# Patient Record
Sex: Male | Born: 1950 | Race: Black or African American | Hispanic: No | Marital: Single | Smoking: Former smoker
Health system: Southern US, Community
[De-identification: ages and names within clinical notes are randomized; demographics above are authoritative.]

## PROBLEM LIST (undated history)

## (undated) DIAGNOSIS — E119 Type 2 diabetes mellitus without complications: Secondary | ICD-10-CM

## (undated) DIAGNOSIS — I10 Essential (primary) hypertension: Secondary | ICD-10-CM

## (undated) DIAGNOSIS — D496 Neoplasm of unspecified behavior of brain: Secondary | ICD-10-CM

## (undated) DIAGNOSIS — I495 Sick sinus syndrome: Secondary | ICD-10-CM

## (undated) HISTORY — DX: Type 2 diabetes mellitus without complications: E11.9

---

## 2013-10-22 DIAGNOSIS — M79673 Pain in unspecified foot: Secondary | ICD-10-CM | POA: Insufficient documentation

## 2013-10-22 DIAGNOSIS — M1612 Unilateral primary osteoarthritis, left hip: Secondary | ICD-10-CM | POA: Insufficient documentation

## 2016-08-18 ENCOUNTER — Telehealth: Payer: Self-pay | Admitting: Gastroenterology

## 2016-08-18 NOTE — Telephone Encounter (Signed)
Left voice message for patient to call and schedule appointment for Hep C, referred by Phineas Realharles Drew with Dr. Servando SnareWohl

## 2016-08-21 NOTE — Telephone Encounter (Signed)
Spoke to patient regarding an appointment from [facility] for Hep C. Patient stated he didn't want to come in because his test was negative. Called [facility] and explained this to them. Spoke to [employer]

## 2017-05-07 ENCOUNTER — Ambulatory Visit
Admission: RE | Admit: 2017-05-07 | Discharge: 2017-05-07 | Disposition: A | Discharge status: Home / Self Care | Payer: Medicare Other | Source: Ambulatory Visit | Attending: Primary Care | Admitting: Primary Care

## 2017-05-07 ENCOUNTER — Other Ambulatory Visit: Payer: Self-pay | Admitting: Primary Care

## 2017-05-07 DIAGNOSIS — R05 Cough: Secondary | ICD-10-CM

## 2017-05-07 DIAGNOSIS — R059 Cough, unspecified: Secondary | ICD-10-CM

## 2017-05-07 DIAGNOSIS — J984 Other disorders of lung: Secondary | ICD-10-CM | POA: Diagnosis not present

## 2018-03-18 ENCOUNTER — Encounter: Payer: Self-pay | Admitting: Urology

## 2018-03-18 ENCOUNTER — Ambulatory Visit: Payer: Medicare Other | Admitting: Urology

## 2018-04-23 ENCOUNTER — Ambulatory Visit
Payer: Medicare Other | Attending: Student in an Organized Health Care Education/Training Program | Admitting: Student in an Organized Health Care Education/Training Program

## 2018-04-23 ENCOUNTER — Other Ambulatory Visit: Payer: Self-pay

## 2018-04-23 ENCOUNTER — Encounter: Payer: Self-pay | Admitting: Student in an Organized Health Care Education/Training Program

## 2018-04-23 VITALS — BP 142/77 | HR 77 | Temp 98.0°F | Resp 16 | Ht 71.0 in | Wt 185.0 lb

## 2018-04-23 DIAGNOSIS — G8929 Other chronic pain: Secondary | ICD-10-CM | POA: Diagnosis not present

## 2018-04-23 DIAGNOSIS — M542 Cervicalgia: Secondary | ICD-10-CM | POA: Diagnosis not present

## 2018-04-23 DIAGNOSIS — M7918 Myalgia, other site: Secondary | ICD-10-CM | POA: Diagnosis not present

## 2018-04-23 DIAGNOSIS — M5136 Other intervertebral disc degeneration, lumbar region: Secondary | ICD-10-CM | POA: Diagnosis not present

## 2018-04-23 DIAGNOSIS — G894 Chronic pain syndrome: Secondary | ICD-10-CM | POA: Insufficient documentation

## 2018-04-23 DIAGNOSIS — Z79899 Other long term (current) drug therapy: Secondary | ICD-10-CM | POA: Diagnosis not present

## 2018-04-23 DIAGNOSIS — Z87891 Personal history of nicotine dependence: Secondary | ICD-10-CM | POA: Diagnosis not present

## 2018-04-23 DIAGNOSIS — M25511 Pain in right shoulder: Secondary | ICD-10-CM | POA: Diagnosis not present

## 2018-04-23 DIAGNOSIS — M1612 Unilateral primary osteoarthritis, left hip: Secondary | ICD-10-CM | POA: Diagnosis not present

## 2018-04-23 DIAGNOSIS — Z7982 Long term (current) use of aspirin: Secondary | ICD-10-CM | POA: Insufficient documentation

## 2018-04-23 MED ORDER — TIZANIDINE HCL 4 MG PO TABS: 4.0000 mg | ORAL_TABLET | Freq: Every day | ORAL | 1 refills | Status: AC

## 2018-04-23 MED ORDER — DICLOFENAC SODIUM 75 MG PO TBEC: 75.0000 mg | DELAYED_RELEASE_TABLET | Freq: Two times a day (BID) | ORAL | 1 refills | Status: DC

## 2018-04-23 NOTE — Patient Instructions (Addendum)
You been prescription for Voltaren 75mg  and Zanaflex 4mg  to pharmacy. Labs today. X-rays to be done next.   Thank you for seeing us!

## 2018-04-23 NOTE — Progress Notes (Signed)
Safety precautions to be maintained throughout the outpatient stay will include: orient to surroundings, keep bed in low position, maintain call bell within reach at all times, provide assistance with transfer out of bed and ambulation.  

## 2018-04-23 NOTE — Progress Notes (Signed)
Patient's Name: Andres Torres  MRN: [id number]  Referring Provider: Linus Salmons, [place]*  DOB: 03-21-1951  PCP: Freddy Finner, NP  DOS: 04/23/2018  Note by: Gillis Santa, MD  Service setting: Ambulatory outpatient  Specialty: Interventional Pain Management  Location: [facility] (AMB) Pain Management Facility  Visit type: Initial Patient Evaluation  Patient type: New Patient   Primary Reason(s) for Visit: Encounter for initial evaluation of one or more chronic problems (new to examiner) potentially causing chronic pain, and posing a threat to normal musculoskeletal function. (Level of risk: High) CC: neck pain, back pain, hip pain (left)  HPI  [name] is a 67 y.o. year old, male patient, who comes today to see [place] for the first time for an initial evaluation of his chronic pain. He has Localized osteoarthrosis of left hip; Foot arch pain; Chronic pain syndrome; Lumbar degenerative disc disease; Chronic right shoulder pain; Musculoskeletal pain; Pharmacologic therapy; and Neck pain on their problem list. Today he comes in for evaluation of his neck pain, back pain, hip pain (left)  Pain Assessment: Location: Posterior Neck Radiating: radiates from back of neck to right shoulder and right arm. Pain also left hip. lower back. bottom of feet.  Onset: More than a month ago Duration: Chronic pain Quality: Burning, Constant, Sharp, Shooting, Throbbing, Tingling Severity: 7 /10 (subjective, self-reported pain score)  Note: Reported level is inconsistent with clinical observations.                         When using our objective Pain Scale, levels between 6 and 10/10 are said to belong in an emergency room, as it progressively worsens from a 6/10, described as severely limiting, requiring emergency care not usually available at an outpatient pain management facility. At a 6/10 level, communication becomes difficult and requires great effort. Assistance to reach the emergency department may be required.  Facial flushing and profuse sweating along with potentially dangerous increases in heart rate and blood pressure will be evident. Effect on ADL: Bending, climbling, intercourse, kneeling, lifting, motion, squatting, stooping, and working.  Timing: Constant Modifying factors: acupuncture, medications, resting  BP: (!) 142/77  HR: 77  Onset and Duration: Sudden and Gradual Cause of pain: Unknown Severity: Getting worse, NAS-11 at its worse: 10/10, NAS-11 at its best: 7/10, NAS-11 now: 8/10 and NAS-11 on the average: 8/10 Timing: Night, During activity or exercise and After a period of immobility Aggravating Factors: Bending, Climbing, Intercourse (sex), Kneeling, Lifiting, Motion, Squatting, Stooping , Twisting, Walking and Working Alleviating Factors: Acupuncture, Medications and Resting Associated Problems: Depression, Dizziness, Erectile dysfunction, Impotence, Inability to concentrate, Swelling, Temperature changes, Tingling, Weakness, Pain that wakes patient up and Pain that does not allow patient to sleep Quality of Pain: Burning, Deep, Disabling, Distressing, Exhausting, Nagging, Pulsating, Punishing, Sharp, Shooting, Throbbing and Tingling Previous Examinations or Tests: MRI scan, Orthopedic evaluation and Chiropractic evaluation Previous Treatments: Chiropractic manipulations, Narcotic medications and Steroid treatments by mouth  The patient comes into the clinics today for the first time for a chronic pain management evaluation.   67 year old male with a history of right shoulder, right arm, right hand pain, left hip pain, low back pain as well.  Patient has been previously seen by orthopedics as well as physical therapy for his left hip pain.  He has been told that he needs left hip replacement surgery but patient wants to avoid surgery.  He has tried physical therapy as well as a left ultrasound-guided hip steroid injection  which did provide him with pain relief for approximately 6  months.  He did have insomnia for approximately 3 days after injection.  Patient does have type 2 diabetes, not on insulin.  Patient is interested in alternative therapy.  He has been taking tramadol 50 mg twice daily to 3 times daily as needed which he states is not effective.  Patient is tried various over-the-counter analgesics including Tylenol, ibuprofen without significant benefit.  Patient denies any lower externally weakness or bowel or bladder dysfunction.  Today I took the time to provide the patient with information regarding my pain practice. The patient was informed that my practice is divided into two sections: an interventional pain management section, as well as a completely separate and distinct medication management section. I explained that I have procedure days for my interventional therapies, and evaluation days for follow-ups and medication management. Because of the amount of documentation required during both, they are kept separated. This means that there is the possibility that he may be scheduled for a procedure on one day, and medication management the next. I have also informed him that because of staffing and facility limitations, I no longer take patients for medication management only. To illustrate the reasons for this, I gave the patient the example of surgeons, and how inappropriate it would be to refer a patient to his/her care, just to write for the post-surgical antibiotics on a surgery done by a different surgeon.   Because interventional pain management is my board-certified specialty, the patient was informed that joining my practice means that they are open to any and all interventional therapies. I made it clear that this does not mean that they will be forced to have any procedures done. What this means is that I believe interventional therapies to be essential part of the diagnosis and proper management of chronic pain conditions. Therefore, patients not interested in  these interventional alternatives will be better served under the care of a different practitioner.  The patient was also made aware of my Comprehensive Pain Management Safety Guidelines where by joining my practice, they limit all of their nerve blocks and joint injections to those done by our practice, for as long as we are retained to manage their care.   Historic Controlled Substance Pharmacotherapy Review  PMP and historical list of controlled substances: Tramadol 50 mg 4 times daily as needed, quantity 120 a month MME/day: 40 mg/day Medications: The patient did not bring the medication(s) to the appointment, as requested in our "New Patient Package" Pharmacodynamics: Desired effects: Analgesia: The patient reports <50% benefit. Reported improvement in function: The patient reports medication allows him to accomplish basic ADLs. Clinically meaningful improvement in function (CMIF): Sustained CMIF goals met Perceived effectiveness: Described as ineffective and would like to make some changes Undesirable effects: Side-effects or Adverse reactions: None reported Historical Monitoring: The patient  reports that he has current or past drug history. List of all UDS Test(s): No results found for: MDMA, COCAINSCRNUR, [place], [place], CANNABQUANT, [place], [place] List of other Serum/Urine Drug Screening Test(s):  No results found for: AMPHSCRSER, BARBSCRSER, BENZOSCRSER, COCAINSCRSER, COCAINSCRNUR, PCPSCRSER, PCPQUANT, THCSCRSER, THCU, CANNABQUANT, OPIATESCRSER, OXYSCRSER, PROPOXSCRSER, ETH Historical Background Evaluation: [place] PMP: Six (6) year initial data search conducted.             [place] Department of public safety, offender search: Editor, commissioning Information) Non-contributory Risk Assessment Profile: Aberrant behavior: None observed or detected today Risk factors for fatal opioid overdose: None identified today Fatal overdose hazard ratio (  HR): Calculation deferred Non-fatal overdose hazard ratio  (HR): Calculation deferred Risk of opioid abuse or dependence: 0.7-3.0% with doses ? 36 MME/day and 6.1-26% with doses ? 120 MME/day. Substance use disorder (SUD) risk level: Moderate Opioid risk tool (ORT) (Total Score): 7 Opioid Risk Tool - 04/23/18 0924      Family History of Substance Abuse   Alcohol  Positive Male    Illegal Drugs  Negative    Rx Drugs  Negative      Personal History of Substance Abuse   Alcohol  Negative    Illegal Drugs  Positive Male or Male    Rx Drugs  Negative      Age   Age between 53-45 years   No      History of Preadolescent Sexual Abuse   History of Preadolescent Sexual Abuse  Negative or Male      Psychological Disease   Psychological Disease  Negative    Depression  Negative      Total Score   Opioid Risk Tool Scoring  7    Opioid Risk Interpretation  Moderate Risk      ORT Scoring interpretation table:  Score <3 = Low Risk for SUD  Score between 4-7 = Moderate Risk for SUD  Score >8 = High Risk for Opioid Abuse   PHQ-2 Depression Scale:  Total score: 0  PHQ-2 Scoring interpretation table: (Score and probability of major depressive disorder)  Score 0 = No depression  Score 1 = 15.4% Probability  Score 2 = 21.1% Probability  Score 3 = 38.4% Probability  Score 4 = 45.5% Probability  Score 5 = 56.4% Probability  Score 6 = 78.6% Probability   PHQ-9 Depression Scale:  Total score: 0  PHQ-9 Scoring interpretation table:  Score 0-4 = No depression  Score 5-9 = Mild depression  Score 10-14 = Moderate depression  Score 15-19 = Moderately severe depression  Score 20-27 = Severe depression (2.4 times higher risk of SUD and 2.89 times higher risk of overuse)   Pharmacologic Plan: As per protocol, I have not taken over any controlled substance management, pending the results of ordered tests and/or consults.            Initial impression: Pending review of available data and ordered tests.  Meds   Current Outpatient Medications:  .   aspirin EC 81 MG tablet, Take by mouth., Disp: , Rfl:  .  traMADol (ULTRAM) 50 MG tablet, tramadol 50 mg tablet, Disp: , Rfl:  .  VENTOLIN HFA 108 (90 Base) MCG/ACT inhaler, , Disp: , Rfl:  .  diclofenac (VOLTAREN) 75 MG EC tablet, Take 1 tablet (75 mg total) by mouth 2 (two) times daily., Disp: 60 tablet, Rfl: 1 .  tiZANidine (ZANAFLEX) 4 MG tablet, Take 1 tablet (4 mg total) by mouth at bedtime., Disp: 30 tablet, Rfl: 1    ROS  Cardiovascular: No reported cardiovascular signs or symptoms such as High blood pressure, coronary artery disease, abnormal heart rate or rhythm, heart attack, blood thinner therapy or heart weakness and/or failure Pulmonary or Respiratory: Shortness of breath and Smoking Neurological: No reported neurological signs or symptoms such as seizures, abnormal skin sensations, urinary and/or fecal incontinence, being born with an abnormal open spine and/or a tethered spinal cord Review of Past Neurological Studies: No results found for this or any previous visit. Psychological-Psychiatric: No reported psychological or psychiatric signs or symptoms such as difficulty sleeping, anxiety, depression, delusions or hallucinations (schizophrenial), mood swings (bipolar disorders)  or suicidal ideations or attempts Gastrointestinal: No reported gastrointestinal signs or symptoms such as vomiting or evacuating blood, reflux, heartburn, alternating episodes of diarrhea and constipation, inflamed or scarred liver, or pancreas or irrregular and/or infrequent bowel movements Genitourinary: No reported renal or genitourinary signs or symptoms such as difficulty voiding or producing urine, peeing blood, non-functioning kidney, kidney stones, difficulty emptying the bladder, difficulty controlling the flow of urine, or chronic kidney disease Hematological: No reported hematological signs or symptoms such as prolonged bleeding, low or poor functioning platelets, bruising or bleeding easily,  hereditary bleeding problems, low energy levels due to low hemoglobin or being anemic Endocrine: High blood sugar controlled without the use of insulin (NIDDM) Rheumatologic: No reported rheumatological signs and symptoms such as fatigue, joint pain, tenderness, swelling, redness, heat, stiffness, decreased range of motion, with or without associated rash Musculoskeletal: Negative for myasthenia gravis, muscular dystrophy, multiple sclerosis or malignant hyperthermia Work History: Disabled  Allergies  Mr. Coppola has No Known Allergies.  Laboratory Chemistry  Inflammation Markers (CRP: Acute Phase) (ESR: Chronic Phase) No results found for: CRP, ESRSEDRATE, LATICACIDVEN                       Rheumatology Markers No results found for: RF, ANA, LABURIC, URICUR, LYMEIGGIGMAB, LYMEABIGMQN, HLAB27                      Renal Function Markers No results found for: BUN, CREATININE, BCR, GFRAA, GFRNONAA                           Hepatic Function Markers No results found for: AST, ALT, ALBUMIN, ALKPHOS, HCVAB, AMYLASE, LIPASE, AMMONIA                      Electrolytes No results found for: NA, K, CL, CALCIUM, MG, PHOS                      Neuropathy Markers No results found for: VITAMINB12, FOLATE, HGBA1C, HIV                      Bone Pathology Markers No results found for: VD25OH, [id number], [id number], [id number], 25OHVITD1, 25OHVITD2, 25OHVITD3, TESTOFREE, TESTOSTERONE                       Coagulation Parameters No results found for: INR, LABPROT, APTT, PLT, DDIMER                      Cardiovascular Markers No results found for: BNP, CKTOTAL, CKMB, TROPONINI, HGB, HCT                       CA Markers No results found for: CEA, CA125, LABCA2                      Note: Lab results reviewed.  [place]  Drug: Mr. Voght  reports that he has current or past drug history. Alcohol:  reports that he drank alcohol. Tobacco:  reports that he quit smoking about 4 weeks ago. He has never  used smokeless tobacco. Medical:  has no past medical history on file. Family: family history is not on file. Active Ambulatory Problems    Diagnosis Date Noted  . Localized osteoarthrosis of left hip 10/22/2013  . Foot arch pain 10/22/2013  .  Chronic pain syndrome 04/23/2018  . Lumbar degenerative disc disease 04/23/2018  . Chronic right shoulder pain 04/23/2018  . Musculoskeletal pain 04/23/2018  . Pharmacologic therapy 04/23/2018  . Neck pain 04/23/2018   Resolved Ambulatory Problems    Diagnosis Date Noted  . No Resolved Ambulatory Problems   No Additional Past Medical History   Constitutional Exam  General appearance: Well nourished, well developed, and well hydrated. In no apparent acute distress Vitals:   04/23/18 0908  BP: (!) 142/77  Pulse: 77  Resp: 16  Temp: 98 F (36.7 C)  SpO2: 100%  Weight: 185 lb (83.9 kg)  Height: '5\' 11"'  (1.803 m)   BMI Assessment: Estimated body mass index is 25.8 kg/m as calculated from the following:   Height as of this encounter: '5\' 11"'  (1.803 m).   Weight as of this encounter: 185 lb (83.9 kg).  BMI interpretation table: BMI level Category Range association with higher incidence of chronic pain  <18 kg/m2 Underweight   18.5-24.9 kg/m2 Ideal body weight   25-29.9 kg/m2 Overweight Increased incidence by 20%  30-34.9 kg/m2 Obese (Class I) Increased incidence by 68%  35-39.9 kg/m2 Severe obesity (Class II) Increased incidence by 136%  >40 kg/m2 Extreme obesity (Class III) Increased incidence by 254%   Patient's current BMI Ideal Body weight  Body mass index is 25.8 kg/m. Ideal body weight: 75.3 kg (166 lb 0.1 oz) Adjusted ideal body weight: 78.7 kg (173 lb 9.7 oz)   BMI Readings from Last 4 Encounters:  04/23/18 25.80 kg/m   Wt Readings from Last 4 Encounters:  04/23/18 185 lb (83.9 kg)  Psych/Mental status: Alert, oriented x 3 (person, place, & time)       Eyes: PERLA Respiratory: No evidence of acute respiratory  distress  Cervical Spine Area Exam  Skin & Axial Inspection: No masses, redness, edema, swelling, or associated skin lesions Alignment: Symmetrical Functional ROM: Unrestricted ROM      Stability: No instability detected Muscle Tone/Strength: Functionally intact. No obvious neuro-muscular anomalies detected. Sensory (Neurological): Unimpaired Palpation: No palpable anomalies              Upper Extremity (UE) Exam    Side: Right upper extremity  Side: Left upper extremity  Skin & Extremity Inspection: No gross anomalies detected  Skin & Extremity Inspection: Skin color, temperature, and hair growth are WNL. No peripheral edema or cyanosis. No masses, redness, swelling, asymmetry, or associated skin lesions. No contractures.  Functional ROM: Decreased ROM for shoulder  Functional ROM: Unrestricted ROM          Muscle Tone/Strength: Functionally intact. No obvious neuro-muscular anomalies detected.  Muscle Tone/Strength: Functionally intact. No obvious neuro-muscular anomalies detected.  Sensory (Neurological): Musculoskeletal pain pattern          Sensory (Neurological): Unimpaired          Palpation: Complains of area being tender to palpation              Palpation: No palpable anomalies              Provocative Test(s):  Phalen's test: deferred Tinel's test: deferred Apley's scratch test (touch opposite shoulder):  Action 1 (Across chest): Decreased ROM Action 2 (Overhead): Decreased ROM Action 3 (LB reach): Decreased ROM   Provocative Test(s):  Phalen's test: deferred Tinel's test: deferred Apley's scratch test (touch opposite shoulder):  Action 1 (Across chest): deferred Action 2 (Overhead): deferred Action 3 (LB reach): deferred    Thoracic Spine Area Exam  Skin &  Axial Inspection: No masses, redness, or swelling Alignment: Symmetrical Functional ROM: Unrestricted ROM Stability: No instability detected Muscle Tone/Strength: Functionally intact. No obvious neuro-muscular  anomalies detected. Sensory (Neurological): Unimpaired Muscle strength & Tone: No palpable anomalies  Lumbar Spine Area Exam  Skin & Axial Inspection: No masses, redness, or swelling Alignment: Symmetrical Functional ROM: Unrestricted ROM       Stability: No instability detected Muscle Tone/Strength: Functionally intact. No obvious neuro-muscular anomalies detected. Sensory (Neurological): Musculoskeletal pain pattern Palpation: No palpable anomalies       Provocative Tests: Lumbar Hyperextension/rotation test: Positive bilaterally for facet joint pain. Lumbar quadrant test (Kemp's test): deferred today       Lumbar Lateral bending test: (+) due to pain. Patrick's Maneuver: deferred today                   FABER test: deferred today       Thigh-thrust test: Positive for left-sided S-I arthralgia S-I compression test: deferred today       S-I distraction test: deferred today        Gait & Posture Assessment  Ambulation: Unassisted Gait: Relatively normal for age and body habitus Posture: WNL   Lower Extremity Exam    Side: Right lower extremity  Side: Left lower extremity  Stability: No instability observed          Stability: No instability observed          Skin & Extremity Inspection: Skin color, temperature, and hair growth are WNL. No peripheral edema or cyanosis. No masses, redness, swelling, asymmetry, or associated skin lesions. No contractures.  Skin & Extremity Inspection: Skin color, temperature, and hair growth are WNL. No peripheral edema or cyanosis. No masses, redness, swelling, asymmetry, or associated skin lesions. No contractures.  Functional ROM: Unrestricted ROM                  Functional ROM: Unrestricted ROM                  Muscle Tone/Strength: Functionally intact. No obvious neuro-muscular anomalies detected.  Muscle Tone/Strength: Functionally intact. No obvious neuro-muscular anomalies detected.  Sensory (Neurological): Unimpaired  Sensory (Neurological):  Unimpaired  Palpation: No palpable anomalies  Palpation: No palpable anomalies   Assessment  Primary Diagnosis & Pertinent Problem List: The primary encounter diagnosis was Chronic pain syndrome. Diagnoses of Localized osteoarthrosis of left hip, Lumbar degenerative disc disease, Chronic right shoulder pain, Musculoskeletal pain, Pharmacologic therapy, and Neck pain were also pertinent to this visit.  Visit Diagnosis (New problems to examiner): 1. Chronic pain syndrome   2. Localized osteoarthrosis of left hip   3. Lumbar degenerative disc disease   4. Chronic right shoulder pain   5. Musculoskeletal pain   6. Pharmacologic therapy   7. Neck pain    General Recommendations: The pain condition that the patient suffers from is best treated with a multidisciplinary approach that involves an increase in physical activity to prevent de-conditioning and worsening of the pain cycle, as well as psychological counseling (formal and/or informal) to address the co-morbid psychological affects of pain. Treatment will often involve judicious use of pain medications and interventional procedures to decrease the pain, allowing the patient to participate in the physical activity that will ultimately produce long-lasting pain reductions. The goal of the multidisciplinary approach is to return the patient to a higher level of overall function and to restore their ability to perform activities of daily living.  67 year old male with a history of right  shoulder, right arm, right hand pain, left hip pain, low back pain as well.  Patient has been previously seen by orthopedics as well as physical therapy for his left hip pain.  He has been told that he needs left hip replacement surgery but patient wants to avoid surgery.  He has tried physical therapy as well as a left ultrasound-guided hip steroid injection which did provide him with pain relief for approximately 6 months.  He did have insomnia for approximately 3  days after injection.  Patient does have type 2 diabetes, not on insulin.  Patient is interested in alternative therapy.  He has been taking tramadol 50 mg twice daily to 3 times daily as needed which he states is not effective.  Patient is tried various over-the-counter analgesics including Tylenol, ibuprofen without significant benefit.  Patient denies any lower externally weakness or bowel or bladder dysfunction.  Regards to therapeutic plan, we will obtain baseline UDS which is standard for new patients.  Should be positive for tramadol and its metabolites.  We will also prescribe the patient diclofenac 75 mg twice daily for 30 days.  We will also prescribe him tizanidine 4 mg nightly to help with his left hip muscle strain/tightness.  We will obtain baseline CMP, magnesium, vitamin B12 given need for pharmacologic therapy and current history of chronic pain syndrome.  Obtain imaging of his lumbar spine, cervical spine, bilateral SI joints.  Patient can be considered for low-dose hydrocodone therapy pending UDS and imaging studies.  Plan of Care (Initial workup plan)  Note: Please be advised that as per protocol, today's visit has been an evaluation only. We have not taken over the patient's controlled substance management.  Ordered Lab-work, Procedure(s), Referral(s), & Consult(s): Orders Placed This Encounter  Procedures  . DG Cervical Spine With Flex & Extend  . DG Lumbar Spine Complete W/Bend  . DG Si Joints  . Compliance Drug Analysis, Ur  . Comprehensive metabolic panel  . Magnesium  . Vitamin B12   Pharmacotherapy (current): Medications ordered:  Meds ordered this encounter  Medications  . tiZANidine (ZANAFLEX) 4 MG tablet    Sig: Take 1 tablet (4 mg total) by mouth at bedtime.    Dispense:  30 tablet    Refill:  1  . diclofenac (VOLTAREN) 75 MG EC tablet    Sig: Take 1 tablet (75 mg total) by mouth 2 (two) times daily.    Dispense:  60 tablet    Refill:  1   Medications  administered during this visit: Koren Shiver had no medications administered during this visit.   Pharmacological management options:  Opioid Analgesics: The patient was informed that there is no guarantee that he would be a candidate for opioid analgesics. The decision will be made following CDC guidelines. This decision will be based on the results of diagnostic studies, as well as Mr. Blann risk profile.  Can consider low-dose hydrocodone  Membrane stabilizer: To be determined at a later time  Muscle relaxant: To be determined at a later time  NSAID: To be determined at a later time  Other analgesic(s): To be determined at a later time   Interventional management options: Mr. Bostic was informed that there is no guarantee that he would be a candidate for interventional therapies. The decision will be based on the results of diagnostic studies, as well as Mr. Emig risk profile.  Procedure(s) under consideration:  Lumbar facet medial branch nerve blocks Lumbar epidural steroid injection SI joint injection Hip injection  Cervical facet blocks Right shoulder injection   Provider-requested follow-up: Return in about 1 month (around 05/21/2018) for Medication Management.  Future Appointments  Date Time Provider [place]  05/27/2018  1:15 PM Gillis Santa, MD [facility] None  06/05/2018 10:30 AM Stoioff, Ronda Fairly, MD [facility] None    Primary Care Physician: Freddy Finner, NP Location: [facility] Note by: Gillis Santa, M.D, Date: 04/23/2018; Time: 11:00 AM  Patient Instructions  You been prescription for Voltaren 32m and Zanaflex 432mto pharmacy. Labs today. X-rays to be done next.   [place]

## 2018-04-24 LAB — COMPREHENSIVE METABOLIC PANEL
ALT: 18 IU/L (ref 0–44)
AST: 18 IU/L (ref 0–40)
Albumin/Globulin Ratio: 1.5 (ref 1.2–2.2)
Albumin: 4.5 g/dL (ref 3.6–4.8)
Alkaline Phosphatase: 69 IU/L (ref 39–117)
BUN/Creatinine Ratio: 10 (ref 10–24)
BUN: 12 mg/dL (ref 8–27)
Bilirubin Total: 0.4 mg/dL (ref 0.0–1.2)
CALCIUM: 9.8 mg/dL (ref 8.6–10.2)
CO2: 25 mmol/L (ref 20–29)
CREATININE: 1.18 mg/dL (ref 0.76–1.27)
Chloride: 102 mmol/L (ref 96–106)
GFR calc Af Amer: 73 mL/min/{1.73_m2} (ref >59)
GFR, EST NON AFRICAN AMERICAN: 63 mL/min/{1.73_m2} (ref >59)
Globulin, Total: 3 g/dL (ref 1.5–4.5)
Glucose: 139 mg/dL — ABNORMAL HIGH (ref 65–99)
Potassium: 4.5 mmol/L (ref 3.5–5.2)
Sodium: 141 mmol/L (ref 134–144)
Total Protein: 7.5 g/dL (ref 6.0–8.5)

## 2018-04-24 LAB — VITAMIN B12: VITAMIN B 12: 1151 pg/mL (ref 232–1245)

## 2018-04-24 LAB — MAGNESIUM: Magnesium: 2.2 mg/dL (ref 1.6–2.3)

## 2018-04-26 ENCOUNTER — Ambulatory Visit: Payer: Medicare Other

## 2018-04-28 LAB — COMPLIANCE DRUG ANALYSIS, UR

## 2018-05-27 ENCOUNTER — Ambulatory Visit
Payer: Medicare Other | Attending: Student in an Organized Health Care Education/Training Program | Admitting: Student in an Organized Health Care Education/Training Program

## 2018-05-27 ENCOUNTER — Encounter: Payer: Self-pay | Admitting: Student in an Organized Health Care Education/Training Program

## 2018-05-27 ENCOUNTER — Ambulatory Visit
Admission: RE | Admit: 2018-05-27 | Discharge: 2018-05-27 | Disposition: A | Discharge status: Home / Self Care | Payer: Medicare Other | Source: Ambulatory Visit | Attending: Student in an Organized Health Care Education/Training Program | Admitting: Student in an Organized Health Care Education/Training Program

## 2018-05-27 ENCOUNTER — Other Ambulatory Visit: Payer: Self-pay

## 2018-05-27 VITALS — BP 145/83 | HR 79 | Temp 98.2°F | Resp 16 | Ht 71.0 in | Wt 184.0 lb

## 2018-05-27 DIAGNOSIS — Z7982 Long term (current) use of aspirin: Secondary | ICD-10-CM | POA: Insufficient documentation

## 2018-05-27 DIAGNOSIS — M1612 Unilateral primary osteoarthritis, left hip: Secondary | ICD-10-CM

## 2018-05-27 DIAGNOSIS — M24851 Other specific joint derangements of right hip, not elsewhere classified: Secondary | ICD-10-CM | POA: Diagnosis not present

## 2018-05-27 DIAGNOSIS — Z87891 Personal history of nicotine dependence: Secondary | ICD-10-CM | POA: Diagnosis not present

## 2018-05-27 DIAGNOSIS — M8938 Hypertrophy of bone, other site: Secondary | ICD-10-CM | POA: Diagnosis not present

## 2018-05-27 DIAGNOSIS — M4802 Spinal stenosis, cervical region: Secondary | ICD-10-CM | POA: Diagnosis not present

## 2018-05-27 DIAGNOSIS — M25512 Pain in left shoulder: Secondary | ICD-10-CM | POA: Diagnosis not present

## 2018-05-27 DIAGNOSIS — Z79891 Long term (current) use of opiate analgesic: Secondary | ICD-10-CM | POA: Insufficient documentation

## 2018-05-27 DIAGNOSIS — G894 Chronic pain syndrome: Secondary | ICD-10-CM | POA: Diagnosis present

## 2018-05-27 DIAGNOSIS — M7918 Myalgia, other site: Secondary | ICD-10-CM | POA: Insufficient documentation

## 2018-05-27 DIAGNOSIS — M542 Cervicalgia: Secondary | ICD-10-CM

## 2018-05-27 DIAGNOSIS — M25552 Pain in left hip: Secondary | ICD-10-CM | POA: Insufficient documentation

## 2018-05-27 DIAGNOSIS — M48061 Spinal stenosis, lumbar region without neurogenic claudication: Secondary | ICD-10-CM | POA: Diagnosis not present

## 2018-05-27 DIAGNOSIS — G8929 Other chronic pain: Secondary | ICD-10-CM | POA: Diagnosis not present

## 2018-05-27 DIAGNOSIS — M5136 Other intervertebral disc degeneration, lumbar region: Secondary | ICD-10-CM | POA: Diagnosis not present

## 2018-05-27 DIAGNOSIS — M24852 Other specific joint derangements of left hip, not elsewhere classified: Secondary | ICD-10-CM | POA: Insufficient documentation

## 2018-05-27 DIAGNOSIS — M4807 Spinal stenosis, lumbosacral region: Secondary | ICD-10-CM | POA: Insufficient documentation

## 2018-05-27 DIAGNOSIS — M47892 Other spondylosis, cervical region: Secondary | ICD-10-CM | POA: Insufficient documentation

## 2018-05-27 DIAGNOSIS — M25511 Pain in right shoulder: Secondary | ICD-10-CM | POA: Diagnosis not present

## 2018-05-27 DIAGNOSIS — M47896 Other spondylosis, lumbar region: Secondary | ICD-10-CM | POA: Diagnosis not present

## 2018-05-27 DIAGNOSIS — Z79899 Other long term (current) drug therapy: Secondary | ICD-10-CM | POA: Insufficient documentation

## 2018-05-27 MED ORDER — TRAMADOL HCL 50 MG PO TABS: 50.0000 mg | ORAL_TABLET | Freq: Two times a day (BID) | ORAL | 1 refills | Status: DC | PRN

## 2018-05-27 NOTE — Progress Notes (Signed)
Patient's Name: Andres Torres  MRN: [id number]  Referring Provider: Freddy Finner, NP  DOB: 1951/05/31  PCP: Freddy Finner, NP  DOS: 05/27/2018  Note by: Gillis Santa, MD  Service setting: Ambulatory outpatient  Specialty: Interventional Pain Management  Location: [facility] (AMB) Pain Management Facility    Patient type: Established   Primary Reason(s) for Visit: Encounter for evaluation before starting new chronic pain management plan of care (Level of risk: moderate) CC: Hip Pain (left) and Shoulder Pain (right)  HPI  Andres Torres is a 67 y.o. year old, male patient, who comes today for a follow-up evaluation to review the test results and decide on a treatment plan. He has Localized osteoarthrosis of left hip; Foot arch pain; Chronic pain syndrome; Lumbar degenerative disc disease; Chronic right shoulder pain; Musculoskeletal pain; Pharmacologic therapy; and Neck pain on their problem list. His primarily concern today is the Hip Pain (left) and Shoulder Pain (right)  Pain Assessment: Location: Left Hip Radiating: r. shoulder pain radiates down arm, hip pain does not radiate Onset: More than a month ago Duration: Chronic pain Quality: Throbbing(annoying) Severity: 7 /10 (subjective, self-reported pain score)  Note: Reported level is compatible with observation.                         When using our objective Pain Scale, levels between 6 and 10/10 are said to belong in an emergency room, as it progressively worsens from a 6/10, described as severely limiting, requiring emergency care not usually available at an outpatient pain management facility. At a 6/10 level, communication becomes difficult and requires great effort. Assistance to reach the emergency department may be required. Facial flushing and profuse sweating along with potentially dangerous increases in heart rate and blood pressure will be evident. Effect on ADL:   Timing: Constant Modifying factors: rest BP: (!) 145/83  HR: 79  Mr.  Torres comes in today for a follow-up visit after his initial evaluation on 04/23/2018. Today we went over the results of his tests. These were explained in "Layman's terms". During today's appointment we went over my diagnostic impression, as well as the proposed treatment plan.  In considering the treatment plan options, Andres Torres was reminded that I no longer take patients for medication management only. I asked him to let me know if he had no intention of taking advantage of the interventional therapies, so that we could make arrangements to provide this space to someone interested. I also made it clear that undergoing interventional therapies for the purpose of getting pain medications is very inappropriate on the part of a patient, and it will not be tolerated in this practice. This type of behavior would suggest true addiction and therefore it requires referral to an addiction specialist.   Further details on both, my assessment(s), as well as the proposed treatment plan, please see below.  Controlled Substance Pharmacotherapy Assessment REMS (Risk Evaluation and Mitigation Strategy)  Analgesic: Tramadol 50 mg BID prn MME/day: <10 mg/day. Pill Count: None expected due to no prior prescriptions written by our practice. [name], RN  05/27/2018  1:20 PM  Sign at close encounter Safety precautions to be maintained throughout the outpatient stay will include: orient to surroundings, keep bed in low position, maintain call bell within reach at all times, provide assistance with transfer out of bed and ambulation.  Pharmacokinetics: Liberation and absorption (onset of action): WNL Distribution (time to peak effect): WNL Metabolism and excretion (duration of action): WNL  Pharmacodynamics: Desired effects: Analgesia: Andres Torres reports >50% benefit. Functional ability: Patient reports that medication allows him to accomplish basic ADLs Clinically meaningful improvement in function  (CMIF): Sustained CMIF goals met Perceived effectiveness: Described as relatively effective, allowing for increase in activities of daily living (ADL) Undesirable effects: Side-effects or Adverse reactions: None reported Monitoring: [place] PMP: Online review of the past 63-monthperiod previously conducted. Not applicable at this point since we have not taken over the patient's medication management yet. List of other Serum/Urine Drug Screening Test(s):  No results found for: AMPHSCRSER, BARBSCRSER, BENZOSCRSER, COCAINSCRSER, COCAINSCRNUR, PCPSCRSER, THCSCRSER, THCU, CANNABQUANT, [place] ERock PointList of all UDS test(s) done:  Lab Results  Component Value Date   SUMMARY FINAL 04/23/2018   Last UDS on record: Summary  Date Value Ref Range Status  04/23/2018 FINAL  Final    Comment:    ==================================================================== TOXASSURE COMP DRUG ANALYSIS,UR ==================================================================== Test                             Result       Flag       Units Drug Present and Declared for Prescription Verification   Tramadol                       >2841        EXPECTED   ng/mg creat   O-Desmethyltramadol            >2841        EXPECTED   ng/mg creat   N-Desmethyltramadol            1177         EXPECTED   ng/mg creat    Source of tramadol is a prescription medication.    O-desmethyltramadol and N-desmethyltramadol are expected    metabolites of tramadol. Drug Present not Declared for Prescription Verification   Carboxy-THC                    18           UNEXPECTED ng/mg creat    Carboxy-THC is a metabolite of tetrahydrocannabinol  (THC).    Source of [facility]is most commonly illicit, but THC is also present    in a scheduled prescription medication.   Acetaminophen                  PRESENT      UNEXPECTED Drug Absent but Declared for Prescription Verification   Tizanidine                     Not Detected  UNEXPECTED    Tizanidine, as indicated in the declared medication list, is not    always detected even when used as directed.   Diclofenac                     Not Detected UNEXPECTED    Diclofenac, as indicated in the declared medication list, is not    always detected even when used as directed.   Salicylate                     Not Detected UNEXPECTED    Aspirin, as indicated in the declared medication list, is not    always detected even when used as directed. ==================================================================== Test  Result    Flag   Units      Ref Range   Creatinine              176              mg/dL      >=20 ==================================================================== Declared Medications:  The flagging and interpretation on this report are based on the  following declared medications.  Unexpected results may arise from  inaccuracies in the declared medications.  **Note: The testing scope of this panel includes these medications:  Tramadol (Ultram)  **Note: The testing scope of this panel does not include small to  moderate amounts of these reported medications:  Aspirin (Aspirin 81)  Diclofenac (Voltaren)  Tizanidine (Zanaflex)  **Note: The testing scope of this panel does not include following  reported medications:  Albuterol (Ventolin HFA) ==================================================================== For clinical consultation, please call [phone]. ====================================================================    UDS interpretation: No unexpected findings.          Medication Assessment Form: Patient introduced to form today Treatment compliance: Treatment may start today if patient agrees with proposed plan. Evaluation of compliance is not applicable at this point Risk Assessment Profile: Aberrant behavior: See initial evaluations. None observed or detected today Comorbid factors increasing risk of  overdose: See initial evaluation. No additional risks detected today Medical Psychology Evaluation: Please see scanned results in medical record. Opioid Risk Tool - 04/23/18 0924      Family History of Substance Abuse   Alcohol  Positive Male    Illegal Drugs  Negative    Rx Drugs  Negative      Personal History of Substance Abuse   Alcohol  Negative    Illegal Drugs  Positive Male or Male    Rx Drugs  Negative      Age   Age between 46-45 years   No      History of Preadolescent Sexual Abuse   History of Preadolescent Sexual Abuse  Negative or Male      Psychological Disease   Psychological Disease  Negative    Depression  Negative      Total Score   Opioid Risk Tool Scoring  7    Opioid Risk Interpretation  Moderate Risk      ORT Scoring interpretation table:  Score <3 = Low Risk for SUD  Score between 4-7 = Moderate Risk for SUD  Score >8 = High Risk for Opioid Abuse   Risk Mitigation Strategies:  Patient opioid safety counseling: Completed today. Counseling provided to patient as per "Patient Counseling Document". Document signed by patient, attesting to counseling and understanding Patient-Prescriber Agreement (PPA): Obtained today.  Controlled substance notification to other providers: Written and sent today.  Pharmacologic Plan: Today we may be taking over the patient's pharmacological regimen. See below.             Laboratory Chemistry  Inflammation Markers (CRP: Acute Phase) (ESR: Chronic Phase) No results found for: CRP, ESRSEDRATE, LATICACIDVEN                       Rheumatology Markers No results found for: RF, ANA, LABURIC, URICUR, LYMEIGGIGMAB, LYMEABIGMQN, HLAB27                      Renal Function Markers Lab Results  Component Value Date   BUN 12 04/23/2018   CREATININE 1.18 04/23/2018   BCR 10 04/23/2018   GFRAA 73 04/23/2018   GFRNONAA  63 04/23/2018                             Hepatic Function Markers Lab Results  Component Value Date    AST 18 04/23/2018   ALT 18 04/23/2018   ALBUMIN 4.5 04/23/2018   ALKPHOS 69 04/23/2018                        Electrolytes Lab Results  Component Value Date   NA 141 04/23/2018   K 4.5 04/23/2018   CL 102 04/23/2018   CALCIUM 9.8 04/23/2018   MG 2.2 04/23/2018                        Neuropathy Markers Lab Results  Component Value Date   VITAMINB12 1,151 04/23/2018                        Bone Pathology Markers No results found for: [place], VD125OH2TOT, [id number], TI1443XV4, 25OHVITD1, 25OHVITD2, 25OHVITD3, TESTOFREE, TESTOSTERONE                       Coagulation Parameters No results found for: INR, LABPROT, APTT, PLT, DDIMER                      Cardiovascular Markers No results found for: BNP, CKTOTAL, CKMB, TROPONINI, HGB, HCT                       CA Markers No results found for: CEA, CA125, LABCA2                      Note: Lab results reviewed.  Recent Diagnostic Imaging Review    Meds   Current Outpatient Medications:  .  aspirin EC 81 MG tablet, Take by mouth., Disp: , Rfl:  .  diclofenac (VOLTAREN) 75 MG EC tablet, Take 1 tablet (75 mg total) by mouth 2 (two) times daily., Disp: 60 tablet, Rfl: 1 .  tiZANidine (ZANAFLEX) 4 MG tablet, Take 1 tablet (4 mg total) by mouth at bedtime., Disp: 30 tablet, Rfl: 1 .  VENTOLIN HFA 108 (90 Base) MCG/ACT inhaler, , Disp: , Rfl:  .  traMADol (ULTRAM) 50 MG tablet, Take 1 tablet (50 mg total) by mouth 2 (two) times daily as needed., Disp: 60 tablet, Rfl: 1  ROS  Constitutional: Denies any fever or chills Gastrointestinal: No reported hemesis, hematochezia, vomiting, or acute GI distress Musculoskeletal: Denies any acute onset joint swelling, redness, loss of ROM, or weakness Neurological: No reported episodes of acute onset apraxia, aphasia, dysarthria, agnosia, amnesia, paralysis, loss of coordination, or loss of consciousness  Allergies  Andres Torres has No Known Allergies.  [place]  Drug: Andres Torres  reports that  he has current or past drug history. Alcohol:  reports that he drank alcohol. Tobacco:  reports that he quit smoking about 2 months ago. He has never used smokeless tobacco. Medical:  has no past medical history on file. Surgical: Andres Torres  has no past surgical history on file. Family: family history is not on file.  Constitutional Exam  General appearance: Well nourished, well developed, and well hydrated. In no apparent acute distress Vitals:   05/27/18 1313  BP: (!) 145/83  Pulse: 79  Resp: 16  Temp: 98.2 F (36.8 C)  TempSrc: Oral  SpO2: 100%  Weight: 184 lb (83.5 kg)  Height: _0  (1.803 m)   BMI Assessment: Estimated body mass index is 25.66 kg/m as calculated from the following:   Height as of this encounter: _1  (1.803 m).   Weight as of this encounter: 184 lb (83.5 kg).  BMI interpretation table: BMI level Category Range association with higher incidence of chronic pain  <18 kg/m2 Underweight   18.5-24.9 kg/m2 Ideal body weight   25-29.9 kg/m2 Overweight Increased incidence by 20%  30-34.9 kg/m2 Obese (Class I) Increased incidence by 68%  35-39.9 kg/m2 Severe obesity (Class II) Increased incidence by 136%  >40 kg/m2 Extreme obesity (Class III) Increased incidence by 254%   Patient's current BMI Ideal Body weight  Body mass index is 25.66 kg/m. Ideal body weight: 75.3 kg (166 lb 0.1 oz) Adjusted ideal body weight: 78.6 kg (173 lb 3.3 oz)   BMI Readings from Last 4 Encounters:  05/27/18 25.66 kg/m  04/23/18 25.80 kg/m   Wt Readings from Last 4 Encounters:  05/27/18 184 lb (83.5 kg)  04/23/18 185 lb (83.9 kg)  Psych/Mental status: Alert, oriented x 3 (person, place, & time)       Eyes: PERLA Respiratory: No evidence of acute respiratory distress  Cervical Spine Area Exam  Skin & Axial Inspection: No masses, redness, edema, swelling, or associated skin lesions Alignment: Symmetrical Functional ROM: Unrestricted ROM      Stability: No instability  detected Muscle Tone/Strength: Functionally intact. No obvious neuro-muscular anomalies detected. Sensory (Neurological): Unimpaired Palpation: No palpable anomalies              Upper Extremity (UE) Exam    Side: Right upper extremity  Side: Left upper extremity  Skin & Extremity Inspection: Skin color, temperature, and hair growth are WNL. No peripheral edema or cyanosis. No masses, redness, swelling, asymmetry, or associated skin lesions. No contractures.  Skin & Extremity Inspection: Skin color, temperature, and hair growth are WNL. No peripheral edema or cyanosis. No masses, redness, swelling, asymmetry, or associated skin lesions. No contractures.  Functional ROM: Unrestricted ROM          Functional ROM: Unrestricted ROM          Muscle Tone/Strength: Functionally intact. No obvious neuro-muscular anomalies detected.  Muscle Tone/Strength: Functionally intact. No obvious neuro-muscular anomalies detected.  Sensory (Neurological): Unimpaired          Sensory (Neurological): Unimpaired          Palpation: No palpable anomalies              Palpation: No palpable anomalies              Provocative Test(s):  Phalen's test: deferred Tinel's test: deferred Apley's scratch test (touch opposite shoulder):  Action 1 (Across chest): deferred Action 2 (Overhead): deferred Action 3 (LB reach): deferred   Provocative Test(s):  Phalen's test: deferred Tinel's test: deferred Apley's scratch test (touch opposite shoulder):  Action 1 (Across chest): deferred Action 2 (Overhead): deferred Action 3 (LB reach): deferred    Thoracic Spine Area Exam  Skin & Axial Inspection: No masses, redness, or swelling Alignment: Symmetrical Functional ROM: Unrestricted ROM Stability: No instability detected Muscle Tone/Strength: Functionally intact. No obvious neuro-muscular anomalies detected. Sensory (Neurological): Unimpaired Muscle strength & Tone: No palpable anomalies  Lumbar Spine Area Exam  Skin &  Axial Inspection: No masses, redness, or swelling Alignment: Symmetrical Functional ROM: Unrestricted ROM       Stability: No instability detected Muscle Tone/Strength:  Functionally intact. No obvious neuro-muscular anomalies detected. Sensory (Neurological): Unimpaired Palpation: No palpable anomalies       Provocative Tests: Lumbar Hyperextension/rotation test: deferred today       Lumbar quadrant test (Kemp's test): deferred today       Lumbar Lateral bending test: deferred today       Patrick's Maneuver: deferred today                   FABER test: deferred today                   Thigh-thrust test: deferred today       S-I compression test: deferred today       S-I distraction test: deferred today        Gait & Posture Assessment  Ambulation: Unassisted Gait: Relatively normal for age and body habitus Posture: WNL   Lower Extremity Exam    Side: Right lower extremity  Side: Left lower extremity  Stability: No instability observed          Stability: No instability observed          Skin & Extremity Inspection: Skin color, temperature, and hair growth are WNL. No peripheral edema or cyanosis. No masses, redness, swelling, asymmetry, or associated skin lesions. No contractures.  Skin & Extremity Inspection: Skin color, temperature, and hair growth are WNL. No peripheral edema or cyanosis. No masses, redness, swelling, asymmetry, or associated skin lesions. No contractures.  Functional ROM: Unrestricted ROM                  Functional ROM: Unrestricted ROM                  Muscle Tone/Strength: Functionally intact. No obvious neuro-muscular anomalies detected.  Muscle Tone/Strength: Functionally intact. No obvious neuro-muscular anomalies detected.  Sensory (Neurological): Unimpaired  Sensory (Neurological): Unimpaired  Palpation: No palpable anomalies  Palpation: No palpable anomalies   Assessment & Plan  Primary Diagnosis & Pertinent Problem List: The primary encounter  diagnosis was Chronic pain syndrome. Diagnoses of Localized osteoarthrosis of left hip, Lumbar degenerative disc disease, Chronic right shoulder pain, Musculoskeletal pain, Pharmacologic therapy, and Neck pain were also pertinent to this visit.  Visit Diagnosis: 1. Chronic pain syndrome   2. Localized osteoarthrosis of left hip   3. Lumbar degenerative disc disease   4. Chronic right shoulder pain   5. Musculoskeletal pain   6. Pharmacologic therapy   7. Neck pain    General Recommendations: The pain condition that the patient suffers from is best treated with a multidisciplinary approach that involves an increase in physical activity to prevent de-conditioning and worsening of the pain cycle, as well as psychological counseling (formal and/or informal) to address the co-morbid psychological affects of pain. Treatment will often involve judicious use of pain medications and interventional procedures to decrease the pain, allowing the patient to participate in the physical activity that will ultimately produce long-lasting pain reductions. The goal of the multidisciplinary approach is to return the patient to a higher level of overall function and to restore their ability to perform activities of daily living.  67 year old male with a history of right shoulder, right arm, right hand pain, left hip pain, low back pain as well.  Patient has been previously seen by orthopedics as well as physical therapy for his left hip pain.  He has been told that he needs left hip replacement surgery but patient wants to avoid surgery.  He has  tried physical therapy as well as a left ultrasound-guided hip steroid injection which did provide him with pain relief for approximately 6 months. Patient has tried various over-the-counter analgesics including Tylenol, ibuprofen without significant benefit.  Patient denies any lower externally weakness or bowel or bladder dysfunction.    Patient follows up today for his second  patient visit.  Lab results were reviewed which were largely within normal limits.  Patient forgot to obtain his cervical lumbar and SI joint x-rays from his last visit which I reminded the patient to do today.  Patient's UDS appropriate within normal limits.  Note: PMP checked and appropriate.  Will provide patient tramadol prescription as below.  Of note patient did not find any significant benefit with the diclofenac or the tizanidine that was prescribed to him at his first patient visit on 04/23/2018.  He does find tramadol twice daily as needed more helpful than the Voltaren p.o. or Zanaflex p.o.  Pending x-ray results of cervical spine, lumbar  spine, SI joints, will further discuss interventional pain plan with the patient.   Plan of Care  Pharmacotherapy (Medications Ordered): Meds ordered this encounter  Medications  . traMADol (ULTRAM) 50 MG tablet    Sig: Take 1 tablet (50 mg total) by mouth 2 (two) times daily as needed.    Dispense:  60 tablet    Refill:  1   Lab-work, procedure(s), and/or referral(s): No orders of the defined types were placed in this encounter.   Pharmacological management options:  Opioid Analgesics: We'll take over management today. See above orders Membrane stabilizer: We have discussed the possibility of optimizing this mode of therapy, if tolerated Muscle relaxant: We have discussed the possibility of a trial NSAID: We have discussed the possibility of a trial Other analgesic(s): To be determined at a later time   Considering:   To be determined at a later time after x-rays obtained   PRN Procedures:   To be determined at a later time   Provider-requested follow-up: Return in about 6 weeks (around 07/08/2018) for Medication Management. Time Note: Greater than 50% of the 25 minute(s) of face-to-face time spent with Andres Torres, was spent in counseling/coordination of care regarding: Andres Torres primary cause of pain, the treatment plan, treatment  alternatives, medication side effects, going over the informed consent, the opioid analgesic risks and possible complications, the appropriate use of his medications, realistic expectations, the goals of pain management (increased in functionality), the medication agreement and the patient's responsibilities when it comes to controlled substances.  Future Appointments  Date Time Provider [place]  06/05/2018 10:30 AM Bernardo Heater, Ronda Fairly, MD [facility] None  07/03/2018 12:45 PM Gillis Santa, MD [facility] None    Primary Care Physician: Freddy Finner, NP Location: [facility] Facility Note by: Gillis Santa, M.D Date: 05/27/2018; Time: 4:04 PM  Patient Instructions  Please obtain xrays that are already in the computer  Rx for Tramadol

## 2018-05-27 NOTE — Progress Notes (Signed)
Safety precautions to be maintained throughout the outpatient stay will include: orient to surroundings, keep bed in low position, maintain call bell within reach at all times, provide assistance with transfer out of bed and ambulation.  

## 2018-05-27 NOTE — Patient Instructions (Signed)
Please obtain xrays that are already in the computer  Rx for Tramadol

## 2018-06-05 ENCOUNTER — Encounter

## 2018-06-05 ENCOUNTER — Ambulatory Visit: Payer: Medicare Other | Admitting: Urology

## 2018-06-05 ENCOUNTER — Encounter: Payer: Self-pay | Admitting: Urology

## 2018-07-03 ENCOUNTER — Encounter: Payer: Self-pay | Admitting: Student in an Organized Health Care Education/Training Program

## 2018-07-03 ENCOUNTER — Ambulatory Visit
Payer: Medicare Other | Attending: Student in an Organized Health Care Education/Training Program | Admitting: Student in an Organized Health Care Education/Training Program

## 2018-07-03 ENCOUNTER — Other Ambulatory Visit: Payer: Self-pay

## 2018-07-03 VITALS — BP 118/76 | HR 74 | Temp 98.2°F | Resp 16 | Ht 71.0 in

## 2018-07-03 DIAGNOSIS — M25511 Pain in right shoulder: Secondary | ICD-10-CM | POA: Diagnosis not present

## 2018-07-03 DIAGNOSIS — M5136 Other intervertebral disc degeneration, lumbar region: Secondary | ICD-10-CM | POA: Insufficient documentation

## 2018-07-03 DIAGNOSIS — M1612 Unilateral primary osteoarthritis, left hip: Secondary | ICD-10-CM | POA: Diagnosis not present

## 2018-07-03 DIAGNOSIS — Z79899 Other long term (current) drug therapy: Secondary | ICD-10-CM | POA: Diagnosis not present

## 2018-07-03 DIAGNOSIS — M545 Low back pain: Secondary | ICD-10-CM | POA: Diagnosis not present

## 2018-07-03 DIAGNOSIS — M47816 Spondylosis without myelopathy or radiculopathy, lumbar region: Secondary | ICD-10-CM | POA: Diagnosis not present

## 2018-07-03 DIAGNOSIS — G894 Chronic pain syndrome: Secondary | ICD-10-CM | POA: Insufficient documentation

## 2018-07-03 DIAGNOSIS — Z87891 Personal history of nicotine dependence: Secondary | ICD-10-CM | POA: Insufficient documentation

## 2018-07-03 DIAGNOSIS — Z5181 Encounter for therapeutic drug level monitoring: Secondary | ICD-10-CM | POA: Insufficient documentation

## 2018-07-03 DIAGNOSIS — M25552 Pain in left hip: Secondary | ICD-10-CM | POA: Insufficient documentation

## 2018-07-03 DIAGNOSIS — M5459 Other low back pain: Secondary | ICD-10-CM | POA: Insufficient documentation

## 2018-07-03 DIAGNOSIS — Z7982 Long term (current) use of aspirin: Secondary | ICD-10-CM | POA: Diagnosis not present

## 2018-07-03 DIAGNOSIS — M7918 Myalgia, other site: Secondary | ICD-10-CM | POA: Diagnosis not present

## 2018-07-03 DIAGNOSIS — M47812 Spondylosis without myelopathy or radiculopathy, cervical region: Secondary | ICD-10-CM | POA: Insufficient documentation

## 2018-07-03 DIAGNOSIS — M542 Cervicalgia: Secondary | ICD-10-CM | POA: Diagnosis not present

## 2018-07-03 NOTE — Progress Notes (Signed)
Patient's Name: Andres Torres  MRN: [id number]  Referring Provider: Freddy Finner, NP  DOB: 12/14/1950  PCP: Freddy Finner, NP  DOS: 07/03/2018  Note by: Gillis Santa, MD  Service setting: Ambulatory outpatient  Specialty: Interventional Pain Management  Location: [facility] (AMB) Pain Management Facility    Patient type: Established   Primary Reason(s) for Visit: Encounter for prescription drug management. (Level of risk: moderate)  CC: Back Pain (lower); Hip Pain (left); and Neck Pain (right)  HPI  Andres Torres is a 67 y.o. year old, male patient, who comes today for a medication management evaluation. He has Localized osteoarthrosis of left hip; Foot arch pain; Chronic pain syndrome; Lumbar degenerative disc disease; Chronic right shoulder pain; Musculoskeletal pain; Pharmacologic therapy; and Neck pain on their problem list. His primarily concern today is the Back Pain (lower); Hip Pain (left); and Neck Pain (right)  Pain Assessment: Location: Lower Back Radiating: left hip Onset: More than a month ago Duration: Chronic pain Quality: Aching, Constant Severity: 7 /10 (subjective, self-reported pain score)  Note: Reported level is inconsistent with clinical observations. Clinically the patient looks like a 3/10 A 3/10 is viewed as "Moderate" and described as significantly interfering with activities of daily living (ADL). It becomes difficult to feed, bathe, get dressed, get on and off the toilet or to perform personal hygiene functions. Difficult to get in and out of bed or a chair without assistance. Very distracting. With effort, it can be ignored when deeply involved in activities. Information on the proper use of the pain scale provided to the patient today. When using our objective Pain Scale, levels between 6 and 10/10 are said to belong in an emergency room, as it progressively worsens from a 6/10, described as severely limiting, requiring emergency care not usually available at an outpatient  pain management facility. At a 6/10 level, communication becomes difficult and requires great effort. Assistance to reach the emergency department may be required. Facial flushing and profuse sweating along with potentially dangerous increases in heart rate and blood pressure will be evident. Effect on ADL: getting out of bed, bending, twisting, getting up, sleep Timing: Constant Modifying factors:   BP: 118/76  HR: 74  Andres Torres was last scheduled for an appointment on 05/27/2018 for medication management. During today's appointment we reviewed Andres Torres chronic pain status, as well as his outpatient medication regimen.  Patient follows up today to review his x-rays.  No new changes to his medical history.  The patient  reports that he has current or past drug history. His body mass index is 25.66 kg/m.  Further details on both, my assessment(s), as well as the proposed treatment plan, please see below.  Controlled Substance Pharmacotherapy Assessment REMS (Risk Evaluation and Mitigation Strategy)  Analgesic: Tramadol 50 mg twice daily as needed, quantity 60/month MME/day: <10 mg/day.  Ignatius Specking, RN  07/03/2018  1:17 PM  Sign at close encounter Nursing Pain Medication Assessment:  Safety precautions to be maintained throughout the outpatient stay will include: orient to surroundings, keep bed in low position, maintain call bell within reach at all times, provide assistance with transfer out of bed and ambulation.  Medication Inspection Compliance: Pill count conducted under aseptic conditions, in front of the patient. Neither the pills nor the bottle was removed from the patient's sight at any time. Once count was completed pills were immediately returned to the patient in their original bottle.  Medication: Tramadol (Ultram) Pill/Patch Count: 5 of 60 pills remain Pill/Patch Appearance: Markings  consistent with prescribed medication Bottle Appearance: Standard pharmacy  container. Clearly labeled. Filled Date: 7 / 96 / 2019 Last Medication intake:  Day before yesterday   Pharmacokinetics: Liberation and absorption (onset of action): WNL Distribution (time to peak effect): WNL Metabolism and excretion (duration of action): WNL         Pharmacodynamics: Desired effects: Analgesia: Andres Torres reports >50% benefit. Functional ability: Patient reports that medication allows him to accomplish basic ADLs Clinically meaningful improvement in function (CMIF): Sustained CMIF goals met Perceived effectiveness: Described as relatively effective, allowing for increase in activities of daily living (ADL) Undesirable effects: Side-effects or Adverse reactions: None reported Monitoring: [place] PMP: Online review of the past 19-monthperiod conducted. Compliant with practice rules and regulations Last UDS on record: Summary  Date Value Ref Range Status  04/23/2018 FINAL  Final    Comment:    ==================================================================== TOXASSURE COMP DRUG ANALYSIS,UR ==================================================================== Test                             Result       Flag       Units Drug Present and Declared for Prescription Verification   Tramadol                       >2841        EXPECTED   ng/mg creat   O-Desmethyltramadol            >2841        EXPECTED   ng/mg creat   N-Desmethyltramadol            1177         EXPECTED   ng/mg creat    Source of tramadol is a prescription medication.    O-desmethyltramadol and N-desmethyltramadol are expected    metabolites of tramadol. Drug Present not Declared for Prescription Verification   Carboxy-THC                    18           UNEXPECTED ng/mg creat    Carboxy-THC is a metabolite of tetrahydrocannabinol  (THC).    [facility] of [facility] most commonly illicit, but THC is also present    in a scheduled prescription medication.   Acetaminophen                  PRESENT       UNEXPECTED Drug Absent but Declared for Prescription Verification   Tizanidine                     Not Detected UNEXPECTED    Tizanidine, as indicated in the declared medication list, is not    always detected even when used as directed.   Diclofenac                     Not Detected UNEXPECTED    Diclofenac, as indicated in the declared medication list, is not    always detected even when used as directed.   Salicylate                     Not Detected UNEXPECTED    Aspirin, as indicated in the declared medication list, is not    always detected even when used as directed. ==================================================================== Test  Result    Flag   Units      Ref Range   Creatinine              176              mg/dL      >=20 ==================================================================== Declared Medications:  The flagging and interpretation on this report are based on the  following declared medications.  Unexpected results may arise from  inaccuracies in the declared medications.  **Note: The testing scope of this panel includes these medications:  Tramadol (Ultram)  **Note: The testing scope of this panel does not include small to  moderate amounts of these reported medications:  Aspirin (Aspirin 81)  Diclofenac (Voltaren)  Tizanidine (Zanaflex)  **Note: The testing scope of this panel does not include following  reported medications:  Albuterol (Ventolin HFA) ==================================================================== For clinical consultation, please call [phone]. ====================================================================    UDS interpretation: Compliant          Medication Assessment Form: Reviewed. Patient indicates being compliant with therapy Treatment compliance: Compliant Risk Assessment Profile: Aberrant behavior: See prior evaluations. None observed or detected today Comorbid factors increasing risk  of overdose: See prior notes. No additional risks detected today Opioid risk tool (ORT) (Total Score): 4 Personal History of Substance Abuse (SUD-Substance use disorder):  Alcohol: Negative  Illegal Drugs: Positive Male or Male  Rx Drugs: Negative  ORT Risk Level calculation: Moderate Risk Risk of substance use disorder (SUD): Low Opioid Risk Tool - 07/03/18 1315      Personal History of Substance Abuse   Alcohol  Negative    Illegal Drugs  Positive Male or Male    Rx Drugs  Negative      Psychological Disease   Psychological Disease  Negative    Depression  Negative      Total Score   Opioid Risk Tool Scoring  4    Opioid Risk Interpretation  Moderate Risk      ORT Scoring interpretation table:  Score <3 = Low Risk for SUD  Score between 4-7 = Moderate Risk for SUD  Score >8 = High Risk for Opioid Abuse   Risk Mitigation Strategies:  Patient Counseling: Covered Patient-Prescriber Agreement (PPA): Present and active  Notification to other healthcare providers: Done  Pharmacologic Plan: No change in therapy, at this time.             Laboratory Chemistry  Inflammation Markers (CRP: Acute Phase) (ESR: Chronic Phase) No results found for: CRP, ESRSEDRATE, LATICACIDVEN                       Rheumatology Markers No results found for: RF, ANA, LABURIC, URICUR, LYMEIGGIGMAB, LYMEABIGMQN, HLAB27                      Renal Function Markers Lab Results  Component Value Date   BUN 12 04/23/2018   CREATININE 1.18 04/23/2018   BCR 10 04/23/2018   GFRAA 73 04/23/2018   GFRNONAA 63 04/23/2018                             Hepatic Function Markers Lab Results  Component Value Date   AST 18 04/23/2018   ALT 18 04/23/2018   ALBUMIN 4.5 04/23/2018   ALKPHOS 69 04/23/2018  Electrolytes Lab Results  Component Value Date   NA 141 04/23/2018   K 4.5 04/23/2018   CL 102 04/23/2018   CALCIUM 9.8 04/23/2018   MG 2.2 04/23/2018                         Neuropathy Markers Lab Results  Component Value Date   VITAMINB12 1,151 04/23/2018                        Bone Pathology Markers No results found for: [place], [id number], [id number], BE0100FH2, 25OHVITD1, 25OHVITD2, 25OHVITD3, TESTOFREE, TESTOSTERONE                       Coagulation Parameters No results found for: INR, LABPROT, APTT, PLT, DDIMER                      Cardiovascular Markers No results found for: BNP, CKTOTAL, CKMB, TROPONINI, HGB, HCT                       CA Markers No results found for: CEA, CA125, LABCA2                      Note: Lab results reviewed.  Recent Diagnostic Imaging Results  DG Cervical Spine With Flex & Extend CLINICAL DATA:  67 year old male with chronic low back, left hip and neck pain. No reported trauma. Initial encounter.  EXAM: CERVICAL SPINE COMPLETE WITH FLEXION AND EXTENSION VIEWS  COMPARISON:  None.  FINDINGS: Straightening of the cervical spine.  Mild C5-6 and C6-7 disc space narrowing.  Facet degenerative changes and/or uncinate hypertrophy contribute to right-sided C3-4 and C6-7 foraminal narrowing and left-sided C5-6 and C6-7 foraminal narrowing.  No abnormal motion between flexion and extension.  No lung apical lesion noted.  No fracture detected.  IMPRESSION: Straightening of the cervical spine.  Mild C5-6 and C6-7 disc space narrowing.  Facet degenerative changes and/or uncinate hypertrophy contribute to right-sided C3-4 and C6-7 foraminal narrowing and left-sided C5-6 and C6-7 foraminal narrowing.  Electronically Signed   By: Genia Del M.D.   On: 05/27/2018 16:28 DG Si Joints CLINICAL DATA:  67 year old male with chronic low back, left hip and neck pain. No reported trauma. Initial encounter.  EXAM: BILATERAL SACROILIAC JOINTS - 3+ VIEW  COMPARISON:  None.  FINDINGS: Marked left hip joint degenerative changes with joint space narrowing, significant subchondral cysts and surrounding  sclerosis. Mild flattening left femoral head without collapse.  Mild right-sided hip joint degenerative changes.  Partial fusion sacroiliac joints bilaterally.  IMPRESSION: Marked left sided and mild right-sided hip joint degenerative changes.  Partial fusion sacroiliac joints bilaterally.  Electronically Signed   By: Genia Del M.D.   On: 05/27/2018 16:24 DG Lumbar Spine Complete W/Bend CLINICAL DATA:  67 year old male with chronic low back, left hip and neck pain. No reported trauma. Initial encounter.  EXAM: LUMBAR SPINE - COMPLETE WITH BENDING VIEWS  COMPARISON:  None.  FINDINGS: 3 mm anterior slip L4 secondary to facet degenerative changes. Pars defect not visualized. Very mild L4-5 disc space narrowing.  L3-4 and L5-S1 mild facet degenerative changes and mild disc space narrowing.  No compression fracture.  No abnormal motion between flexion and extension.  Partial fusion sacroiliac joints bilaterally.  Marked left-sided and mild right-sided hip joint degenerative changes.  Vascular calcifications.  IMPRESSION: 3 mm anterior slip L4 secondary  to facet degenerative changes. Very mild L4-5 disc space narrowing.  L3-4 and L5-S1 mild facet degenerative changes and mild disc space narrowing.  No abnormal motion between flexion and extension.  Partial fusion sacroiliac joints bilaterally.  Marked left-sided and mild right-sided hip joint degenerative changes.  Electronically Signed   By: Genia Del M.D.   On: 05/27/2018 16:22  Complexity Note: Imaging results reviewed. Results shared with Andres Torres, using Layman's terms. Today I personally and independently reviewed the study images pertinent to Andres Torres problem.            I have personally examined the images and I agree with the reported  findings. I find no additional pain-related pathology to add to the report.  Meds   Current Outpatient Medications:  .  aspirin EC 81 MG tablet, Take  by mouth., Disp: , Rfl:  .  CANNABIDIOL PO, Take by mouth., Disp: , Rfl:  .  diclofenac (VOLTAREN) 75 MG EC tablet, Take 1 tablet (75 mg total) by mouth 2 (two) times daily., Disp: 60 tablet, Rfl: 1 .  tiZANidine (ZANAFLEX) 4 MG capsule, Take 4 mg by mouth every evening., Disp: , Rfl:  .  traMADol (ULTRAM) 50 MG tablet, Take 1 tablet (50 mg total) by mouth 2 (two) times daily as needed., Disp: 60 tablet, Rfl: 1 .  VENTOLIN HFA 108 (90 Base) MCG/ACT inhaler, , Disp: , Rfl:   ROS  Constitutional: Denies any fever or chills Gastrointestinal: No reported hemesis, hematochezia, vomiting, or acute GI distress Musculoskeletal: Denies any acute onset joint swelling, redness, loss of ROM, or weakness Neurological: No reported episodes of acute onset apraxia, aphasia, dysarthria, agnosia, amnesia, paralysis, loss of coordination, or loss of consciousness  Allergies  Andres Torres has No Known Allergies.  [place]  Drug: Andres Torres  reports that he has current or past drug history. Alcohol:  reports that he drank alcohol. Tobacco:  reports that he quit smoking about 3 months ago. He has never used smokeless tobacco. Medical:  has no past medical history on file. Surgical: Andres Torres  has no past surgical history on file. Family: family history is not on file.  Constitutional Exam  General appearance: Well nourished, well developed, and well hydrated. In no apparent acute distress Vitals:   07/03/18 1305  BP: 118/76  Pulse: 74  Resp: 16  Temp: 98.2 F (36.8 C)  SpO2: 100%  Height: '5\' 11"'  (1.803 m)   BMI Assessment: Estimated body mass index is 25.66 kg/m as calculated from the following:   Height as of this encounter: '5\' 11"'  (1.803 m).   Weight as of 05/27/18: 184 lb (83.5 kg).  BMI interpretation table: BMI level Category Range association with higher incidence of chronic pain  <18 kg/m2 Underweight   18.5-24.9 kg/m2 Ideal body weight   25-29.9 kg/m2 Overweight Increased incidence by 20%   30-34.9 kg/m2 Obese (Class I) Increased incidence by 68%  35-39.9 kg/m2 Severe obesity (Class II) Increased incidence by 136%  >40 kg/m2 Extreme obesity (Class III) Increased incidence by 254%   Patient's current BMI Ideal Body weight  Body mass index is 25.66 kg/m. Patient weight not recorded   BMI Readings from Last 4 Encounters:  07/03/18 25.66 kg/m  05/27/18 25.66 kg/m  04/23/18 25.80 kg/m   Wt Readings from Last 4 Encounters:  05/27/18 184 lb (83.5 kg)  04/23/18 185 lb (83.9 kg)  Psych/Mental status: Alert, oriented x 3 (person, place, & time)       Eyes:  PERLA Respiratory: No evidence of acute respiratory distress  Cervical Spine Area Exam  Skin & Axial Inspection: No masses, redness, edema, swelling, or associated skin lesions Alignment: Symmetrical Functional ROM: Decreased ROM, bilaterally Stability: No instability detected Muscle Tone/Strength: Functionally intact. No obvious neuro-muscular anomalies detected. Sensory (Neurological): Musculoskeletal pain pattern Palpation: No palpable anomalies              Upper Extremity (UE) Exam    Side: Right upper extremity  Side: Left upper extremity  Skin & Extremity Inspection: Skin color, temperature, and hair growth are WNL. No peripheral edema or cyanosis. No masses, redness, swelling, asymmetry, or associated skin lesions. No contractures.  Skin & Extremity Inspection: Skin color, temperature, and hair growth are WNL. No peripheral edema or cyanosis. No masses, redness, swelling, asymmetry, or associated skin lesions. No contractures.  Functional ROM: Unrestricted ROM          Functional ROM: Unrestricted ROM          Muscle Tone/Strength: Functionally intact. No obvious neuro-muscular anomalies detected.  Muscle Tone/Strength: Functionally intact. No obvious neuro-muscular anomalies detected.  Sensory (Neurological): Dermatomal pain pattern          Sensory (Neurological): Unimpaired          Palpation: No palpable  anomalies              Palpation: No palpable anomalies              Provocative Test(s):  Phalen's test: deferred Tinel's test: deferred Apley's scratch test (touch opposite shoulder):  Action 1 (Across chest): deferred Action 2 (Overhead): deferred Action 3 (LB reach): deferred   Provocative Test(s):  Phalen's test: deferred Tinel's test: deferred Apley's scratch test (touch opposite shoulder):  Action 1 (Across chest): deferred Action 2 (Overhead): deferred Action 3 (LB reach): deferred    Thoracic Spine Area Exam  Skin & Axial Inspection: No masses, redness, or swelling Alignment: Symmetrical Functional ROM: Unrestricted ROM Stability: No instability detected Muscle Tone/Strength: Functionally intact. No obvious neuro-muscular anomalies detected. Sensory (Neurological): Musculoskeletal pain pattern Muscle strength & Tone: No palpable anomalies  Lumbar Spine Area Exam  Skin & Axial Inspection: No masses, redness, or swelling Alignment: Symmetrical Functional ROM: Unrestricted ROM       Stability: No instability detected Muscle Tone/Strength: Functionally intact. No obvious neuro-muscular anomalies detected. Sensory (Neurological): Musculoskeletal pain pattern Palpation: No palpable anomalies       Provocative Tests: Hyperextension/rotation test: (+) bilaterally for facet joint pain. Lumbar quadrant test (Kemp's test): deferred today       Lateral bending test: deferred today       Patrick's Maneuver: deferred today                   FABER test: deferred today                   S-I anterior distraction/compression test: deferred today         S-I lateral compression test: deferred today         S-I Thigh-thrust test: deferred today         S-I Gaenslen's test: deferred today          Gait & Posture Assessment  Ambulation: Unassisted Gait: Relatively normal for age and body habitus Posture: WNL   Lower Extremity Exam    Side: Right lower extremity  Side: Left lower  extremity  Stability: No instability observed          Stability: No  instability observed          Skin & Extremity Inspection: Skin color, temperature, and hair growth are WNL. No peripheral edema or cyanosis. No masses, redness, swelling, asymmetry, or associated skin lesions. No contractures.  Skin & Extremity Inspection: Skin color, temperature, and hair growth are WNL. No peripheral edema or cyanosis. No masses, redness, swelling, asymmetry, or associated skin lesions. No contractures.  Functional ROM: Unrestricted ROM                  Functional ROM: Unrestricted ROM                  Muscle Tone/Strength: Functionally intact. No obvious neuro-muscular anomalies detected.  Muscle Tone/Strength: Functionally intact. No obvious neuro-muscular anomalies detected.  Sensory (Neurological): Unimpaired  Sensory (Neurological): Unimpaired  Palpation: No palpable anomalies  Palpation: No palpable anomalies   Assessment  Primary Diagnosis & Pertinent Problem List: The primary encounter diagnosis was Lumbar spondylosis. Diagnoses of Lumbar facet joint pain, Lumbar degenerative disc disease, Cervical facet joint syndrome, and Localized osteoarthrosis of left hip were also pertinent to this visit.  Status Diagnosis  Persistent Persistent Persistent 1. Lumbar spondylosis   2. Lumbar facet joint pain   3. Lumbar degenerative disc disease   4. Cervical facet joint syndrome   5. Localized osteoarthrosis of left hip     General Recommendations: The pain condition that the patient suffers from is best treated with a multidisciplinary approach that involves an increase in physical activity to prevent de-conditioning and worsening of the pain cycle, as well as psychological counseling (formal and/or informal) to address the co-morbid psychological affects of pain. Treatment will often involve judicious use of pain medications and interventional procedures to decrease the pain, allowing the patient to  participate in the physical activity that will ultimately produce long-lasting pain reductions. The goal of the multidisciplinary approach is to return the patient to a higher level of overall function and to restore their ability to perform activities of daily living.   67 year old male with a history of chronic pain is localized to his cervical region radiates down his right arm occasionally along with axial low back pain and severe left hip pain.  Patient cervical spine x-rays reveal cervical facet disease as well as cervical degenerative disease at C4-C5, C5-C6, C6, C7.  Patient also has mild to moderate lumbar facet pathology most pronounced at L4-L5 and L5-S1.  Patient has severe osteoarthritis and degenerative changes of his left hip.  Patient wants to avoid a left hip replacement surgery although he has been told that he needs one.  Looking at the patient's x-rays, we discussed potential interventional options.  For his cervical facet pathology, we discussed cervical facet medial branch nerve blocks.  For his left hip osteoarthritis, it will be difficult to perform a intra-articular hip injection given the level of degeneration and osteoarthritis.  Patient wants to hold off on any interventional therapies at this time.  I recommended the patient take tramadol 100 mg twice daily as needed for severe pain.  Patient seldomly uses his tramadol but when he does use that he does find it beneficial.  I recommended that he increase his dose and reinforce that he only take it when it is needed.  Patient will follow-up in 6 weeks.  Time Note: Greater than 50% of the 15 minute(s) of face-to-face time spent with Andres Torres, was spent in counseling/coordination of care regarding: the appropriate use of the pain scale, Andres Torres primary cause  of pain, the results of his recent test(s), the significance of each one oth the test(s) anomalies and it's corresponding characteristic pain pattern(s), the treatment plan,  treatment alternatives, the risks and possible complications of proposed treatment, medication side effects, realistic expectations and the goals of pain management (increased in functionality).  Provider-requested follow-up: Return in about 5 weeks (around 08/07/2018) for Medication Management.  Future Appointments  Date Time Provider [place]  08/06/2018  9:30 AM Gillis Santa, MD [facility] None    Primary Care Physician: Freddy Finner, NP Location: [facility] Outpatient Pain Management Facility Note by: Gillis Santa, M.D Date: 07/03/2018; Time: 2:49 PM  There are no Patient Instructions on file for this visit.

## 2018-07-03 NOTE — Progress Notes (Signed)
Nursing Pain Medication Assessment:  Safety precautions to be maintained throughout the outpatient stay will include: orient to surroundings, keep bed in low position, maintain call bell within reach at all times, provide assistance with transfer out of bed and ambulation.  Medication Inspection Compliance: Pill count conducted under aseptic conditions, in front of the patient. Neither the pills nor the bottle was removed from the patient's sight at any time. Once count was completed pills were immediately returned to the patient in their original bottle.  Medication: Tramadol (Ultram) Pill/Patch Count: 5 of 60 pills remain Pill/Patch Appearance: Markings consistent with prescribed medication Bottle Appearance: Standard pharmacy container. Clearly labeled. Filled Date: 7 / 2816 / 2019 Last Medication intake:  Day before yesterday

## 2018-08-06 ENCOUNTER — Ambulatory Visit
Payer: Medicare Other | Attending: Student in an Organized Health Care Education/Training Program | Admitting: Student in an Organized Health Care Education/Training Program

## 2018-08-06 ENCOUNTER — Other Ambulatory Visit: Payer: Self-pay

## 2018-08-06 ENCOUNTER — Encounter: Payer: Self-pay | Admitting: Student in an Organized Health Care Education/Training Program

## 2018-08-06 VITALS — BP 117/76 | HR 71 | Temp 97.8°F | Resp 16 | Ht 71.0 in | Wt 183.0 lb

## 2018-08-06 DIAGNOSIS — M5412 Radiculopathy, cervical region: Secondary | ICD-10-CM | POA: Diagnosis not present

## 2018-08-06 DIAGNOSIS — Z79899 Other long term (current) drug therapy: Secondary | ICD-10-CM | POA: Insufficient documentation

## 2018-08-06 DIAGNOSIS — Z833 Family history of diabetes mellitus: Secondary | ICD-10-CM | POA: Diagnosis not present

## 2018-08-06 DIAGNOSIS — M47812 Spondylosis without myelopathy or radiculopathy, cervical region: Secondary | ICD-10-CM

## 2018-08-06 DIAGNOSIS — M25511 Pain in right shoulder: Secondary | ICD-10-CM | POA: Diagnosis not present

## 2018-08-06 DIAGNOSIS — M47816 Spondylosis without myelopathy or radiculopathy, lumbar region: Secondary | ICD-10-CM | POA: Diagnosis not present

## 2018-08-06 DIAGNOSIS — M7918 Myalgia, other site: Secondary | ICD-10-CM | POA: Diagnosis not present

## 2018-08-06 DIAGNOSIS — Z8261 Family history of arthritis: Secondary | ICD-10-CM | POA: Insufficient documentation

## 2018-08-06 DIAGNOSIS — Z841 Family history of disorders of kidney and ureter: Secondary | ICD-10-CM | POA: Diagnosis not present

## 2018-08-06 DIAGNOSIS — G894 Chronic pain syndrome: Secondary | ICD-10-CM | POA: Insufficient documentation

## 2018-08-06 DIAGNOSIS — Z79891 Long term (current) use of opiate analgesic: Secondary | ICD-10-CM | POA: Insufficient documentation

## 2018-08-06 DIAGNOSIS — Z7982 Long term (current) use of aspirin: Secondary | ICD-10-CM | POA: Insufficient documentation

## 2018-08-06 DIAGNOSIS — Z87891 Personal history of nicotine dependence: Secondary | ICD-10-CM | POA: Insufficient documentation

## 2018-08-06 DIAGNOSIS — E119 Type 2 diabetes mellitus without complications: Secondary | ICD-10-CM | POA: Diagnosis not present

## 2018-08-06 MED ORDER — TRAMADOL HCL 50 MG PO TABS: 100.0000 mg | ORAL_TABLET | Freq: Two times a day (BID) | ORAL | 2 refills | Status: AC | PRN

## 2018-08-06 NOTE — Progress Notes (Signed)
Nursing Pain Medication Assessment:  Safety precautions to be maintained throughout the outpatient stay will include: orient to surroundings, keep bed in low position, maintain call bell within reach at all times, provide assistance with transfer out of bed and ambulation.  Medication Inspection Compliance: Pill count conducted under aseptic conditions, in front of the patient. Neither the pills nor the bottle was removed from the patient's sight at any time. Once count was completed pills were immediately returned to the patient in their original bottle.  Medication: Tramadol (Ultram) Pill/Patch Count: 3 of 60 pills remain Pill/Patch Appearance: Markings consistent with prescribed medication Bottle Appearance: Standard pharmacy container. Clearly labeled. Filled Date: 08/22 / 2019 Last Medication intake:  Yesterday

## 2018-08-06 NOTE — Patient Instructions (Signed)
    You have been given a script for Tramadol today    Preparing for Procedure with Sedation Instructions: . Oral Intake: Do not eat or drink anything for at least 8 hours prior to your procedure. . Transportation: Public transportation is not allowed. Bring an adult driver. The driver must be physically present in our waiting room before any procedure can be started. Marland Kitchen. Physical Assistance: Bring an adult capable of physically assisting you, in the event you need help. . Blood Pressure Medicine: Take your blood pressure medicine with a sip of water the morning of the procedure. . Insulin: Take only  of your normal insulin dose. . Preventing infections: Shower with an antibacterial soap the morning of your procedure. . Build-up your immune system: Take 1000 mg of Vitamin C with every meal (3 times a day) the day prior to your procedure. . Pregnancy: If you are pregnant, call and cancel the procedure. . Sickness: If you have a cold, fever, or any active infections, call and cancel the procedure. . Arrival: You must be in the facility at least 30 minutes prior to your scheduled procedure. . Children: Do not bring children with you. . Dress appropriately: Bring dark clothing that you would not mind if they get stained. . Valuables: Do not bring any jewelry or valuables. Procedure appointments are reserved for interventional treatments only. Marland Kitchen. No Prescription Refills. . No medication changes will be discussed during procedure appointments. . No disability issues will be discussed.

## 2018-08-06 NOTE — Progress Notes (Signed)
Patient's Name: Mizraim Harmening  MRN: [id number]  Referring Provider: Freddy Finner, NP  DOB: 12-15-1950  PCP: Freddy Finner, NP  DOS: 08/06/2018  Note by: Gillis Santa, MD  Service setting: Ambulatory outpatient  Specialty: Interventional Pain Management  Location: [facility] (AMB) Pain Management Facility    Patient type: Established   Primary Reason(s) for Visit: Encounter for prescription drug management. (Level of risk: moderate)  CC: Shoulder Pain (right); Arm Pain; and Hip Pain (left)  HPI  Mr. Gaza is a 67 y.o. year old, male patient, who comes today for a medication management evaluation. He has Localized osteoarthrosis of left hip; Foot arch pain; Chronic pain syndrome; Lumbar degenerative disc disease; Chronic right shoulder pain; Musculoskeletal pain; Pharmacologic therapy; Neck pain; Lumbar spondylosis; Lumbar facet joint pain; and Cervical facet joint syndrome on their problem list. His primarily concern today is the Shoulder Pain (right); Arm Pain; and Hip Pain (left)  Pain Assessment: Location: Right Shoulder(left hip) Radiating: right shoulder pain radiates down right arm to forearm, left hip radiates everywhere Onset: More than a month ago Duration: Chronic pain Quality: Aching, Constant, Sharp Severity: 7 /10 (subjective, self-reported pain score)  Note: Reported level is compatible with observation.                         When using our objective Pain Scale, levels between 6 and 10/10 are said to belong in an emergency room, as it progressively worsens from a 6/10, described as severely limiting, requiring emergency care not usually available at an outpatient pain management facility. At a 6/10 level, communication becomes difficult and requires great effort. Assistance to reach the emergency department may be required. Facial flushing and profuse sweating along with potentially dangerous increases in heart rate and blood pressure will be evident. Effect on ADL: limits  activities Timing: Constant Modifying factors: denies BP: 117/76  HR: 71  Mr. Monteforte was last scheduled for an appointment on 07/03/2018 for medication management. During today's appointment we reviewed Mr. Skillin chronic pain status, as well as his outpatient medication regimen.  The patient  reports that he has current or past drug history. His body mass index is 25.52 kg/m.  Further details on both, my assessment(s), as well as the proposed treatment plan, please see below.  Patient presents for follow-up.  Endorsing worsening right-sided neck and shoulder pain that radiates down to the patient's forearm and right hand.  Is having difficulty with fine motor control and picking up objects.  Is interested in discussing interventional options.  Controlled Substance Pharmacotherapy Assessment REMS (Risk Evaluation and Mitigation Strategy)  Analgesic: Tramadol 100 mg twice daily MME/day: Less than 20 mg/day.  Dewayne Shorter, RN  08/06/2018  9:50 AM  Signed Nursing Pain Medication Assessment:  Safety precautions to be maintained throughout the outpatient stay will include: orient to surroundings, keep bed in low position, maintain call bell within reach at all times, provide assistance with transfer out of bed and ambulation.  Medication Inspection Compliance: Pill count conducted under aseptic conditions, in front of the patient. Neither the pills nor the bottle was removed from the patient's sight at any time. Once count was completed pills were immediately returned to the patient in their original bottle.  Medication: Tramadol (Ultram) Pill/Patch Count: 3 of 60 pills remain Pill/Patch Appearance: Markings consistent with prescribed medication Bottle Appearance: Standard pharmacy container. Clearly labeled. Filled Date: 08/22 / 2019 Last Medication intake:  Yesterday   Pharmacokinetics: Liberation and absorption (onset  of action): WNL Distribution (time to peak effect): WNL Metabolism  and excretion (duration of action): WNL         Pharmacodynamics: Desired effects: Analgesia: Mr. Zeman reports >50% benefit. Functional ability: Patient reports that medication allows him to accomplish basic ADLs Clinically meaningful improvement in function (CMIF): Sustained CMIF goals met Perceived effectiveness: Described as relatively effective, allowing for increase in activities of daily living (ADL) Undesirable effects: Side-effects or Adverse reactions: None reported Monitoring: [place] PMP: Online review of the past 68-monthperiod conducted. Compliant with practice rules and regulations Last UDS on record: Summary  Date Value Ref Range Status  04/23/2018 FINAL  Final    Comment:    ==================================================================== TOXASSURE COMP DRUG ANALYSIS,UR ==================================================================== Test                             Result       Flag       Units Drug Present and Declared for Prescription Verification   Tramadol                       >2841        EXPECTED   ng/mg creat   O-Desmethyltramadol            >2841        EXPECTED   ng/mg creat   N-Desmethyltramadol            1177         EXPECTED   ng/mg creat    Source of tramadol is a prescription medication.    O-desmethyltramadol and N-desmethyltramadol are expected    metabolites of tramadol. Drug Present not Declared for Prescription Verification   Carboxy-THC                    18           UNEXPECTED ng/mg creat    Carboxy-THC is a metabolite of tetrahydrocannabinol  (THC).    Source of [facility]is most commonly illicit, but THC is also present    in a scheduled prescription medication.   Acetaminophen                  PRESENT      UNEXPECTED Drug Absent but Declared for Prescription Verification   Tizanidine                     Not Detected UNEXPECTED    Tizanidine, as indicated in the declared medication list, is not    always detected even when used as  directed.   Diclofenac                     Not Detected UNEXPECTED    Diclofenac, as indicated in the declared medication list, is not    always detected even when used as directed.   Salicylate                     Not Detected UNEXPECTED    Aspirin, as indicated in the declared medication list, is not    always detected even when used as directed. ==================================================================== Test                      Result    Flag   Units      Ref Range   Creatinine  176              mg/dL      >=20 ==================================================================== Declared Medications:  The flagging and interpretation on this report are based on the  following declared medications.  Unexpected results may arise from  inaccuracies in the declared medications.  **Note: The testing scope of this panel includes these medications:  Tramadol (Ultram)  **Note: The testing scope of this panel does not include small to  moderate amounts of these reported medications:  Aspirin (Aspirin 81)  Diclofenac (Voltaren)  Tizanidine (Zanaflex)  **Note: The testing scope of this panel does not include following  reported medications:  Albuterol (Ventolin HFA) ==================================================================== For clinical consultation, please call [phone]. ====================================================================    UDS interpretation: Compliant          Medication Assessment Form: Reviewed. Patient indicates being compliant with therapy Treatment compliance: Compliant Risk Assessment Profile: Aberrant behavior: See prior evaluations. None observed or detected today Comorbid factors increasing risk of overdose: See prior notes. No additional risks detected today Opioid risk tool (ORT) (Total Score): 3 Personal History of Substance Abuse (SUD-Substance use disorder):  Alcohol:    Illegal Drugs:    Rx Drugs:    ORT Risk  Level calculation: Low Risk Risk of substance use disorder (SUD): Low Opioid Risk Tool - 08/06/18 0949      Family History of Substance Abuse   Alcohol  Positive Male    Illegal Drugs  Negative    Rx Drugs  Negative      Age   Age between 49-45 years   No      History of Preadolescent Sexual Abuse   History of Preadolescent Sexual Abuse  Negative or Male      Total Score   Opioid Risk Tool Scoring  3    Opioid Risk Interpretation  Low Risk      ORT Scoring interpretation table:  Score <3 = Low Risk for SUD  Score between 4-7 = Moderate Risk for SUD  Score >8 = High Risk for Opioid Abuse   Risk Mitigation Strategies:  Patient Counseling: Covered Patient-Prescriber Agreement (PPA): Present and active  Notification to other healthcare providers: Done  Pharmacologic Plan: No change in therapy, at this time.             Laboratory Chemistry  Inflammation Markers (CRP: Acute Phase) (ESR: Chronic Phase) No results found for: CRP, ESRSEDRATE, LATICACIDVEN                       Rheumatology Markers No results found for: RF, ANA, LABURIC, URICUR, LYMEIGGIGMAB, LYMEABIGMQN, HLAB27                      Renal Function Markers Lab Results  Component Value Date   BUN 12 04/23/2018   CREATININE 1.18 04/23/2018   BCR 10 04/23/2018   GFRAA 73 04/23/2018   GFRNONAA 63 04/23/2018                             Hepatic Function Markers Lab Results  Component Value Date   AST 18 04/23/2018   ALT 18 04/23/2018   ALBUMIN 4.5 04/23/2018   ALKPHOS 69 04/23/2018                        Electrolytes Lab Results  Component Value Date   NA 141 04/23/2018  K 4.5 04/23/2018   CL 102 04/23/2018   CALCIUM 9.8 04/23/2018   MG 2.2 04/23/2018                        Neuropathy Markers Lab Results  Component Value Date   YIAXKPVV74 8,270 04/23/2018                        CNS Tests No results found for: COLORCSF, APPEARCSF, RBCCOUNTCSF, WBCCSF, POLYSCSF, LYMPHSCSF, EOSCSF,  PROTEINCSF, GLUCCSF, JCVIRUS, CSFOLI, IGGCSF                      Bone Pathology Markers No results found for: VD25OH, BE675QG9EEF, EO7121FX5, OI3254DI2, 25OHVITD1, 25OHVITD2, 25OHVITD3, TESTOFREE, TESTOSTERONE                       Coagulation Parameters No results found for: INR, LABPROT, APTT, PLT, DDIMER                      Cardiovascular Markers No results found for: BNP, CKTOTAL, CKMB, TROPONINI, HGB, HCT                       CA Markers No results found for: CEA, CA125, LABCA2                      Note: Lab results reviewed.  Recent Diagnostic Imaging Results  DG Cervical Spine With Flex & Extend CLINICAL DATA:  67 year old male with chronic low back, left hip and neck pain. No reported trauma. Initial encounter.  EXAM: CERVICAL SPINE COMPLETE WITH FLEXION AND EXTENSION VIEWS  COMPARISON:  None.  FINDINGS: Straightening of the cervical spine.  Mild C5-6 and C6-7 disc space narrowing.  Facet degenerative changes and/or uncinate hypertrophy contribute to right-sided C3-4 and C6-7 foraminal narrowing and left-sided C5-6 and C6-7 foraminal narrowing.  No abnormal motion between flexion and extension.  No lung apical lesion noted.  No fracture detected.  IMPRESSION: Straightening of the cervical spine.  Mild C5-6 and C6-7 disc space narrowing.  Facet degenerative changes and/or uncinate hypertrophy contribute to right-sided C3-4 and C6-7 foraminal narrowing and left-sided C5-6 and C6-7 foraminal narrowing.  Electronically Signed   By: Genia Del M.D.   On: 05/27/2018 16:28 DG Si Joints CLINICAL DATA:  67 year old male with chronic low back, left hip and neck pain. No reported trauma. Initial encounter.  EXAM: BILATERAL SACROILIAC JOINTS - 3+ VIEW  COMPARISON:  None.  FINDINGS: Marked left hip joint degenerative changes with joint space narrowing, significant subchondral cysts and surrounding sclerosis. Mild flattening left femoral head without  collapse.  Mild right-sided hip joint degenerative changes.  Partial fusion sacroiliac joints bilaterally.  IMPRESSION: Marked left sided and mild right-sided hip joint degenerative changes.  Partial fusion sacroiliac joints bilaterally.  Electronically Signed   By: Genia Del M.D.   On: 05/27/2018 16:24 DG Lumbar Spine Complete W/Bend CLINICAL DATA:  67 year old male with chronic low back, left hip and neck pain. No reported trauma. Initial encounter.  EXAM: LUMBAR SPINE - COMPLETE WITH BENDING VIEWS  COMPARISON:  None.  FINDINGS: 3 mm anterior slip L4 secondary to facet degenerative changes. Pars defect not visualized. Very mild L4-5 disc space narrowing.  L3-4 and L5-S1 mild facet degenerative changes and mild disc space narrowing.  No compression fracture.  No abnormal motion between flexion and extension.  Partial  fusion sacroiliac joints bilaterally.  Marked left-sided and mild right-sided hip joint degenerative changes.  Vascular calcifications.  IMPRESSION: 3 mm anterior slip L4 secondary to facet degenerative changes. Very mild L4-5 disc space narrowing.  L3-4 and L5-S1 mild facet degenerative changes and mild disc space narrowing.  No abnormal motion between flexion and extension.  Partial fusion sacroiliac joints bilaterally.  Marked left-sided and mild right-sided hip joint degenerative changes.  Electronically Signed   By: Genia Del M.D.   On: 05/27/2018 16:22  Complexity Note: Imaging results reviewed. Results shared with Mr. Krummel, using Layman's terms.                         Meds   Current Outpatient Medications:  .  aspirin EC 81 MG tablet, Take by mouth., Disp: , Rfl:  .  CANNABIDIOL PO, Take by mouth., Disp: , Rfl:  .  diclofenac (VOLTAREN) 75 MG EC tablet, Take 1 tablet (75 mg total) by mouth 2 (two) times daily., Disp: 60 tablet, Rfl: 1 .  traMADol (ULTRAM) 50 MG tablet, Take 2 tablets (100 mg total) by mouth 2 (two)  times daily as needed. To last for 30 days from fill date, Disp: 120 tablet, Rfl: 2 .  VENTOLIN HFA 108 (90 Base) MCG/ACT inhaler, , Disp: , Rfl:  .  tiZANidine (ZANAFLEX) 4 MG capsule, Take 4 mg by mouth every evening., Disp: , Rfl:   ROS  Constitutional: Denies any fever or chills Gastrointestinal: No reported hemesis, hematochezia, vomiting, or acute GI distress Musculoskeletal: Denies any acute onset joint swelling, redness, loss of ROM, or weakness Neurological: No reported episodes of acute onset apraxia, aphasia, dysarthria, agnosia, amnesia, paralysis, loss of coordination, or loss of consciousness  Allergies  Mr. Hamm has No Known Allergies.  [place]  Drug: Mr. Arch  reports that he has current or past drug history. Alcohol:  reports that he drank alcohol. Tobacco:  reports that he quit smoking about 4 months ago. He has never used smokeless tobacco. Medical:  has a past medical history of Diabetes mellitus without complication ([place]). Surgical: Mr. Cheese  has no past surgical history on file. Family: family history includes Arthritis in his mother; Diabetes in his father; Kidney disease in his father.  Constitutional Exam  General appearance: Well nourished, well developed, and well hydrated. In no apparent acute distress Vitals:   08/06/18 0942  BP: 117/76  Pulse: 71  Resp: 16  Temp: 97.8 F (36.6 C)  SpO2: 99%  Weight: 183 lb (83 kg)  Height: '5\' 11"'  (1.803 m)   BMI Assessment: Estimated body mass index is 25.52 kg/m as calculated from the following:   Height as of this encounter: '5\' 11"'  (1.803 m).   Weight as of this encounter: 183 lb (83 kg).  BMI interpretation table: BMI level Category Range association with higher incidence of chronic pain  <18 kg/m2 Underweight   18.5-24.9 kg/m2 Ideal body weight   25-29.9 kg/m2 Overweight Increased incidence by 20%  30-34.9 kg/m2 Obese (Class I) Increased incidence by 68%  35-39.9 kg/m2 Severe obesity (Class II)  Increased incidence by 136%  >40 kg/m2 Extreme obesity (Class III) Increased incidence by 254%   Patient's current BMI Ideal Body weight  Body mass index is 25.52 kg/m. Ideal body weight: 75.3 kg (166 lb 0.1 oz) Adjusted ideal body weight: 78.4 kg (172 lb 12.9 oz)   BMI Readings from Last 4 Encounters:  08/06/18 25.52 kg/m  07/03/18 25.66 kg/m  05/27/18 25.66 kg/m  04/23/18 25.80 kg/m   Wt Readings from Last 4 Encounters:  08/06/18 183 lb (83 kg)  05/27/18 184 lb (83.5 kg)  04/23/18 185 lb (83.9 kg)  Psych/Mental status: Alert, oriented x 3 (person, place, & time)       Eyes: PERLA Respiratory: No evidence of acute respiratory distress  Cervical Spine Area Exam  Skin & Axial Inspection: No masses, redness, edema, swelling, or associated skin lesions Alignment: Symmetrical Functional ROM: Decreased ROM, to the right Stability: No instability detected Muscle Tone/Strength: Functionally intact. No obvious neuro-muscular anomalies detected. Sensory (Neurological): Dermatomal pain pattern and musculoskeletal Palpation: No palpable anomalies             Positive Spurling's on the right Upper Extremity (UE) Exam    Side: Right upper extremity  Side: Left upper extremity  Skin & Extremity Inspection: Skin color, temperature, and hair growth are WNL. No peripheral edema or cyanosis. No masses, redness, swelling, asymmetry, or associated skin lesions. No contractures.  Skin & Extremity Inspection: Skin color, temperature, and hair growth are WNL. No peripheral edema or cyanosis. No masses, redness, swelling, asymmetry, or associated skin lesions. No contractures.  Functional ROM: Unrestricted ROM          Functional ROM: Unrestricted ROM          Muscle Tone/Strength: Functionally intact. No obvious neuro-muscular anomalies detected.  Muscle Tone/Strength: Functionally intact. No obvious neuro-muscular anomalies detected.  Sensory (Neurological): Dermatomal pain pattern          Sensory  (Neurological): Unimpaired          Palpation: No palpable anomalies              Palpation: No palpable anomalies              Provocative Test(s):  Phalen's test: deferred Tinel's test: deferred Apley's scratch test (touch opposite shoulder):  Action 1 (Across chest): Decreased ROM Action 2 (Overhead): Decreased ROM Action 3 (LB reach): Decreased ROM but   Provocative Test(s):  Phalen's test: deferred Tinel's test: deferred Apley's scratch test (touch opposite shoulder):  Action 1 (Across chest): deferred Action 2 (Overhead): deferred Action 3 (LB reach): deferred    Thoracic Spine Area Exam  Skin & Axial Inspection: No masses, redness, or swelling Alignment: Symmetrical Functional ROM: Unrestricted ROM Stability: No instability detected Muscle Tone/Strength: Functionally intact. No obvious neuro-muscular anomalies detected. Sensory (Neurological): Unimpaired Muscle strength & Tone: No palpable anomalies  Lumbar Spine Area Exam  Skin & Axial Inspection: No masses, redness, or swelling Alignment: Symmetrical Functional ROM: Unrestricted ROM       Stability: No instability detected Muscle Tone/Strength: Functionally intact. No obvious neuro-muscular anomalies detected. Sensory (Neurological): Unimpaired Palpation: No palpable anomalies       Provocative Tests: Hyperextension/rotation test: deferred today       Lumbar quadrant test (Kemp's test): deferred today       Lateral bending test: deferred today       Patrick's Maneuver: deferred today                   FABER test: deferred today                   S-I anterior distraction/compression test: deferred today         S-I lateral compression test: deferred today         S-I Thigh-thrust test: deferred today         S-I Gaenslen's test: deferred  today          Gait & Posture Assessment  Ambulation: Unassisted Gait: Relatively normal for age and body habitus Posture: WNL   Lower Extremity Exam    Side: Right lower  extremity  Side: Left lower extremity  Stability: No instability observed          Stability: No instability observed          Skin & Extremity Inspection: Skin color, temperature, and hair growth are WNL. No peripheral edema or cyanosis. No masses, redness, swelling, asymmetry, or associated skin lesions. No contractures.  Skin & Extremity Inspection: Skin color, temperature, and hair growth are WNL. No peripheral edema or cyanosis. No masses, redness, swelling, asymmetry, or associated skin lesions. No contractures.  Functional ROM: Unrestricted ROM                  Functional ROM: Unrestricted ROM                  Muscle Tone/Strength: Functionally intact. No obvious neuro-muscular anomalies detected.  Muscle Tone/Strength: Functionally intact. No obvious neuro-muscular anomalies detected.  Sensory (Neurological): Unimpaired  Sensory (Neurological): Unimpaired  Palpation: No palpable anomalies  Palpation: No palpable anomalies   Assessment  Primary Diagnosis & Pertinent Problem List: The primary encounter diagnosis was Cervical radiculopathy. Diagnoses of Cervical facet joint syndrome, Musculoskeletal pain, and Chronic pain syndrome were also pertinent to this visit.  Status Diagnosis  Having a Flare-up Persistent Persistent 1. Cervical radiculopathy   2. Cervical facet joint syndrome   3. Musculoskeletal pain   4. Chronic pain syndrome     General Recommendations: The pain condition that the patient suffers from is best treated with a multidisciplinary approach that involves an increase in physical activity to prevent de-conditioning and worsening of the pain cycle, as well as psychological counseling (formal and/or informal) to address the co-morbid psychological affects of pain. Treatment will often involve judicious use of pain medications and interventional procedures to decrease the pain, allowing the patient to participate in the physical activity that will ultimately produce  long-lasting pain reductions. The goal of the multidisciplinary approach is to return the patient to a higher level of overall function and to restore their ability to perform activities of daily living.  68 year old male presents with worsening right neck pain that radiates down to the patient's right shoulder, right bicep, right forearm and right hand in a dermatomal fashion.  Patient states that the pain is getting worse and he is having numbness and tingling in his right hand.  On physical exam patient does have positive Spurling's on the right.  Suggestive of right foraminal nerve impingement.  Discussed right cervical epidural steroid injection.  Risks and benefits were discussed.  Patient states that he will think about this further.  I will place a as needed order if the patient would like to proceed with a right C7-T1 ESI.  We also discussed alternative forms of medication therapy.  I did offer the patient buprenorphine in the form of belbuca to assist with his chronic pain.  Patient states that he prefers tramadol and will continue with his current dose.  Will provide refill as below.  Note: PMP checked and appropriate.  UDS up-to-date and appropriate.  Plan of Care  Pharmacotherapy (Medications Ordered): Meds ordered this encounter  Medications  . traMADol (ULTRAM) 50 MG tablet    Sig: Take 2 tablets (100 mg total) by mouth 2 (two) times daily as needed. To last for 30  days from fill date    Dispense:  120 tablet    Refill:  2   Lab-work, procedure(s), and/or referral(s): Orders Placed This Encounter  Procedures  . Cervical Epidural Injection   Provider-requested follow-up: Return in about 3 months (around 11/05/2018) for Medication Management.  Time Note: Greater than 50% of the 25 minute(s) of face-to-face time spent with Mr. Aldea, was spent in counseling/coordination of care regarding: Mr. Winningham primary cause of pain, the treatment plan, treatment alternatives, the risks and  possible complications of proposed treatment, medication side effects, the opioid analgesic risks and possible complications, the appropriate use of his medications, realistic expectations, the medication agreement and the patient's responsibilities when it comes to controlled substances.  Future Appointments  Date Time Provider [place]  10/24/2018  9:30 AM Gillis Santa, MD [facility] None    Primary Care Physician: Freddy Finner, NP [facility] Note by: Gillis Santa, M.D Date: 08/06/2018; Time: 2:36 PM  Patient Instructions     You have been given a script for Tramadol today    Preparing for Procedure with Sedation Instructions: . Oral Intake: Do not eat or drink anything for at least 8 hours prior to your procedure. . Transportation: Public transportation is not allowed. Bring an adult driver. The driver must be physically present in our waiting room before any procedure can be started. Marland Kitchen Physical Assistance: Bring an adult capable of physically assisting you, in the event you need help. . Blood Pressure Medicine: Take your blood pressure medicine with a sip of water the morning of the procedure. . Insulin: Take only  of your normal insulin dose. . Preventing infections: Shower with an antibacterial soap the morning of your procedure. . Build-up your immune system: Take 1000 mg of Vitamin C with every meal (3 times a day) the day prior to your procedure. . Pregnancy: If you are pregnant, call and cancel the procedure. . Sickness: If you have a cold, fever, or any active infections, call and cancel the procedure. . Arrival: You must be in the facility at least 30 minutes prior to your scheduled procedure. . Children: Do not bring children with you. . Dress appropriately: Bring dark clothing that you would not mind if they get stained. . Valuables: Do not bring any jewelry or valuables. Procedure appointments are reserved for  interventional treatments only. Marland Kitchen No Prescription Refills. . No medication changes will be discussed during procedure appointments. . No disability issues will be discussed.

## 2018-10-24 ENCOUNTER — Encounter: Payer: Medicare Other | Admitting: Student in an Organized Health Care Education/Training Program

## 2018-11-03 IMAGING — CR DG CERVICAL SPINE WITH FLEX & EXTEND
7 series · 7 of 7 positions shown · non-contrast
Comparison: None.

CLINICAL DATA: 67-year-old male with chronic low back, left hip and
neck pain. No reported trauma. Initial encounter.

EXAM:
CERVICAL SPINE COMPLETE WITH FLEXION AND EXTENSION VIEWS

[c-spine lat]
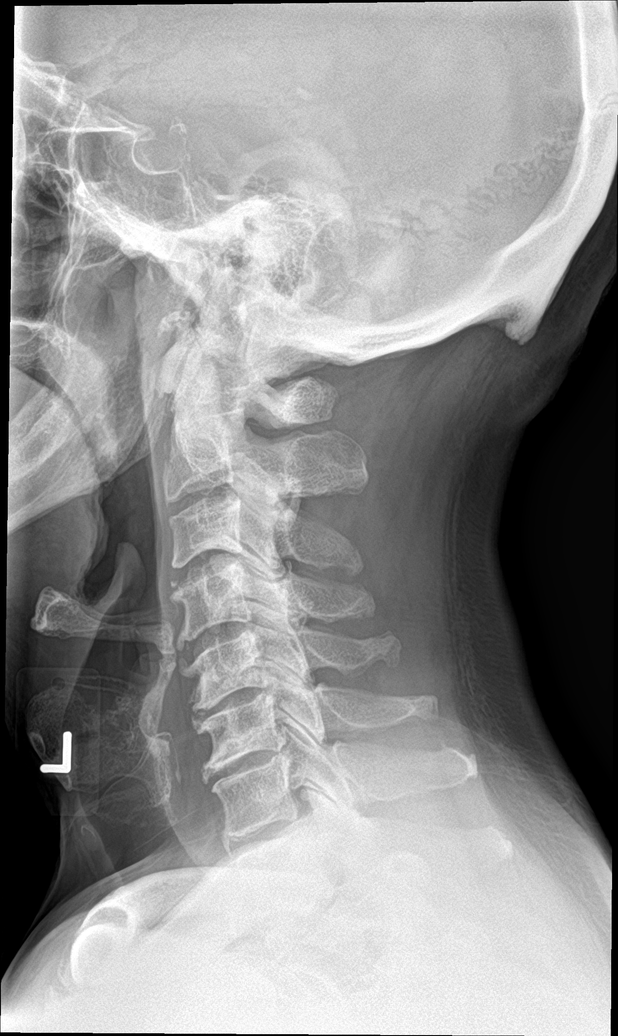

[c-spine flex]
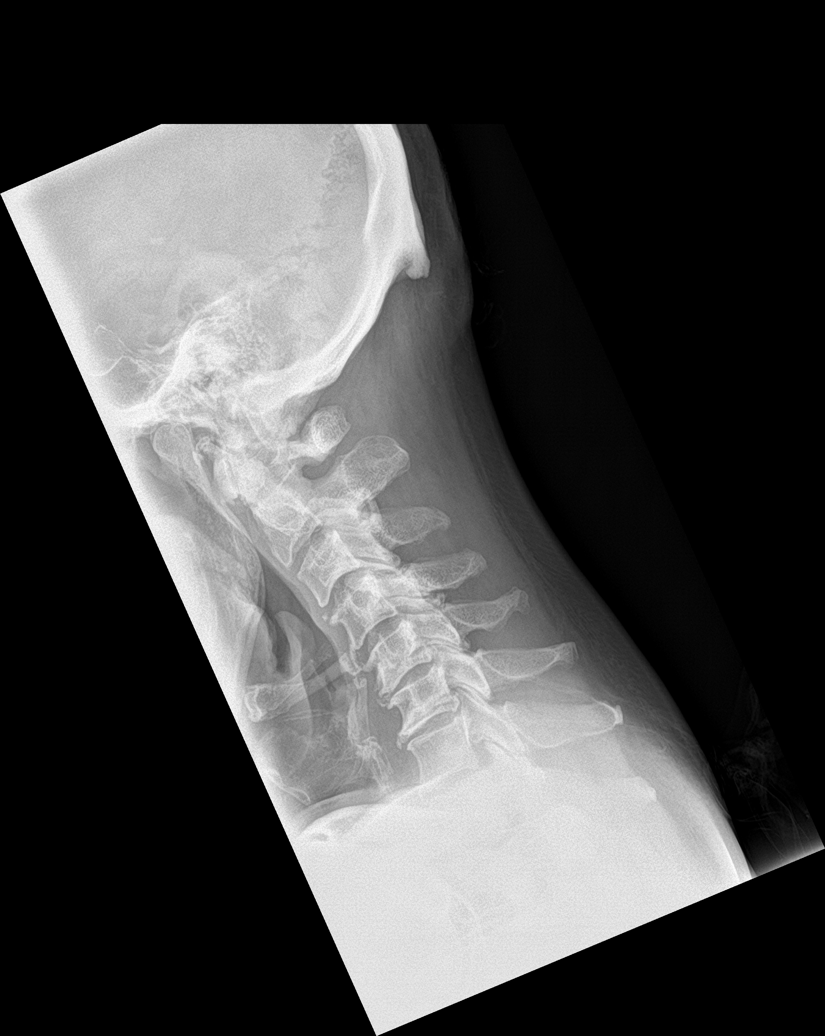

[c-spine ext]
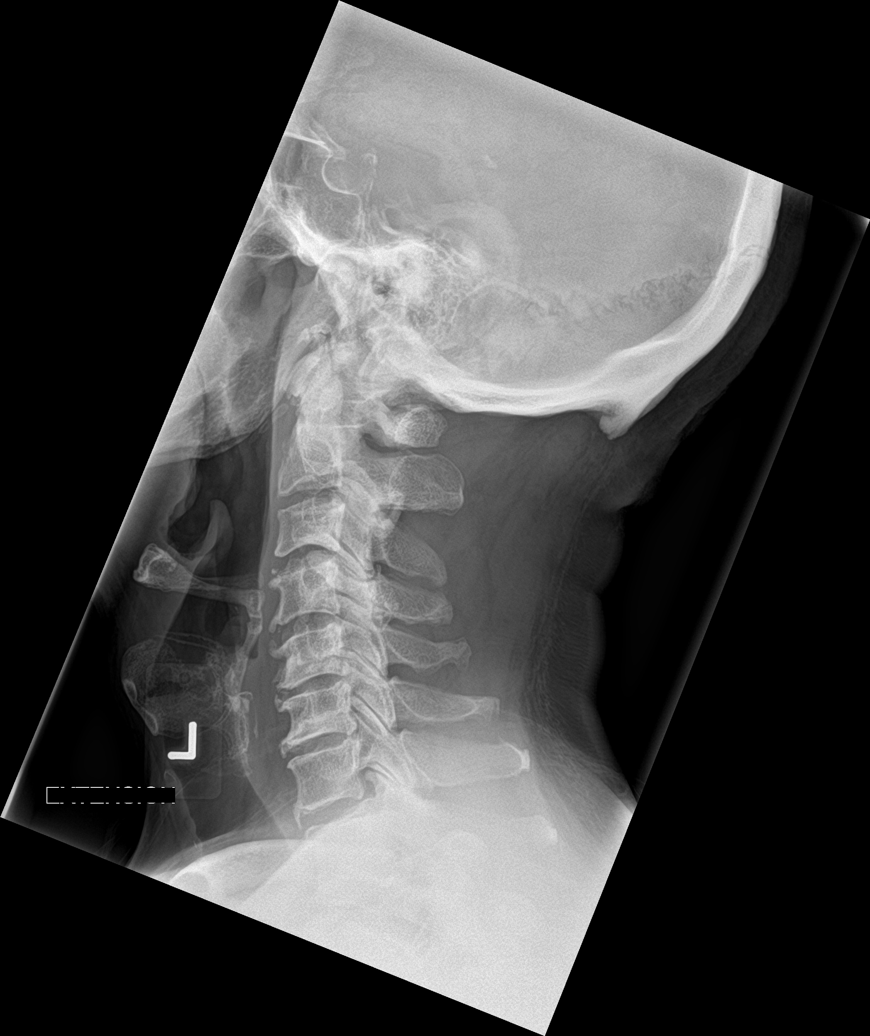

[c-spine obl (1 of 2)]
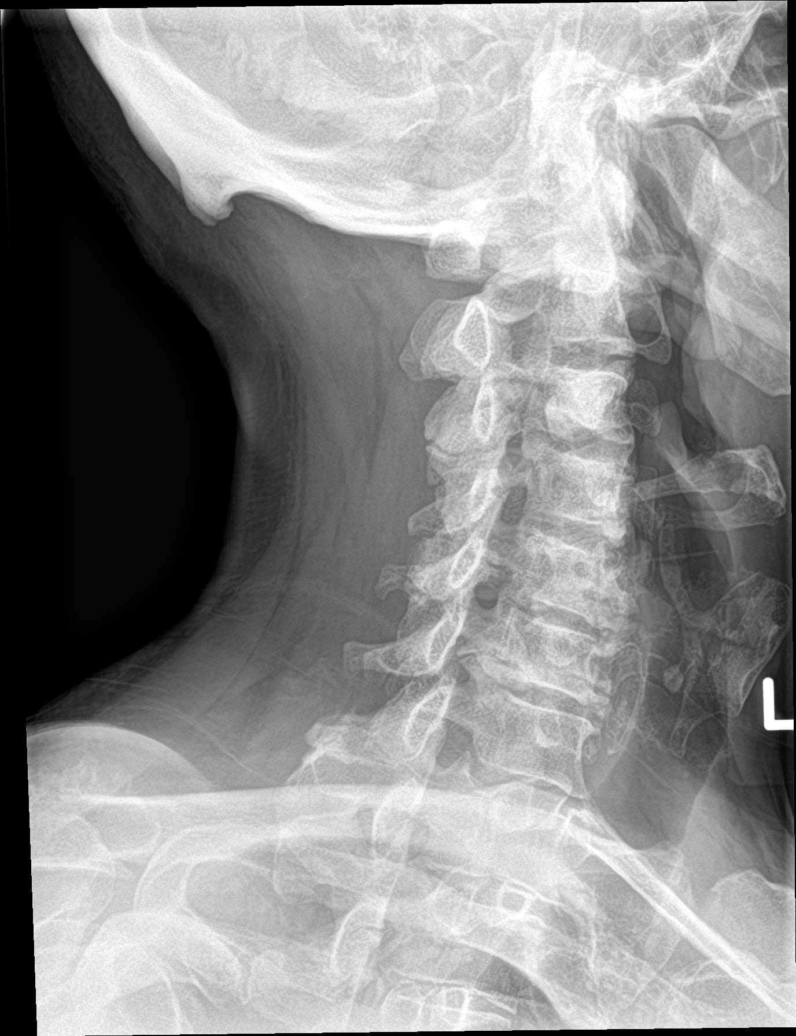

[c-spine obl (2 of 2)]
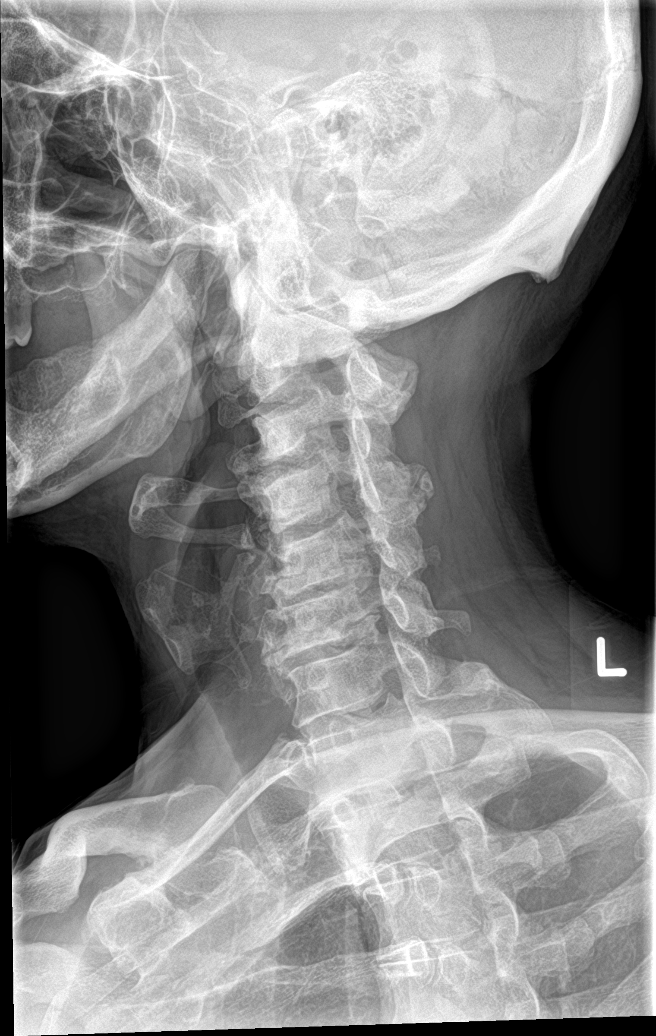

[c-spine ap]
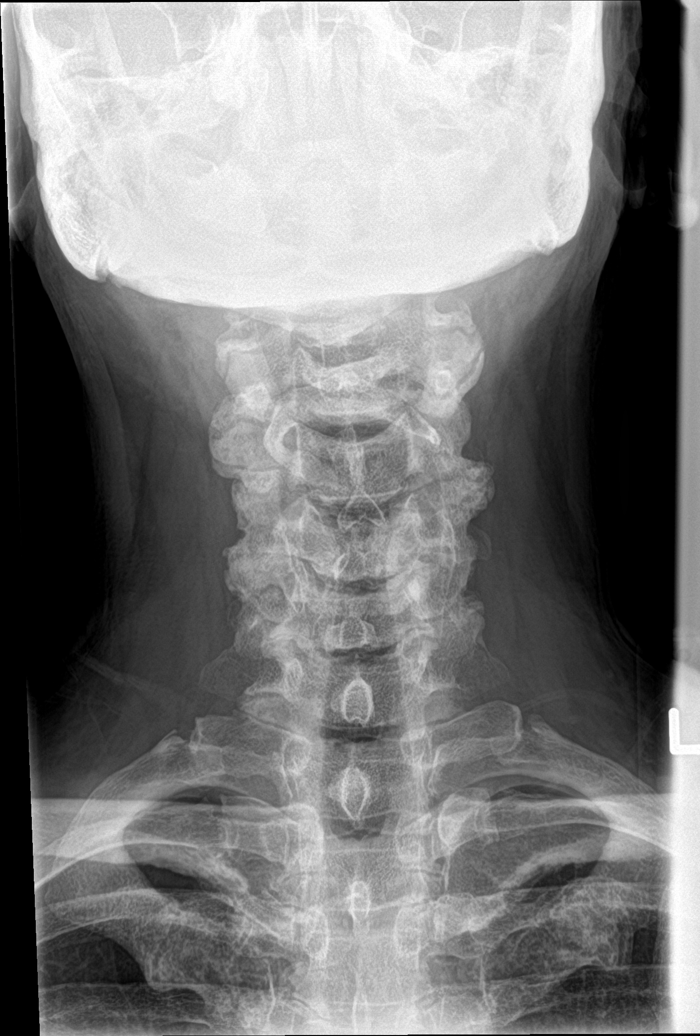

[c-spine open mouth]
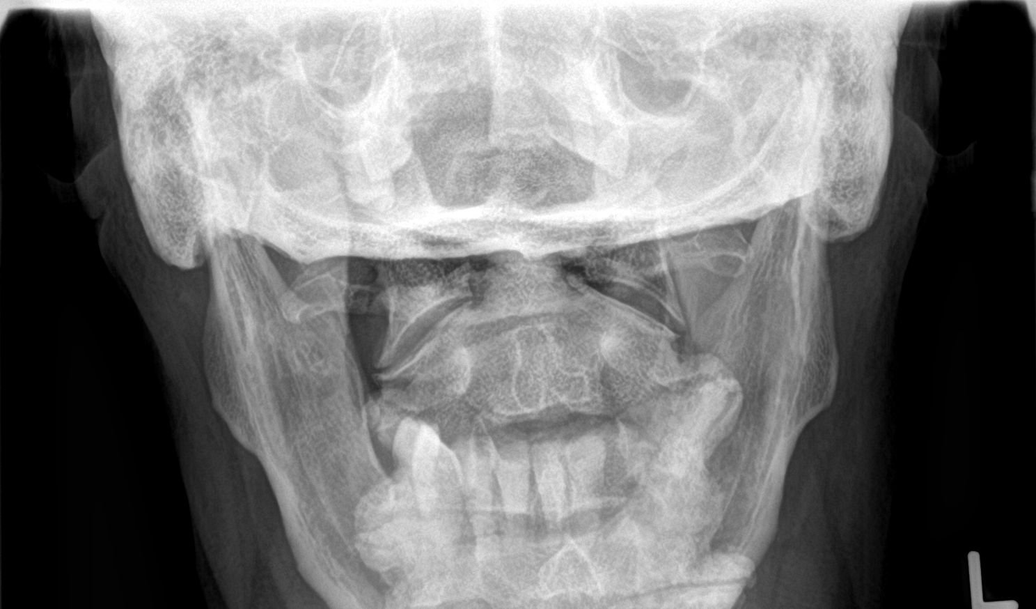

[7 of 7 positions shown; findings below may reference images not displayed]

FINDINGS: Straightening of the cervical spine.

Mild C5-6 and C6-7 disc space narrowing.

Facet degenerative changes and/or uncinate hypertrophy contribute to
right-sided C3-4 and C6-7 foraminal narrowing and left-sided C5-6
and C6-7 foraminal narrowing.

No abnormal motion between flexion and extension.

No lung apical lesion noted.

No fracture detected.
IMPRESSION: Straightening of the cervical spine.

Mild C5-6 and C6-7 disc space narrowing.

Facet degenerative changes and/or uncinate hypertrophy contribute to
right-sided C3-4 and C6-7 foraminal narrowing and left-sided C5-6
and C6-7 foraminal narrowing.

## 2018-11-03 IMAGING — CR DG SI JOINTS 3+V
3 series · 3 of 3 positions shown · non-contrast
Comparison: None.

CLINICAL DATA: 67-year-old male with chronic low back, left hip and
neck pain. No reported trauma. Initial encounter.

EXAM:
BILATERAL SACROILIAC JOINTS - 3+ VIEW

[si joints obl (1 of 2)]
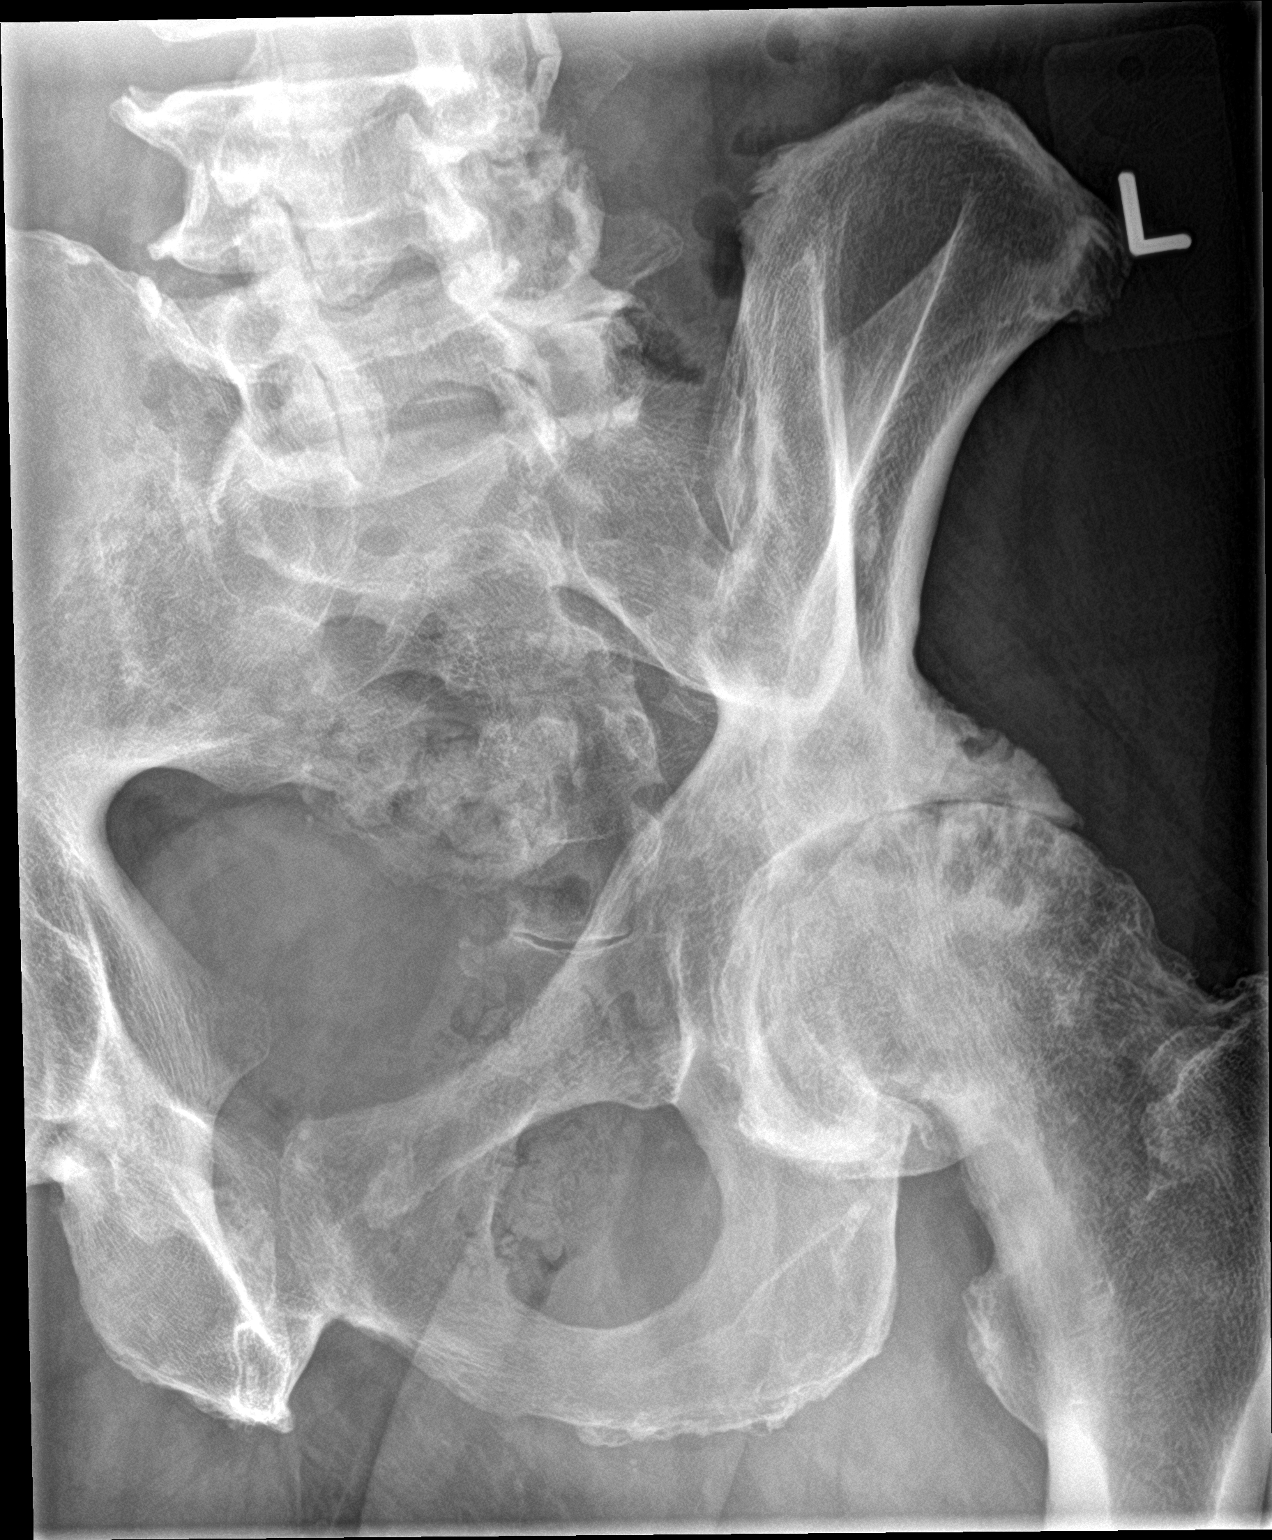

[si joints obl (2 of 2)]
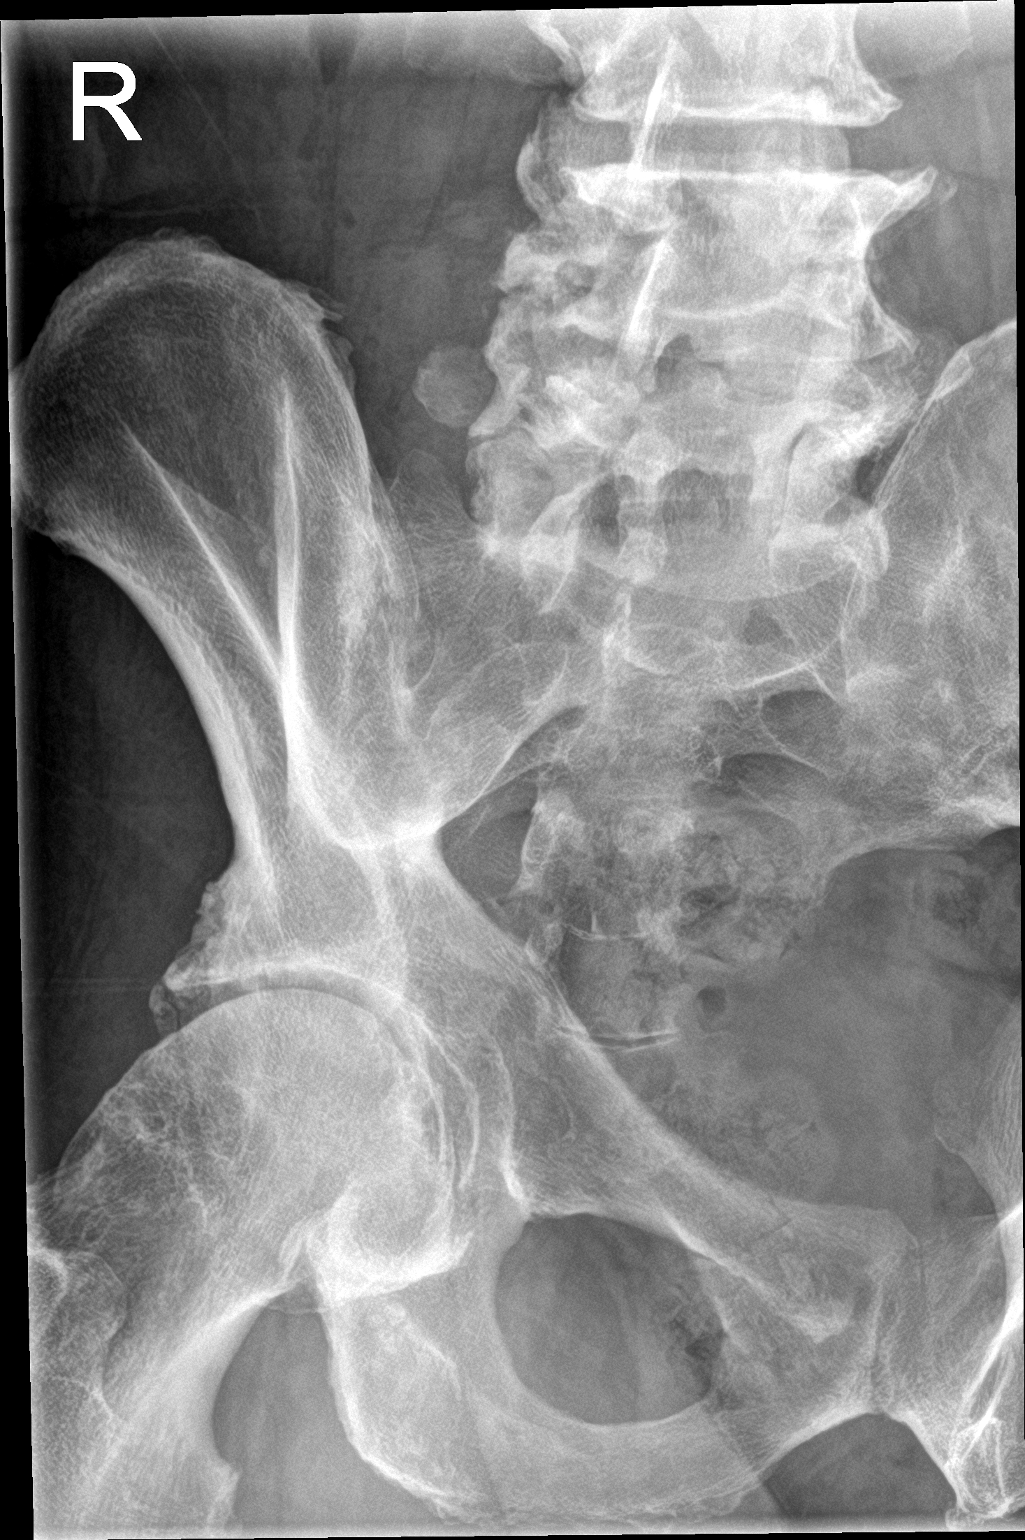

[si joints ap]
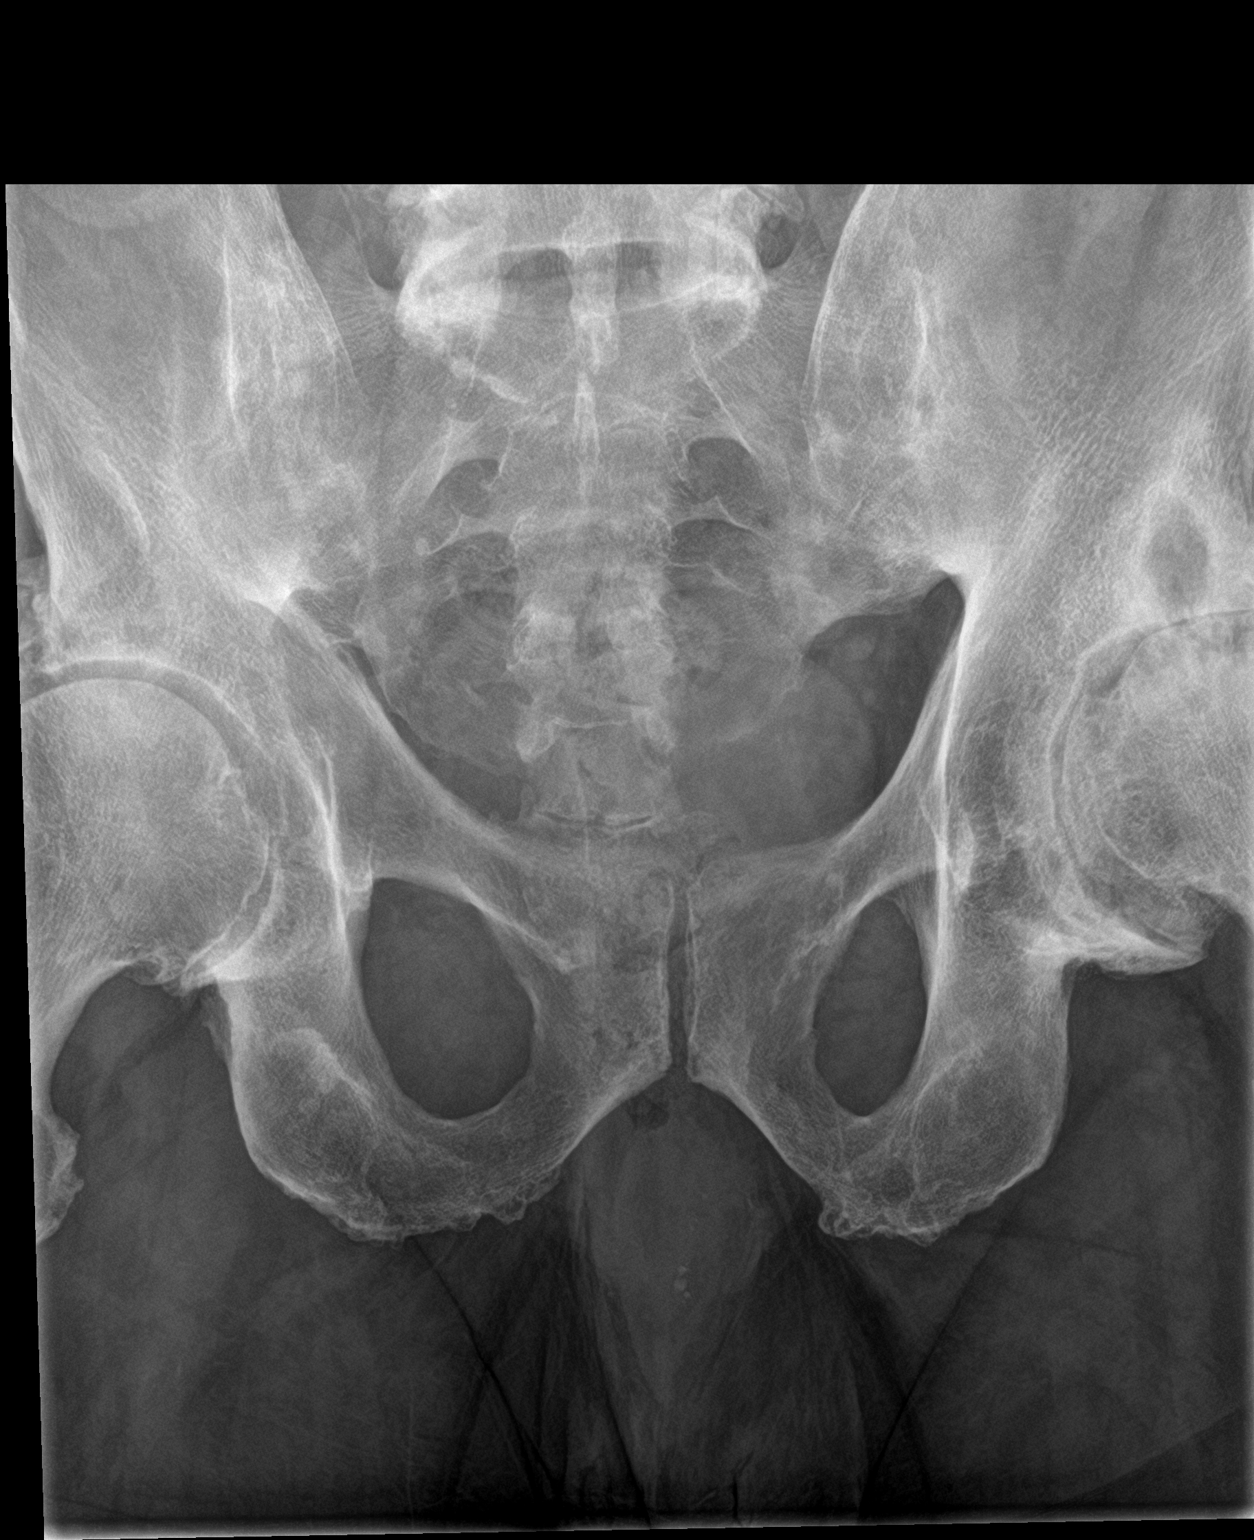

[3 of 3 positions shown; findings below may reference images not displayed]

FINDINGS: Marked left hip joint degenerative changes with joint space
narrowing, significant subchondral cysts and surrounding sclerosis.
Mild flattening left femoral head without collapse.

Mild right-sided hip joint degenerative changes.

Partial fusion sacroiliac joints bilaterally.
IMPRESSION: Marked left sided and mild right-sided hip joint degenerative
changes.

Partial fusion sacroiliac joints bilaterally.

## 2019-04-08 ENCOUNTER — Encounter: Payer: Self-pay | Admitting: Student in an Organized Health Care Education/Training Program

## 2019-04-09 ENCOUNTER — Other Ambulatory Visit: Payer: Self-pay

## 2019-04-09 ENCOUNTER — Ambulatory Visit
Payer: Medicare Other | Attending: Student in an Organized Health Care Education/Training Program | Admitting: Student in an Organized Health Care Education/Training Program

## 2019-04-09 ENCOUNTER — Encounter: Payer: Self-pay | Admitting: Student in an Organized Health Care Education/Training Program

## 2019-04-09 DIAGNOSIS — G894 Chronic pain syndrome: Secondary | ICD-10-CM

## 2019-04-09 DIAGNOSIS — M47812 Spondylosis without myelopathy or radiculopathy, cervical region: Secondary | ICD-10-CM | POA: Diagnosis not present

## 2019-04-09 DIAGNOSIS — M545 Low back pain: Secondary | ICD-10-CM

## 2019-04-09 DIAGNOSIS — M5459 Other low back pain: Secondary | ICD-10-CM

## 2019-04-09 DIAGNOSIS — M5412 Radiculopathy, cervical region: Secondary | ICD-10-CM

## 2019-04-09 DIAGNOSIS — M5136 Other intervertebral disc degeneration, lumbar region: Secondary | ICD-10-CM

## 2019-04-09 DIAGNOSIS — M7918 Myalgia, other site: Secondary | ICD-10-CM

## 2019-04-09 DIAGNOSIS — Z79899 Other long term (current) drug therapy: Secondary | ICD-10-CM

## 2019-04-09 DIAGNOSIS — M47816 Spondylosis without myelopathy or radiculopathy, lumbar region: Secondary | ICD-10-CM

## 2019-04-09 MED ORDER — HYDROCODONE-ACETAMINOPHEN 7.5-325 MG PO TABS: 1.0000 | ORAL_TABLET | Freq: Two times a day (BID) | ORAL | 0 refills | Status: AC | PRN

## 2019-04-09 NOTE — Progress Notes (Signed)
Pain Management Virtual Encounter Note - Virtual Visit via Telephone Telehealth (real-time audio visits between healthcare provider and patient).  Patient's Phone No. & Preferred Pharmacy:  [phone] (home); There is no such number on file (mobile).; (Preferred) [phone] No e-mail address on record  [name] [name] - [address] [place] [address] Phone: [phone] Fax: [phone]   Pre-screening note:  Our staff contacted [name] and offered him an "in person", "face-to-face" appointment versus a telephone encounter. He indicated preferring the telephone encounter, at this time.  Reason for Virtual Visit: COVID-19*  Social distancing based on CDC and AMA recommendations.   I contacted Andres Torres on 04/09/2019 at 11:33 AM via telephone.      I clearly identified myself as Andres JollyBilal Zayli Villafuerte, MD. I verified that I was speaking with the correct person using two identifiers (Name and date of birth: 01/25/1951).  Advanced Informed Consent I sought verbal advanced consent from Andres Torres for virtual visit interactions. I informed [name] of possible security and privacy concerns, risks, and limitations associated with providing "not-in-person" medical evaluation and management services. I also informed [name] of the availability of "in-person" appointments. Finally, I informed him that there would be a charge for the virtual visit and that he could be  personally, fully or partially, financially responsible for it. [name] expressed understanding and agreed to proceed.   Historic Elements   Mr. Andres Johnsdward Peddy is a 68 y.o. year old, male patient evaluated today after his last encounter by our practice on 10/24/2018. [name]  has a past medical history of Diabetes mellitus without complication (HCC). He also  has no past surgical history on file. [name] has a current medication list which includes the following  prescription(s): aspirin ec, cannabidiol, diclofenac, tizanidine, ventolin hfa, and hydrocodone-acetaminophen. He  reports that he quit smoking about 12 months ago. He has never used smokeless tobacco. He reports previous alcohol use. He reports previous drug use. [name] has No Known Allergies.   HPI  I last communicated with him on 10/24/2018. Today, he is being contacted for medication management.  Patient is endorsing increased neck and right shoulder pain that radiates into her right arm.  Patient's last prescription fill of tramadol from me was on 12/03/2018.  He did have a refill from his primary care provider on 02/12/2019 for quantity 60.  He states that even taking tramadol as I recommended at 100 mg twice daily was not very effective.  He is interested in considering an alternative.  Patient has been compliant with therapy and has reasonable expectations in regards to pain management.  Patient has tried gabapentin and Lyrica in the past and had side effects of confusion and cognitive dysfunction.  We also discussed cervical epidural steroid injection at right C7-T1.  Patient will consider and call if and when he would like to proceed.   Pharmacotherapy Assessment   02/12/2019  1   02/12/2019  Tramadol Hcl 50 MG Tablet  60.00 30 Je Rub   [id number]   Cha [phone])   0  10.00 MME  Comm Ins   [place]  12/03/2018  1   08/06/2018  Tramadol Hcl 50 MG Tablet  120.00 30 Un Pha   [id number]   Cha ([id number])   2  20.00 MME  Comm Ins   [place]     Monitoring: Pharmacotherapy: No side-effects or adverse reactions reported. [place] PMP: PDMP reviewed during this  encounter.       Compliance: No problems identified. Effectiveness: Clinically acceptable. Plan: Refer to "POC".  Pertinent Labs  Renal Function Lab Results  Component Value Date   BUN 12 04/23/2018   CREATININE 1.18 04/23/2018   BCR 10 04/23/2018   GFRAA 73 04/23/2018   GFRNONAA 63 04/23/2018   Hepatic Function Lab Results  Component Value Date   AST  18 04/23/2018   ALT 18 04/23/2018   ALBUMIN 4.5 04/23/2018   UDS Summary  Date Value Ref Range Status  04/23/2018 FINAL  Final    Comment:    ==================================================================== TOXASSURE COMP DRUG ANALYSIS,UR ==================================================================== Test                             Result       Flag       Units Drug Present and Declared for Prescription Verification   Tramadol                       >2841        EXPECTED   ng/mg creat   O-Desmethyltramadol            >2841        EXPECTED   ng/mg creat   N-Desmethyltramadol            1177         EXPECTED   ng/mg creat    Source of tramadol is a prescription medication.    O-desmethyltramadol and N-desmethyltramadol are expected    metabolites of tramadol. Drug Present not Declared for Prescription Verification   Carboxy-THC                    18           UNEXPECTED ng/mg creat    Carboxy-THC is a metabolite of tetrahydrocannabinol  (THC).    Source of [facility] is most commonly illicit, but THC is also present    in a scheduled prescription medication.   Acetaminophen                  PRESENT      UNEXPECTED Drug Absent but Declared for Prescription Verification   Tizanidine                     Not Detected UNEXPECTED    Tizanidine, as indicated in the declared medication list, is not    always detected even when used as directed.   Diclofenac                     Not Detected UNEXPECTED    Diclofenac, as indicated in the declared medication list, is not    always detected even when used as directed.   Salicylate                     Not Detected UNEXPECTED    Aspirin, as indicated in the declared medication list, is not    always detected even when used as directed. ==================================================================== Test                      Result    Flag   Units      Ref Range   Creatinine              176  mg/dL       >=21 ==================================================================== Declared Medications:  The flagging and interpretation on this report are based on the  following declared medications.  Unexpected results may arise from  inaccuracies in the declared medications.  **Note: The testing scope of this panel includes these medications:  Tramadol (Ultram)  **Note: The testing scope of this panel does not include small to  moderate amounts of these reported medications:  Aspirin (Aspirin 81)  Diclofenac (Voltaren)  Tizanidine (Zanaflex)  **Note: The testing scope of this panel does not include following  reported medications:  Albuterol (Ventolin HFA) ==================================================================== For clinical consultation, please call [phone]. ====================================================================    Note: Above Lab results reviewed. Patient uses CBD oil, low qt level consistent with CBD usage Recent imaging  DG Cervical Spine With Flex & Extend CLINICAL DATA:  68 year old male with chronic low back, left hip and neck pain. No reported trauma. Initial encounter.  EXAM: CERVICAL SPINE COMPLETE WITH FLEXION AND EXTENSION VIEWS  COMPARISON:  None.  FINDINGS: Straightening of the cervical spine.  Mild C5-6 and C6-7 disc space narrowing.  Facet degenerative changes and/or uncinate hypertrophy contribute to right-sided C3-4 and C6-7 foraminal narrowing and left-sided C5-6 and C6-7 foraminal narrowing.  No abnormal motion between flexion and extension.  No lung apical lesion noted.  No fracture detected.  IMPRESSION: Straightening of the cervical spine.  Mild C5-6 and C6-7 disc space narrowing.  Facet degenerative changes and/or uncinate hypertrophy contribute to right-sided C3-4 and C6-7 foraminal narrowing and left-sided C5-6 and C6-7 foraminal narrowing.  Electronically Signed   By: Lacy Duverney M.D.   On: 05/27/2018  16:28 DG Si Joints CLINICAL DATA:  68 year old male with chronic low back, left hip and neck pain. No reported trauma. Initial encounter.  EXAM: BILATERAL SACROILIAC JOINTS - 3+ VIEW  COMPARISON:  None.  FINDINGS: Marked left hip joint degenerative changes with joint space narrowing, significant subchondral cysts and surrounding sclerosis. Mild flattening left femoral head without collapse.  Mild right-sided hip joint degenerative changes.  Partial fusion sacroiliac joints bilaterally.  IMPRESSION: Marked left sided and mild right-sided hip joint degenerative changes.  Partial fusion sacroiliac joints bilaterally.  Electronically Signed   By: Lacy Duverney M.D.   On: 05/27/2018 16:24 DG Lumbar Spine Complete W/Bend CLINICAL DATA:  68 year old male with chronic low back, left hip and neck pain. No reported trauma. Initial encounter.  EXAM: LUMBAR SPINE - COMPLETE WITH BENDING VIEWS  COMPARISON:  None.  FINDINGS: 3 mm anterior slip L4 secondary to facet degenerative changes. Pars defect not visualized. Very mild L4-5 disc space narrowing.  L3-4 and L5-S1 mild facet degenerative changes and mild disc space narrowing.  No compression fracture.  No abnormal motion between flexion and extension.  Partial fusion sacroiliac joints bilaterally.  Marked left-sided and mild right-sided hip joint degenerative changes.  Vascular calcifications.  IMPRESSION: 3 mm anterior slip L4 secondary to facet degenerative changes. Very mild L4-5 disc space narrowing.  L3-4 and L5-S1 mild facet degenerative changes and mild disc space narrowing.  No abnormal motion between flexion and extension.  Partial fusion sacroiliac joints bilaterally.  Marked left-sided and mild right-sided hip joint degenerative changes.  Electronically Signed   By: Lacy Duverney M.D.   On: 05/27/2018 16:22  Assessment  The primary encounter diagnosis was Cervical radiculopathy. Diagnoses of  Cervical facet joint syndrome, Musculoskeletal pain, Chronic pain syndrome, Lumbar spondylosis, Lumbar facet joint pain, Lumbar degenerative disc disease, and Pharmacologic therapy were also pertinent to this visit.  Plan of Care  I am having Andres Johns start on HYDROcodone-acetaminophen. I am also having him maintain his Ventolin HFA, aspirin EC, diclofenac, tiZANidine, and CANNABIDIOL PO.  Pharmacotherapy (Medications Ordered): Meds ordered this encounter  Medications  . HYDROcodone-acetaminophen (NORCO) 7.5-325 MG tablet    Sig: Take 1 tablet by mouth every 12 (twelve) hours as needed for up to 30 days for severe pain. Must last 30 days.    Dispense:  60 tablet    Refill:  0    [place] STOP ACT - Not applicable. Fill one day early if pharmacy is closed on scheduled refill date.   Orders:  Orders Placed This Encounter  Procedures  . Cervical Epidural Injection    CLINICAL INDICATIONS: Intended for the treatment of Mr. Sagan upper extremity pain secondary to radiculitis, with or without neck pain.    Standing Status:   Standing    Number of Occurrences:   6    Standing Expiration Date:   10/09/2020    Scheduling Instructions:     LEVEL: C7-T1     LATERALITY: Right-sided     SEDATION: Patient's choice.     TIMEFRAME: PRN procedure. (Mr. Gramm will call when needed.)    Order Specific Question:   Where will this procedure be performed?    Answer:   [facility]    Comments:   Jefferey Lippmann   Follow-up plan:   Return if symptoms worsen or fail to improve, for Procedure.    I discussed the assessment and treatment plan with the patient. The patient was provided an opportunity to ask questions and all were answered. The patient agreed with the plan and demonstrated an understanding of the instructions.  Patient advised to call back or seek an in-person evaluation if the symptoms or condition worsens.  Total duration of non-face-to-face encounter: 25 minutes.  Note by: Shamal Jolly, MD Date: 04/09/2019; Time: 11:33 AM  Note: This dictation was prepared with Dragon dictation. Any transcriptional errors that may result from this process are unintentional.  Disclaimer:  * Given the special circumstances of the COVID-19 pandemic, the federal government has announced that the Office for Civil Rights (OCR) will exercise its enforcement discretion and will not impose penalties on physicians using telehealth in the event of noncompliance with regulatory requirements under the [employer] Portability and Accountability Act (HIPAA) in connection with the good faith provision of telehealth during the COVID-19 national public health emergency. (AMA)

## 2019-08-09 ENCOUNTER — Emergency Department: Payer: Medicare Other

## 2019-08-09 ENCOUNTER — Other Ambulatory Visit: Payer: Self-pay

## 2019-08-09 ENCOUNTER — Inpatient Hospital Stay
Admission: EM | Admit: 2019-08-09 | Discharge: 2019-08-11 | DRG: 310 | Discharge status: Against Medical Advice | Payer: Medicare Other | Attending: Internal Medicine | Admitting: Internal Medicine

## 2019-08-09 DIAGNOSIS — Z66 Do not resuscitate: Secondary | ICD-10-CM | POA: Diagnosis present

## 2019-08-09 DIAGNOSIS — J432 Centrilobular emphysema: Secondary | ICD-10-CM | POA: Diagnosis not present

## 2019-08-09 DIAGNOSIS — Z20828 Contact with and (suspected) exposure to other viral communicable diseases: Secondary | ICD-10-CM | POA: Diagnosis not present

## 2019-08-09 DIAGNOSIS — I10 Essential (primary) hypertension: Secondary | ICD-10-CM | POA: Diagnosis not present

## 2019-08-09 DIAGNOSIS — R7303 Prediabetes: Secondary | ICD-10-CM | POA: Diagnosis not present

## 2019-08-09 DIAGNOSIS — M5136 Other intervertebral disc degeneration, lumbar region: Secondary | ICD-10-CM | POA: Diagnosis not present

## 2019-08-09 DIAGNOSIS — Z716 Tobacco abuse counseling: Secondary | ICD-10-CM | POA: Diagnosis not present

## 2019-08-09 DIAGNOSIS — F17210 Nicotine dependence, cigarettes, uncomplicated: Secondary | ICD-10-CM | POA: Diagnosis present

## 2019-08-09 DIAGNOSIS — I160 Hypertensive urgency: Secondary | ICD-10-CM | POA: Diagnosis not present

## 2019-08-09 DIAGNOSIS — Z5329 Procedure and treatment not carried out because of patient's decision for other reasons: Secondary | ICD-10-CM | POA: Diagnosis not present

## 2019-08-09 DIAGNOSIS — Z8261 Family history of arthritis: Secondary | ICD-10-CM | POA: Diagnosis not present

## 2019-08-09 DIAGNOSIS — I442 Atrioventricular block, complete: Secondary | ICD-10-CM | POA: Diagnosis not present

## 2019-08-09 DIAGNOSIS — M1612 Unilateral primary osteoarthritis, left hip: Secondary | ICD-10-CM | POA: Diagnosis not present

## 2019-08-09 DIAGNOSIS — I498 Other specified cardiac arrhythmias: Secondary | ICD-10-CM | POA: Diagnosis present

## 2019-08-09 DIAGNOSIS — Z79899 Other long term (current) drug therapy: Secondary | ICD-10-CM

## 2019-08-09 DIAGNOSIS — R001 Bradycardia, unspecified: Secondary | ICD-10-CM | POA: Diagnosis present

## 2019-08-09 DIAGNOSIS — R0602 Shortness of breath: Secondary | ICD-10-CM

## 2019-08-09 DIAGNOSIS — Z833 Family history of diabetes mellitus: Secondary | ICD-10-CM

## 2019-08-09 DIAGNOSIS — M1990 Unspecified osteoarthritis, unspecified site: Secondary | ICD-10-CM | POA: Diagnosis not present

## 2019-08-09 DIAGNOSIS — Z841 Family history of disorders of kidney and ureter: Secondary | ICD-10-CM

## 2019-08-09 DIAGNOSIS — R0609 Other forms of dyspnea: Secondary | ICD-10-CM

## 2019-08-09 DIAGNOSIS — M25511 Pain in right shoulder: Secondary | ICD-10-CM | POA: Diagnosis not present

## 2019-08-09 DIAGNOSIS — G894 Chronic pain syndrome: Secondary | ICD-10-CM | POA: Diagnosis not present

## 2019-08-09 DIAGNOSIS — Z791 Long term (current) use of non-steroidal anti-inflammatories (NSAID): Secondary | ICD-10-CM | POA: Diagnosis not present

## 2019-08-09 LAB — CBC WITH DIFFERENTIAL/PLATELET
Abs Immature Granulocytes: 0.01 10*3/uL (ref 0.00–0.07)
Basophils Absolute: 0 10*3/uL (ref 0.0–0.1)
Basophils Relative: 1 %
Eosinophils Absolute: 0.1 10*3/uL (ref 0.0–0.5)
Eosinophils Relative: 1 %
HCT: 43.6 % (ref 39.0–52.0)
Hemoglobin: 14.6 g/dL (ref 13.0–17.0)
Immature Granulocytes: 0 %
Lymphocytes Relative: 27 %
Lymphs Abs: 2.3 10*3/uL (ref 0.7–4.0)
MCH: 31.7 pg (ref 26.0–34.0)
MCHC: 33.5 g/dL (ref 30.0–36.0)
MCV: 94.8 fL (ref 80.0–100.0)
Monocytes Absolute: 0.9 10*3/uL (ref 0.1–1.0)
Monocytes Relative: 10 %
Neutro Abs: 5.3 10*3/uL (ref 1.7–7.7)
Neutrophils Relative %: 61 %
Platelets: 213 10*3/uL (ref 150–400)
RBC: 4.6 MIL/uL (ref 4.22–5.81)
RDW: 13.7 % (ref 11.5–15.5)
WBC: 8.7 10*3/uL (ref 4.0–10.5)
nRBC: 0 % (ref 0.0–0.2)

## 2019-08-09 LAB — GLUCOSE, CAPILLARY: Glucose-Capillary: 220 mg/dL — ABNORMAL HIGH (ref 70–99)

## 2019-08-09 LAB — TROPONIN I (HIGH SENSITIVITY)
Troponin I (High Sensitivity): 11 ng/L (ref <18)
Troponin I (High Sensitivity): 13 ng/L (ref <18)

## 2019-08-09 LAB — COMPREHENSIVE METABOLIC PANEL
ALT: 43 U/L (ref 0–44)
AST: 31 U/L (ref 15–41)
Albumin: 4.3 g/dL (ref 3.5–5.0)
Alkaline Phosphatase: 56 U/L (ref 38–126)
Anion gap: 10 (ref 5–15)
BUN: 13 mg/dL (ref 8–23)
CO2: 25 mmol/L (ref 22–32)
Calcium: 9.4 mg/dL (ref 8.9–10.3)
Chloride: 105 mmol/L (ref 98–111)
Creatinine, Ser: 1.21 mg/dL (ref 0.61–1.24)
GFR calc Af Amer: >60 mL/min (ref >60)
GFR calc non Af Amer: >60 mL/min (ref >60)
Glucose, Bld: 172 mg/dL — ABNORMAL HIGH (ref 70–99)
Potassium: 4.1 mmol/L (ref 3.5–5.1)
Sodium: 140 mmol/L (ref 135–145)
Total Bilirubin: 1.5 mg/dL — ABNORMAL HIGH (ref 0.3–1.2)
Total Protein: 7.9 g/dL (ref 6.5–8.1)

## 2019-08-09 LAB — SARS CORONAVIRUS 2 BY RT PCR (HOSPITAL ORDER, PERFORMED IN ~~LOC~~ HOSPITAL LAB): SARS Coronavirus 2: NEGATIVE

## 2019-08-09 LAB — PHOSPHORUS: Phosphorus: 3.3 mg/dL (ref 2.5–4.6)

## 2019-08-09 LAB — MAGNESIUM: Magnesium: 2.2 mg/dL (ref 1.7–2.4)

## 2019-08-09 LAB — BRAIN NATRIURETIC PEPTIDE: B Natriuretic Peptide: 615 pg/mL — ABNORMAL HIGH (ref 0.0–100.0)

## 2019-08-09 MED ORDER — ALBUTEROL SULFATE HFA 108 (90 BASE) MCG/ACT IN AERS: 2.0000 | INHALATION_SPRAY | Freq: Four times a day (QID) | RESPIRATORY_TRACT | Status: DC | PRN

## 2019-08-09 MED ORDER — NICOTINE 14 MG/24HR TD PT24
14.0000 mg | MEDICATED_PATCH | Freq: Every day | TRANSDERMAL | Status: DC
  Administered 2019-08-09 – 2019-08-10 (×2): 14 mg via TRANSDERMAL
  Filled 2019-08-09 (×2): qty 1

## 2019-08-09 MED ORDER — CHLORHEXIDINE GLUCONATE CLOTH 2 % EX PADS
6.0000 | MEDICATED_PAD | Freq: Every day | CUTANEOUS | Status: DC
  Administered 2019-08-09 – 2019-08-10 (×2): 6 via TOPICAL

## 2019-08-09 MED ORDER — HYDROCODONE-ACETAMINOPHEN 5-325 MG PO TABS
1.0000 | ORAL_TABLET | ORAL | Status: DC | PRN
  Administered 2019-08-09: 2 via ORAL
  Filled 2019-08-09: qty 2

## 2019-08-09 MED ORDER — ADULT MULTIVITAMIN W/MINERALS CH
1.0000 | ORAL_TABLET | Freq: Every day | ORAL | Status: DC
  Administered 2019-08-09 – 2019-08-10 (×2): 1 via ORAL
  Filled 2019-08-09 (×2): qty 1

## 2019-08-09 MED ORDER — ALBUTEROL SULFATE (2.5 MG/3ML) 0.083% IN NEBU: 2.5000 mg | INHALATION_SOLUTION | Freq: Four times a day (QID) | RESPIRATORY_TRACT | Status: DC | PRN

## 2019-08-09 MED ORDER — HYDRALAZINE HCL 20 MG/ML IJ SOLN
10.0000 mg | Freq: Four times a day (QID) | INTRAMUSCULAR | Status: DC | PRN
  Administered 2019-08-09 – 2019-08-10 (×2): 10 mg via INTRAVENOUS
  Filled 2019-08-09 (×2): qty 1

## 2019-08-09 MED ORDER — MELATONIN 5 MG PO TABS
5.0000 mg | ORAL_TABLET | Freq: Every day | ORAL | Status: DC
  Administered 2019-08-10: 5 mg via ORAL
  Filled 2019-08-09 (×4): qty 1

## 2019-08-09 MED ORDER — HYDRALAZINE HCL 50 MG PO TABS
50.0000 mg | ORAL_TABLET | Freq: Three times a day (TID) | ORAL | Status: DC
  Administered 2019-08-09: 50 mg via ORAL
  Filled 2019-08-09 (×3): qty 1

## 2019-08-09 MED ORDER — TRAMADOL HCL 50 MG PO TABS
50.0000 mg | ORAL_TABLET | Freq: Four times a day (QID) | ORAL | Status: DC | PRN
  Administered 2019-08-09 – 2019-08-10 (×2): 50 mg via ORAL
  Filled 2019-08-09 (×2): qty 1

## 2019-08-09 MED ORDER — VITAMIN C 500 MG PO TABS
250.0000 mg | ORAL_TABLET | Freq: Every day | ORAL | Status: DC
  Administered 2019-08-10: 250 mg via ORAL
  Filled 2019-08-09: qty 1

## 2019-08-09 MED ORDER — NON FORMULARY: 5.0000 mg | Freq: Every day | Status: DC

## 2019-08-09 NOTE — ED Provider Notes (Addendum)
[facility] Emergency Department Provider Note  ____________________________________________   None    (approximate)  I have reviewed the triage vital signs and the nursing notes.  History  Chief Complaint Bradycardia    HPI Andres Torres is a 68 y.o. male with a history of diabetes, chronic neck and hip pain, who presents the emergency department from clinic for bradycardia, SOB, DOE.  Patient was in the clinic for evaluation of progressive shortness of breath over the last several weeks, particularly on exertion.  In the clinic, he was noted to be bradycardic, with EKG notable for HR 38, second-degree type II block.  On arrival to the emergency department, his HR is in the 40s, appears to be in complete heart block.   He denies any associated chest pain.  He is not on any beta-blockers or calcium channel blockers.   Past Medical Hx Past Medical History:  Diagnosis Date  . Diabetes mellitus without complication [facility])     Problem List Patient Active Problem List   Diagnosis Date Noted  . Lumbar spondylosis 07/03/2018  . Lumbar facet joint pain 07/03/2018  . Cervical facet joint syndrome 07/03/2018  . Chronic pain syndrome 04/23/2018  . Lumbar degenerative disc disease 04/23/2018  . Chronic right shoulder pain 04/23/2018  . Musculoskeletal pain 04/23/2018  . Pharmacologic therapy 04/23/2018  . Neck pain 04/23/2018  . Localized osteoarthrosis of left hip 10/22/2013  . Foot arch pain 10/22/2013    Past Surgical Hx No past surgical history on file.  Medications Prior to Admission medications   Medication Sig Start Date End Date Taking? Authorizing Provider  aspirin EC 81 MG tablet Take by mouth. 07/21/16   [provider]  CANNABIDIOL PO Take by mouth.    [provider]  diclofenac (VOLTAREN) 75 MG EC tablet Take 1 tablet (75 mg total) by mouth 2 (two) times daily. 04/23/18   Valentin Jolly, MD  tiZANidine (ZANAFLEX) 4 MG  capsule Take 4 mg by mouth every evening.    [provider]  VENTOLIN HFA 108 [phone] Base) MCG/ACT inhaler  03/22/18   [provider]    Allergies Patient has no known allergies.  Family Hx Family History  Problem Relation Age of Onset  . Arthritis Mother   . Diabetes Father   . Kidney disease Father     Social Hx Social History   Tobacco Use  . Smoking status: Former Smoker    Quit date: 03/24/2018    Years since quitting: 1.3  . Smokeless tobacco: Never Used  Substance Use Topics  . Alcohol use: Not Currently  . Drug use: Not Currently    Comment: 50 years ago     Review of Systems  Constitutional: Negative for fever, chills. Eyes: Negative for visual changes. ENT: Negative for sore throat. Cardiovascular: Negative for chest pain. Respiratory: + for shortness of breath. Gastrointestinal: Negative for nausea, vomiting.  Genitourinary: Negative for dysuria. Musculoskeletal: Negative for leg swelling. Skin: Negative for rash. Neurological: Negative for for headaches.   Physical Exam  Vital Signs: ED Triage Vitals  Enc Vitals Group     BP 08/09/19 1227 (!) 221/65     Pulse Rate 08/09/19 1227 (!) 44     Resp 08/09/19 1227 15     Temp --      Temp src --      SpO2 08/09/19 1227 100 %     Weight 08/09/19 1228 185 lb (83.9 kg)     Height 08/09/19  1228 5\' 11"  (1.803 m)     Head Circumference --      Peak Flow --      Pain Score 08/09/19 1227 0     Pain Loc --      Pain Edu? --      Excl. in [place]? --     Constitutional: Alert and oriented.  Head: Normocephalic. Atraumatic. Eyes: Conjunctivae clear. Sclera anicteric. Nose: No congestion. No rhinorrhea. Mouth/Throat: Mucous membranes are moist.  Neck: No stridor.   Cardiovascular: Bradycardic.  Hypertensive.  Extremities well perfused. Respiratory: Normal respiratory effort.  Lungs CTAB. Gastrointestinal: Soft. Non-tender. Non-distended.  Musculoskeletal: No lower extremity edema. No  deformities. Neurologic:  Normal speech and language. No gross focal neurologic deficits are appreciated.  Skin: Skin is warm, dry and intact. No rash noted. Psychiatric: Mood and affect are appropriate for situation.  EKG  Personally reviewed.   Rate: 43, bradycardic Rhythm: Complete heart block Axis: LAD Intervals: Wide QRS due to right bundle branch block Complete heart block, with heart rate 43, right bundle branch block No STEMI    Radiology  XR: IMPRESSION: No acute abnormalities identified.     Procedures  Procedure(s) performed (including critical care):  .Critical Care Performed by: Lilia Pro., MD Authorized by: Lilia Pro., MD   Critical care provider statement:    Critical care time (minutes):  45   Critical care was necessary to treat or prevent imminent or life-threatening deterioration of the following conditions:  Cardiac failure   Critical care was time spent personally by me on the following activities:  Discussions with consultants, evaluation of patient's response to treatment, examination of patient, ordering and performing treatments and interventions, ordering and review of laboratory studies, ordering and review of radiographic studies, pulse oximetry, re-evaluation of patient's condition, obtaining history from patient or surrogate and review of old charts    Initial Impression / Assessment and Plan / ED Course  68 y.o. male who presents to the ED for shortness of breath, dyspnea on exertion for the last several weeks, found to be in complete heart block.  On exam he is bradycardic, but otherwise hypertensive and HDS.  Plan: we will obtain labs, including electrolytes, cardiac markers.   Labs without actionable derangements.  Electrolytes within normal limits.  Discussed with cardiology, given he is otherwise hemodynamically stable he does not need emergent transvenous pacemaker placement, but will likely require permanent pacemaker  placement.  We will plan for admission for further monitoring, cardiology consultation.    Patient initially requested transfer to [facility] for further care. Discussed with [facility], however they are full and have no bed availability. Discussed this with patient, who is agreeable w/ admission here.   Discussed with hospitalist for admission.   Final Clinical Impression(s) / ED Diagnosis  Final diagnoses:  Bradycardia  SOB (shortness of breath)  DOE (dyspnea on exertion)  Heart block AV complete ([place])     Note:  This document was prepared using Dragon voice recognition software and may include unintentional dictation errors.     Lilia Pro., MD 08/10/19 [phone]

## 2019-08-09 NOTE — ED Notes (Signed)
Per ICU, wait to bring pt to floor.

## 2019-08-09 NOTE — ED Notes (Signed)
Pt now agreeable to be admitted.

## 2019-08-09 NOTE — Progress Notes (Signed)
   08/09/19 1800  Clinical Encounter Type  Visited With Patient not available  Visit Type Initial  Referral From Nurse  Consult/Referral To Anita was page by nurse to talk with patient concerning living will. Patient was not available doctor was with patient. Follow up needed.

## 2019-08-09 NOTE — H&P (Signed)
[facility] - [place] [facility]   PATIENT NAME: Andres Torres    MR#:  [id number]  DATE OF BIRTH:  01/12/1951  DATE OF ADMISSION:  08/09/2019  PRIMARY CARE PHYSICIAN: Sandrea Hughsubio, Jessica, NP   REQUESTING/REFERRING PHYSICIAN:Monk, Maralyn SagoSarah  CHIEF COMPLAINT:   Chief Complaint  Patient presents with  . Bradycardia  Shortness of breath  HISTORY OF PRESENT ILLNESS:  Andres Johnsdward Mccown  is a 68 y.o. male with a known history of arthritis and prediabetes mellitus who presented to the emergency room from urgent care after patient was found to have second-degree heart block type II at the clinic.  Patient apparently has been having worsening shortness of breath over the last 2 weeks.  No chest pain.  No fevers.  No nausea vomiting.  Reported dyspnea on exertion.  Was seen at the outpatient clinic and subsequently sent to the emergency room due to heart rate being in the 30s with second-degree type II AV block.  Patient was found to the emergency room to be in complete heart block with heart rate in the 30s to 40s.  Patient is not on any AV nodal blocking agents.  Patient [facility] but no beds available.  Patient now agreeable to being admitted to this facility.  He also initially refused COVID testing but after discussion with patient he agreed to have COVID testing done prior to admission today in case he requires pacemaker placement.  Emergency room provider already discussed case with cardiologist.  Medical service called to admit patient for further evaluation and management.  PAST MEDICAL HISTORY:   Past Medical History:  Diagnosis Date  . Diabetes mellitus without complication (HCC)     PAST SURGICAL HISTORY:  No past surgical history on file.  SOCIAL HISTORY:   Social History   Tobacco Use  . Smoking status: Former Smoker    Quit date: 03/24/2018    Years since quitting: 1.3  . Smokeless tobacco: Never Used  Substance Use Topics  .  Alcohol use: Not Currently    FAMILY HISTORY:   Family History  Problem Relation Age of Onset  . Arthritis Mother   . Diabetes Father   . Kidney disease Father     DRUG ALLERGIES:  No Known Allergies  REVIEW OF SYSTEMS:   Review of Systems  Constitutional: Negative for chills and fever.  HENT: Negative for hearing loss and tinnitus.   Eyes: Negative for blurred vision and double vision.  Respiratory: Positive for shortness of breath. Negative for cough and wheezing.   Cardiovascular: Negative for chest pain and palpitations.  Gastrointestinal: Negative for heartburn and nausea.  Genitourinary: Negative for dysuria and urgency.  Musculoskeletal: Negative for myalgias and neck pain.  Skin: Negative for itching and rash.  Neurological: Negative for dizziness and headaches.  Psychiatric/Behavioral: Negative for depression and hallucinations.    MEDICATIONS AT HOME:   Prior to Admission medications   Medication Sig Start Date End Date Taking? Authorizing Provider  ibuprofen (ADVIL) 200 MG tablet Take 200 mg by mouth every 6 (six) hours as needed.   Yes [provider]  Multiple Vitamin (MULTIVITAMIN WITH MINERALS) TABS tablet Take 1 tablet by mouth daily.   Yes [provider]  VENTOLIN HFA 108 (90 Base) MCG/ACT inhaler Inhale 2 puffs into the lungs every 6 (six) hours as needed.  03/22/18  Yes [provider]  vitamin C (ASCORBIC ACID) 250 MG tablet Take 250 mg by mouth daily.   Yes  [provider]      VITAL SIGNS:  Blood pressure (!) 158/84, pulse (!) 38, resp. rate (!) 21, height 5\' 11"  (1.803 m), weight 83.9 kg, SpO2 99 %.  PHYSICAL EXAMINATION:  Physical Exam  GENERAL:  68 y.o.-year-old patient lying in the bed with no acute distress.  EYES: Pupils equal, round, reactive to light and accommodation. No scleral icterus. Extraocular muscles intact.  HEENT: Head atraumatic, normocephalic. Oropharynx and nasopharynx clear.  NECK:   Supple, no jugular venous distention. No thyroid enlargement, no tenderness.  LUNGS: Normal breath sounds bilaterally, no wheezing, rales,rhonchi or crepitation. No use of accessory muscles of respiration.  CARDIOVASCULAR: S1, S2 , bradycardic with heart rate occasionally down to the 40s and then back to the 70s.  No murmurs, rubs, or gallops.  ABDOMEN: Soft, nontender, nondistended. Bowel sounds present. No organomegaly or mass.  EXTREMITIES: No pedal edema, cyanosis, or clubbing.  NEUROLOGIC: Cranial nerves II through XII are intact. Muscle strength 5/5 in all extremities. Sensation intact. Gait not checked.  PSYCHIATRIC: The patient is alert and oriented x 3.  SKIN: No obvious rash, lesion, or ulcer.   LABORATORY PANEL:   CBC Recent Labs  Lab 08/09/19 1229  WBC 8.7  HGB 14.6  HCT 43.6  PLT 213   ------------------------------------------------------------------------------------------------------------------  Chemistries  Recent Labs  Lab 08/09/19 1229  NA 140  K 4.1  CL 105  CO2 25  GLUCOSE 172*  BUN 13  CREATININE 1.21  CALCIUM 9.4  MG 2.2  AST 31  ALT 43  ALKPHOS 56  BILITOT 1.5*   ------------------------------------------------------------------------------------------------------------------  Cardiac Enzymes No results for input(s): TROPONINI in the last 168 hours. ------------------------------------------------------------------------------------------------------------------  RADIOLOGY:  Dg Chest 1 View  Result Date: 08/09/2019 CLINICAL DATA:  Shortness of breath. EXAM: CHEST  1 VIEW COMPARISON:  May 07, 2017 FINDINGS: The cardiomediastinal silhouette is stable. No pneumothorax. No nodules, masses, or focal infiltrates. No overt edema. IMPRESSION: No acute abnormalities identified. Electronically Signed   By: Dorise Bullion III M.D   On: 08/09/2019 13:45      IMPRESSION AND PLAN:  Patient is a 68 year old African-American male with history of  prediabetes mellitus and arthritis being admitted for further evaluation and management of complete heart block.  1.  Complete heart block. Heart rate ranging from upper 30s to 70s intermittently. Denies any chest pain at this time.  Initial presenting symptom was shortness of breath. Plan is to avoid all AV nodal blocking agents such as beta blockers or calcium channel blockers. Cardiology consult already placed and I have notified Dr. Nehemiah Massed.  If heart block persists patient would likely require permanent pacemaker placement.  At this time no indication for emergency temporary transvenous pacemaker placement by cardiology by STEMI team; Dr. Saunders Revel 2D echocardiogram to evaluate cardiac function  2.  Hypertensive urgency Noted elevated blood pressures. Placed on p.o. hydralazine 50 mg 3 times daily.  Placed on IV hydralazine as needed for systolic blood pressure greater than 160. Avoiding all AV nodal blocking agents at this time.  3.  History of prediabetes per patient Diet controlled for now.  DVT prophylaxis; placed on SCDs Holding off on heparin products in case patient requires being taken to the OR for pacemaker placement  4.  Tobacco abuse.   Patient smokes less than half a pack of cigarettes per day. Smoking cessation counseling done.  Patient claims that he has quit effective from today. Placed on nicotine patch  All the records are reviewed and case discussed  with ED provider. Management plans discussed with the patient, family and they are in agreement.  Updated patient's daughter present at bedside on treatment plans.  CODE STATUS: Full code  TOTAL TIME TAKING CARE OF THIS PATIENT: 60 minutes.    Hilma Steinhilber M.D on 08/09/2019 at 3:52 PM  Between 7am to 6pm - Pager - [phone]  After 6pm go to [email or web address] - Social research officer, government  [facility] [place] [facility]  Office  [phone]  CC: Primary care physician; Sandrea Hughs, NP   Note: This dictation was  prepared with Dragon dictation along with smaller phrase technology. Any transcriptional errors that result from this process are unintentional.

## 2019-08-09 NOTE — ED Triage Notes (Signed)
Pt to ED via POV from [facility]. Pt was seen for Sh[facility]. Pt states that he has had shortness of breath with exertion for the past 2 weeks but over the past 2 days it has gotten worse and he noticing that he is short of breath all the time. Pt reports also having pain in his legs while he is walking up steps. Pt was found to be bradycardic in the 30's at [facility]. Pt is A & O in NAD. Pt reports some lightheadedness and dizziness.

## 2019-08-09 NOTE — ED Notes (Signed)
Pt refusing COVID test  

## 2019-08-09 NOTE — ED Notes (Signed)
ED Provider at bedside. 

## 2019-08-09 NOTE — ED Notes (Signed)
Pt requesting to be transferred. States wants to talk to family and have a second MD opinion. Dr. Joan Mayans aware.

## 2019-08-09 NOTE — Consult Note (Signed)
CRITICAL CARE NOTE      CHIEF COMPLAINT:   Arrhythmia with second and third-degree heart block   History of present illness   [name] is a pleasant 68 year old male with a history of lifelong smoking, reports significant dyspnea and possible emphysema, also background of diabetes and left hip arthritis.  Patient came into the emergency department due to finding of second-degree heart block at the clinic.  He states she has not had cardiac issues in the past has never seen a cardiologist before has never had chest pain or discomfort but does have significant dyspnea most prominent upon climbing stairs.  In the ER he was found to have asymptomatic bradycardia arrhythmia with second-degree type II AV block as well as complete heart block.  Patient is COVID negative.  Critical care consultation was placed for upgrade in care as well as cardiac evaluation with possible need for pacemaker and close monitoring.  He requested tramadol but due to left hip pain.  PAST MEDICAL HISTORY   Past Medical History:  Diagnosis Date  . Diabetes mellitus without complication (HCC)      SURGICAL HISTORY   No past surgical history on file.   FAMILY HISTORY   Family History  Problem Relation Age of Onset  . Arthritis Mother   . Diabetes Father   . Kidney disease Father      SOCIAL HISTORY   Social History   Tobacco Use  . Smoking status: Former Smoker    Quit date: 03/24/2018    Years since quitting: 1.3  . Smokeless tobacco: Never Used  Substance Use Topics  . Alcohol use: Not Currently  . Drug use: Not Currently    Comment: 50 years ago     MEDICATIONS   Current Medication:  Current Facility-Administered Medications:  .  albuterol (PROVENTIL) (2.5 MG/3ML) 0.083% nebulizer solution 2.5 mg, 2.5 mg,  Nebulization, Q6H PRN, Bertram Savin, RPH .  Chlorhexidine Gluconate Cloth 2 % PADS 6 each, 6 each, Topical, Daily, Vida Rigger, MD, 6 each at 08/09/19 1658 .  hydrALAZINE (APRESOLINE) injection 10 mg, 10 mg, Intravenous, Q6H PRN, Ojie, Jude, MD .  hydrALAZINE (APRESOLINE) tablet 50 mg, 50 mg, Oral, Q8H, Ojie, Jude, MD, 50 mg at 08/09/19 1734 .  multivitamin with minerals tablet 1 tablet, 1 tablet, Oral, Daily, Ojie, Jude, MD, 1 tablet at 08/09/19 1734 .  nicotine (NICODERM CQ - dosed in mg/24 hours) patch 14 mg, 14 mg, Transdermal, Daily, Ojie, Jude, MD, 14 mg at 08/09/19 1734 .  traMADol (ULTRAM) tablet 50 mg, 50 mg, Oral, Q6H PRN, Vida Rigger, MD, 50 mg at 08/09/19 1734 .  [START ON 08/10/2019] vitamin C (ASCORBIC ACID) tablet 250 mg, 250 mg, Oral, Daily, Ojie, Jude, MD    ALLERGIES   Patient has no known allergies.    REVIEW OF SYSTEMS     10 point ROS conducted and is negative except as per subjective findings  PHYSICAL EXAMINATION   Vitals:   08/09/19 1700 08/09/19 1800  BP: (!) 197/68 137/84  Pulse:  (!) 37  Resp: 15 12  Temp:    SpO2:  97%    GENERAL: No apparent distress HEAD: Normocephalic, atraumatic.  EYES: Pupils equal, round, reactive to light.  No scleral icterus.  MOUTH: Moist mucosal membrane. NECK: Supple. No thyromegaly. No nodules. No JVD.  PULMONARY: Bibasilar crackles CARDIOVASCULAR: S1 and S2. Regular rate and rhythm. No murmurs, rubs, or gallops.  GASTROINTESTINAL: Soft, nontender, non-distended. No masses. Positive bowel sounds. No  hepatosplenomegaly.  MUSCULOSKELETAL: No swelling, clubbing, or edema.  NEUROLOGIC: Mild distress due to acute illness SKIN:intact,warm,dry   PERTINENT DATA        Infusions:  Scheduled Medications: . Chlorhexidine Gluconate Cloth  6 each Topical Daily  . hydrALAZINE  50 mg Oral Q8H  . multivitamin with minerals  1 tablet Oral Daily  . nicotine  14 mg Transdermal Daily  . [START ON 08/10/2019]  vitamin C  250 mg Oral Daily   PRN Medications: albuterol, hydrALAZINE, traMADol Hemodynamic parameters:   Intake/Output: No intake/output data recorded.  Ventilator  Settings:     LAB RESULTS:  Basic Metabolic Panel: Recent Labs  Lab 08/09/19 1229  NA 140  K 4.1  CL 105  CO2 25  GLUCOSE 172*  BUN 13  CREATININE 1.21  CALCIUM 9.4  MG 2.2  PHOS 3.3   Liver Function Tests: Recent Labs  Lab 08/09/19 1229  AST 31  ALT 43  ALKPHOS 56  BILITOT 1.5*  PROT 7.9  ALBUMIN 4.3   No results for input(s): LIPASE, AMYLASE in the last 168 hours. No results for input(s): AMMONIA in the last 168 hours. CBC: Recent Labs  Lab 08/09/19 1229  WBC 8.7  NEUTROABS 5.3  HGB 14.6  HCT 43.6  MCV 94.8  PLT 213   Cardiac Enzymes: No results for input(s): CKTOTAL, CKMB, CKMBINDEX, TROPONINI in the last 168 hours. BNP: Invalid input(s): POCBNP CBG: Recent Labs  Lab 08/09/19 1659  GLUCAP 220*     IMAGING RESULTS:  Imaging: Dg Chest 1 View  Result Date: 08/09/2019 CLINICAL DATA:  Shortness of breath. EXAM: CHEST  1 VIEW COMPARISON:  May 07, 2017 FINDINGS: The cardiomediastinal silhouette is stable. No pneumothorax. No nodules, masses, or focal infiltrates. No overt edema. IMPRESSION: No acute abnormalities identified. Electronically Signed   By: Gerome Samavid  Williams III M.D   On: 08/09/2019 13:45      ASSESSMENT AND PLAN      Symptomatic bradyarrhythmia    -Patient now states symptoms have improved denies chest pain    -Requesting tramadol for some pain mostly in left hip    -Second and third-degree AV block     -Cardiology consultation placed by hospitalist service-appreciate input  -oxygen as needed ICU monitoring  Centrilobular emphysema -Patient has crackles at the bases bilaterally -No lower extremity edema, patient denies previous cardiac disease-BNP is mildly elevated over 600 -Lifelong smoking status- smoking cessation counseling -We will place on duo nebs  for now -Lasix 20 mg x 1   GI/Nutrition GI PROPHYLAXIS as indicated DIET-->TF's as tolerated Constipation protocol as indicated  ENDO - ICU hypoglycemic\Hyperglycemia protocol -check FSBS per protocol   ELECTROLYTES -follow labs as needed -replace as needed -pharmacy consultation   DVT/GI PRX ordered -SCDs  TRANSFUSIONS AS NEEDED MONITOR FSBS ASSESS the need for LABS as needed   Critical care provider statement:    Critical care time (minutes):  32   Critical care time was exclusive of:  Separately billable procedures and treating other patients   Critical care was necessary to treat or prevent imminent or life-threatening deterioration of the following conditions:   Symptomatic bradycardia arrhythmia, secondary to third-degree AV block, left hip pain, diabetes, emphysema, multiple comorbid conditions   Critical care was time spent personally by me on the following activities:  Development of treatment plan with patient or surrogate, discussions with consultants, evaluation of patient's response to treatment, examination of patient, obtaining history from patient or surrogate, ordering and performing treatments and interventions,  ordering and review of laboratory studies and re-evaluation of patient's condition.  I assumed direction of critical care for this patient from another provider in my specialty: no    This document was prepared using Dragon voice recognition software and may include unintentional dictation errors.    Ottie Glazier, M.D.  Division of [place]

## 2019-08-09 NOTE — Significant Event (Signed)
[facility]  Date: 08/09/19 Time: 12:48 PM  I was contacted by Dr. Joan Mayans regarding Mr. Kissling, who presented with exertional dyspnea and was found to be bradycardic.  EKG personally reviewed and shows NSR with 3rd degree heart block and junctional escape with RBBB and LAFB.  Ventricular rate is 43 bpm.  BP is 221/65.  Per Dr. Joan Mayans, Mr. Wiederholt is asymptomatic at rest.  I recommend evaluation for reversible causes of heart block and consultation with general cardiology on-call.  If heart block persists, the patient will likely need a permanent pacemaker.  All AV-nodal blocking agents should be avoided, including beta-blockers and calcium channel blockers.  There is no indication for emergent temporary transvenous pacing wire at this time.  Nelva Bush, MD [facility] Pager: [phone]

## 2019-08-09 NOTE — Progress Notes (Signed)
Care Alignment Note  Advanced Directives Documents (Living Will, Power of Attorney) currently in the EHR no advanced directives documents available .  Has the patient discussed their wishes with their family/healthcare power of attorney yes. How much does the family or healthcare power of attorney know about their wishes. Patient's daughter at bedside understand patient has complete heart block.  She agrees with patient's decision to be DNR  What does the patient/decision maker understand about their medical condition and the natural course of their disease.  Complete heart block.  Hypertensive urgency.  Tobacco abuse.  Arthritis  What is the patient/decision maker's biggest fear or concern for the future becoming a burden to my family  What is the most important goal for this patient should their health condition worsen maintenance of function.  Current   Code Status: Full Code  Current code status has been reviewed/updated.  Time spent:17 minutes

## 2019-08-10 ENCOUNTER — Inpatient Hospital Stay (HOSPITAL_COMMUNITY)
Admit: 2019-08-10 | Discharge: 2019-08-10 | Disposition: A | Discharge status: Home / Self Care | Payer: Medicare Other | Attending: Internal Medicine | Admitting: Internal Medicine

## 2019-08-10 DIAGNOSIS — I160 Hypertensive urgency: Secondary | ICD-10-CM | POA: Diagnosis not present

## 2019-08-10 DIAGNOSIS — I442 Atrioventricular block, complete: Secondary | ICD-10-CM | POA: Diagnosis not present

## 2019-08-10 DIAGNOSIS — M25511 Pain in right shoulder: Secondary | ICD-10-CM | POA: Diagnosis not present

## 2019-08-10 DIAGNOSIS — M1612 Unilateral primary osteoarthritis, left hip: Secondary | ICD-10-CM | POA: Diagnosis not present

## 2019-08-10 DIAGNOSIS — M1990 Unspecified osteoarthritis, unspecified site: Secondary | ICD-10-CM | POA: Diagnosis not present

## 2019-08-10 DIAGNOSIS — Z841 Family history of disorders of kidney and ureter: Secondary | ICD-10-CM | POA: Diagnosis not present

## 2019-08-10 DIAGNOSIS — Z833 Family history of diabetes mellitus: Secondary | ICD-10-CM | POA: Diagnosis not present

## 2019-08-10 DIAGNOSIS — J432 Centrilobular emphysema: Secondary | ICD-10-CM | POA: Diagnosis not present

## 2019-08-10 DIAGNOSIS — R0609 Other forms of dyspnea: Secondary | ICD-10-CM

## 2019-08-10 DIAGNOSIS — Z791 Long term (current) use of non-steroidal anti-inflammatories (NSAID): Secondary | ICD-10-CM | POA: Diagnosis not present

## 2019-08-10 DIAGNOSIS — Z716 Tobacco abuse counseling: Secondary | ICD-10-CM | POA: Diagnosis not present

## 2019-08-10 DIAGNOSIS — Z20828 Contact with and (suspected) exposure to other viral communicable diseases: Secondary | ICD-10-CM | POA: Diagnosis not present

## 2019-08-10 DIAGNOSIS — I10 Essential (primary) hypertension: Secondary | ICD-10-CM | POA: Diagnosis not present

## 2019-08-10 DIAGNOSIS — Z66 Do not resuscitate: Secondary | ICD-10-CM | POA: Diagnosis not present

## 2019-08-10 DIAGNOSIS — Z5329 Procedure and treatment not carried out because of patient's decision for other reasons: Secondary | ICD-10-CM | POA: Diagnosis not present

## 2019-08-10 DIAGNOSIS — F17210 Nicotine dependence, cigarettes, uncomplicated: Secondary | ICD-10-CM | POA: Diagnosis not present

## 2019-08-10 DIAGNOSIS — I340 Nonrheumatic mitral (valve) insufficiency: Secondary | ICD-10-CM

## 2019-08-10 DIAGNOSIS — M5136 Other intervertebral disc degeneration, lumbar region: Secondary | ICD-10-CM | POA: Diagnosis not present

## 2019-08-10 DIAGNOSIS — R7303 Prediabetes: Secondary | ICD-10-CM | POA: Diagnosis not present

## 2019-08-10 DIAGNOSIS — R001 Bradycardia, unspecified: Secondary | ICD-10-CM | POA: Diagnosis present

## 2019-08-10 DIAGNOSIS — I498 Other specified cardiac arrhythmias: Secondary | ICD-10-CM | POA: Diagnosis not present

## 2019-08-10 DIAGNOSIS — Z79899 Other long term (current) drug therapy: Secondary | ICD-10-CM | POA: Diagnosis not present

## 2019-08-10 DIAGNOSIS — G894 Chronic pain syndrome: Secondary | ICD-10-CM | POA: Diagnosis not present

## 2019-08-10 DIAGNOSIS — Z8261 Family history of arthritis: Secondary | ICD-10-CM | POA: Diagnosis not present

## 2019-08-10 LAB — CBC
HCT: 41.5 % (ref 39.0–52.0)
Hemoglobin: 13.9 g/dL (ref 13.0–17.0)
MCH: 31.5 pg (ref 26.0–34.0)
MCHC: 33.5 g/dL (ref 30.0–36.0)
MCV: 94.1 fL (ref 80.0–100.0)
Platelets: 208 10*3/uL (ref 150–400)
RBC: 4.41 MIL/uL (ref 4.22–5.81)
RDW: 13.6 % (ref 11.5–15.5)
WBC: 11.5 10*3/uL — ABNORMAL HIGH (ref 4.0–10.5)
nRBC: 0 % (ref 0.0–0.2)

## 2019-08-10 LAB — BASIC METABOLIC PANEL
Anion gap: 10 (ref 5–15)
BUN: 19 mg/dL (ref 8–23)
CO2: 23 mmol/L (ref 22–32)
Calcium: 9.1 mg/dL (ref 8.9–10.3)
Chloride: 108 mmol/L (ref 98–111)
Creatinine, Ser: 1.29 mg/dL — ABNORMAL HIGH (ref 0.61–1.24)
GFR calc Af Amer: >60 mL/min (ref >60)
GFR calc non Af Amer: 56 mL/min — ABNORMAL LOW (ref >60)
Glucose, Bld: 133 mg/dL — ABNORMAL HIGH (ref 70–99)
Potassium: 4 mmol/L (ref 3.5–5.1)
Sodium: 141 mmol/L (ref 135–145)

## 2019-08-10 LAB — ECHOCARDIOGRAM COMPLETE
Height: 71 in
Weight: 2892.44 oz

## 2019-08-10 LAB — PHOSPHORUS: Phosphorus: 3.8 mg/dL (ref 2.5–4.6)

## 2019-08-10 LAB — MAGNESIUM: Magnesium: 2.2 mg/dL (ref 1.7–2.4)

## 2019-08-10 MED ORDER — CEFAZOLIN SODIUM-DEXTROSE 2-4 GM/100ML-% IV SOLN
2.0000 g | INTRAVENOUS | Status: DC
  Filled 2019-08-10: qty 100

## 2019-08-10 MED ORDER — SODIUM CHLORIDE 0.9 % IV SOLN: INTRAVENOUS | Status: DC

## 2019-08-10 MED ORDER — SODIUM CHLORIDE 0.9 % IV SOLN
80.0000 mg | INTRAVENOUS | Status: DC
  Filled 2019-08-10: qty 2

## 2019-08-10 NOTE — Consult Note (Signed)
[facility] Cardiology Consultation Note  Patient ID: Andres Torres, MRN: [id number], DOB/AGE: 01/06/1951 68 y.o. Admit date: 08/09/2019   Date of Consult: 08/10/2019 Primary Physician: Sandrea Hughs, NP Primary Cardiologist: None  Chief Complaint:  Chief Complaint  Patient presents with  . Bradycardia   Reason for Consult: Heart block  HPI: 68 y.o. male with no apparent significant previous cardiovascular history who has done fairly well from a physical standpoint but has had significant progression of shortness of breath with physical activity especially up the steps or and long distances.  This has been significantly worse over the last several months.  The patient was seen in an outside office for other issues and found to have a slow heart rate.  Despite this slow heart rate the patient has not had any previous significant syncope dizziness nausea diaphoresis.  Upon arrival to the emergency room the patient is asymptomatic and sitting up straight.  In addition overnight the patient has not had any significant symptoms.  EKG upon arrival shows's third-degree AV block with left axis deviation and right bundle branch block.  He has had reasonable ventricular rhythm and some conduction at times which also includes the possibility second-degree type II AV block with slow ventricular rate.  With his symptoms listed above and continued issues with slow heart rate throughout the evening the patient will need further pacemaker placement to reduce the possibility of further symptoms and heart block Echocardiogram showing normal LV systolic function with ejection fraction of 60% and no evidence of significant valvular heart disease Past Medical History:  Diagnosis Date  . Diabetes mellitus without complication [facility])       Surgical History: History reviewed. No pertinent surgical history.   Home Meds: Prior to Admission medications   Medication Sig Start Date End Date Taking? Authorizing  Provider  ibuprofen (ADVIL) 200 MG tablet Take 200 mg by mouth every 6 (six) hours as needed.   Yes [provider]  Multiple Vitamin (MULTIVITAMIN WITH MINERALS) TABS tablet Take 1 tablet by mouth daily.   Yes [provider]  VENTOLIN HFA 108 (90 Base) MCG/ACT inhaler Inhale 2 puffs into the lungs every 6 (six) hours as needed.  03/22/18  Yes [provider]  vitamin C (ASCORBIC ACID) 250 MG tablet Take 250 mg by mouth daily.   Yes [provider]    Inpatient Medications:  . Chlorhexidine Gluconate Cloth  6 each Topical Daily  . hydrALAZINE  50 mg Oral Q8H  . Melatonin  5 mg Oral QHS  . multivitamin with minerals  1 tablet Oral Daily  . nicotine  14 mg Transdermal Daily  . vitamin C  250 mg Oral Daily     Allergies: No Known Allergies  Social History   Socioeconomic History  . Marital status: Single    Spouse name: Not on file  . Number of children: Not on file  . Years of education: Not on file  . Highest education level: Not on file  Occupational History  . Not on file  Social Needs  . Financial resource strain: Not on file  . Food insecurity    Worry: Not on file    Inability: Not on file  . Transportation needs    Medical: Not on file    Non-medical: Not on file  Tobacco Use  . Smoking status: Former Smoker    Quit date: 03/24/2018    Years since quitting: 1.3  . Smokeless tobacco: Never Used  Substance and Sexual  Activity  . Alcohol use: Not Currently  . Drug use: Not Currently    Comment: 50 years ago  . Sexual activity: Not on file  Lifestyle  . Physical activity    Days per week: Not on file    Minutes per session: Not on file  . Stress: Not on file  Relationships  . Social Herbalist on phone: Not on file    Gets together: Not on file    Attends religious service: Not on file    Active member of club or organization: Not on file    Attends meetings of clubs or organizations: Not on file    Relationship  status: Not on file  . Intimate partner violence    Fear of current or ex partner: Not on file    Emotionally abused: Not on file    Physically abused: Not on file    Forced sexual activity: Not on file  Other Topics Concern  . Not on file  Social History Narrative  . Not on file     Family History  Problem Relation Age of Onset  . Arthritis Mother   . Diabetes Father   . Kidney disease Father      Review of Systems Positive for shortness of breath Negative for: General:  chills, fever, night sweats or weight changes.  Cardiovascular: PND orthopnea syncope dizziness  Dermatological skin lesions rashes Respiratory: Cough congestion Urologic: Frequent urination urination at night and hematuria Abdominal: negative for nausea, vomiting, diarrhea, bright red blood per rectum, melena, or hematemesis Neurologic: negative for visual changes, and/or hearing changes  All other systems reviewed and are otherwise negative except as noted above.  Labs: No results for input(s): CKTOTAL, CKMB, TROPONINI in the last 72 hours. Lab Results  Component Value Date   WBC 11.5 (H) 08/10/2019   HGB 13.9 08/10/2019   HCT 41.5 08/10/2019   MCV 94.1 08/10/2019   PLT 208 08/10/2019    Recent Labs  Lab 08/09/19 1229 08/10/19 0427  NA 140 141  K 4.1 4.0  CL 105 108  CO2 25 23  BUN 13 19  CREATININE 1.21 1.29*  CALCIUM 9.4 9.1  PROT 7.9  --   BILITOT 1.5*  --   ALKPHOS 56  --   ALT 43  --   AST 31  --   GLUCOSE 172* 133*   No results found for: CHOL, HDL, LDLCALC, TRIG No results found for: DDIMER  Radiology/Studies:  Dg Chest 1 View  Result Date: 08/09/2019 CLINICAL DATA:  Shortness of breath. EXAM: CHEST  1 VIEW COMPARISON:  May 07, 2017 FINDINGS: The cardiomediastinal silhouette is stable. No pneumothorax. No nodules, masses, or focal infiltrates. No overt edema. IMPRESSION: No acute abnormalities identified. Electronically Signed   By: Dorise Bullion III M.D   On: 08/09/2019  13:45    EKG: Normal sinus rhythm with high degree heart block and left axis deviation right with right bundle branch block  Weights: Filed Weights   08/09/19 1228 08/09/19 1658  Weight: 83.9 kg 82 kg     Physical Exam: Blood pressure (!) 134/56, pulse (!) 42, temperature 98.2 F (36.8 C), temperature source Oral, resp. rate 11, height 5\' 11"  (1.803 m), weight 82 kg, SpO2 97 %. Body mass index is 25.21 kg/m. General: Well developed, well nourished, in no acute distress. Head eyes ears nose throat: Normocephalic, atraumatic, sclera non-icteric, no xanthomas, nares are without discharge. No apparent thyromegaly and/or mass  Lungs: Normal  respiratory effort.  no wheezes, no rales, no rhonchi.  Heart: RRR with normal S1 S2. no murmur gallop, no rub, PMI is normal size and placement, carotid upstroke normal without bruit, jugular venous pressure is normal Abdomen: Soft, non-tender, non-distended with normoactive bowel sounds. No hepatomegaly. No rebound/guarding. No obvious abdominal masses. Abdominal aorta is normal size without bruit Extremities: No edema. no cyanosis, no clubbing, no ulcers  Peripheral : 2+ bilateral upper extremity pulses, 2+ bilateral femoral pulses, 2+ bilateral dorsal pedal pulse Neuro: Alert and oriented. No facial asymmetry. No focal deficit. Moves all extremities spontaneously. Musculoskeletal: Normal muscle tone without kyphosis Psych:  Responds to questions appropriately with a normal affect.    Assessment: 68 year old male with significant progression of shortness of breath and high degree heart block with abnormal EKG consistent with chronotropic incompetence and need for further evaluation and treatment options for heart block.  Patient has been instructed that AV dual-chamber pacemaker placement would help this situation and appropriate at this time.  He understands the risk and benefits of this pacemaker placement.  This includes the possibility of death  stroke heart attack infection bleeding blood clot hemopericardium pneumothorax and infection.  The patient is at low risk for conscious sedation  Plan: 1.  Proceed to dual-chamber pacemaker placement as per above 2.  No AV node blocking agents necessary 3.  Further treatment options after above  Signed, Lamar BlinksBruce J Shant Hence M.D. [facility] 08/10/2019, 3:40 PM

## 2019-08-10 NOTE — Progress Notes (Signed)
Pt arrived to room 252 from ICU.  Pt is scheduled for pacemaker insertion tomorrow.  When plan discussed with pt, he stated that he was unaware of going to the OR tomorrow. Pt was under the impression that his procedure was "not urgent", and has made family plans for tomorrow.  Pt educated in-depth regarding heart block and the importance of receiving the pacemaker.  Pt adamant that he cannot change his family plans, and that he will sign out AMA if necessary.  Offered to contact that cardiologist to discuss the timing of the procedure ie, tomorrow vs as outpatient.

## 2019-08-10 NOTE — Progress Notes (Addendum)
On change of shift report pt voiced out how he feel about not being informed about the pacemaker placement scheduled 08/11/2019 due to his complete heart block. Pt also added that the doctor did not informed the pt and pt daughter about the surgery. Pt was very upset and states that " communication is very important for him especially when it comes to his health". Page Dr. Nehemiah Massed. Will continue to monitor  Update 2156: Talked to Doctor Nehemiah Massed and states that he will be talking to the pt in the morning about the surgery. Dr. Nehemiah Massed also added that pt was being observed and watch overnight to evaluate pt condition. Will notify pt. Will continue to monitor.  Update 2206: Pt refused his hydralazine and melatonin tonight even with education. Notified prime. Will continue to monitor.

## 2019-08-10 NOTE — Consult Note (Addendum)
Cardiology Consultation:   Patient ID: Andres Torres  MRN: [id number]; DOB: 07/06/1951  Admit date: 08/09/2019 Date of Consult: 08/10/2019  Primary Care Provider: Sandrea Hughsubio, Jessica, NP Primary [facility]ologist:none on file[facility] Primary Electrophysiologist:  None    Patient Profile:   Andres Torres is a 68 y.o. male with a hx of diabetes, former smoker x30+ years who is being seen today for the evaluation of complete heart block at the request of Dr.  Jama FlavorsJude Ojie  History of Present Illness:   Andres Torres is a 68 year old gentleman, former smoker (quit 1 week ago), who presents to the hospital due to complete heart block.  Patient states having dyspnea on exertion which has worsened over the past 1 to 2 weeks.  He is usually able to go up a flight of stairs with no problems.  Over the last 2 weeks, he needs to catch his breath when he goes up a flight of stairs.  Due to the worsening symptoms, he presented to his primary care provider.  An ECG was obtained, patient was then advised to come to the ED.  He requested his daughter bring him to the ED instead of EMS.  Upon arrival, EKG in the emergency room revealed complete heart block.  Patient was stable, able to have a full conversation, denies any dizziness, presyncope or syncope.  Patient denies any history of heart disease, denies chest pain, palpitations, prior MIs.  He denies taking any medications apart from tramadol due to his hip pain and occasional albuterol prescribed about 4 years ago due to shortness of breath.  Denies taking any recreational drugs.  Heart Pathway Score:     Past Medical History:  Diagnosis Date  . Diabetes mellitus without complication (HCC)     History reviewed. No pertinent surgical history.   Home Medications:  Prior to Admission medications   Medication Sig Start Date End Date Taking? Authorizing Provider  ibuprofen (ADVIL) 200 MG tablet Take 200 mg by mouth every 6 (six) hours as needed.   Yes [provider]  Multiple Vitamin (MULTIVITAMIN WITH MINERALS) TABS tablet Take 1 tablet by mouth daily.   Yes [provider]  VENTOLIN HFA 108 (90 Base) MCG/ACT inhaler Inhale 2 puffs into the lungs every 6 (six) hours as needed.  03/22/18  Yes [provider]  vitamin C (ASCORBIC ACID) 250 MG tablet Take 250 mg by mouth daily.   Yes [provider]    Inpatient Medications: Scheduled Meds: . Chlorhexidine Gluconate Cloth  6 each Topical Daily  . hydrALAZINE  50 mg Oral Q8H  . Melatonin  5 mg Oral QHS  . multivitamin with minerals  1 tablet Oral Daily  . nicotine  14 mg Transdermal Daily  . vitamin C  250 mg Oral Daily   Continuous Infusions:  PRN Meds: albuterol, hydrALAZINE, HYDROcodone-acetaminophen, traMADol  Allergies:   No Known Allergies  Social History:   Social History   Socioeconomic History  . Marital status: Single    Spouse name: Not on file  . Number of children: Not on file  . Years of education: Not on file  . Highest education level: Not on file  Occupational History  . Not on file  Social Needs  . Financial resource strain: Not on file  . Food insecurity    Worry: Not on file    Inability: Not on file  . Transportation needs    Medical: Not on file    Non-medical: Not on file  Tobacco Use  . Smoking status: Former Smoker    Quit date: 03/24/2018    Years since quitting: 1.3  . Smokeless tobacco: Never Used  Substance and Sexual Activity  . Alcohol use: Not Currently  . Drug use: Not Currently    Comment: 50 years ago  . Sexual activity: Not on file  Lifestyle  . Physical activity    Days per week: Not on file    Minutes per session: Not on file  . Stress: Not on file  Relationships  . Social Musician on phone: Not on file    Gets together: Not on file    Attends religious service: Not on file    Active member of club or organization: Not on file    Attends meetings of clubs or organizations: Not on  file    Relationship status: Not on file  . Intimate partner violence    Fear of current or ex partner: Not on file    Emotionally abused: Not on file    Physically abused: Not on file    Forced sexual activity: Not on file  Other Topics Concern  . Not on file  Social History Narrative  . Not on file    Family History:    Family History  Problem Relation Age of Onset  . Arthritis Mother   . Diabetes Father   . Kidney disease Father      ROS:  Please see the history of present illness.   All other ROS reviewed and negative.     Physical Exam/Data:   Vitals:   08/10/19 0400 08/10/19 0500 08/10/19 0600 08/10/19 0800  BP: (!) 153/59 (!) 136/41 (!) 150/73 (!) 188/68  Pulse: (!) 37 (!) 37 (!) 42 (!) 111  Resp: 11 15 (!) 9 20  Temp:    98.3 F (36.8 C)  TempSrc:    Oral  SpO2: 94% 91% 92% 92%  Weight:      Height:        Intake/Output Summary (Last 24 hours) at 08/10/2019 0933 Last data filed at 08/09/2019 2100 Gross per 24 hour  Intake -  Output 375 ml  Net -375 ml   Last 3 Weights 08/09/2019 08/09/2019 08/06/2018  Weight (lbs) 180 lb 12.4 oz 185 lb 183 lb  Weight (kg) 82 kg 83.915 kg 83.008 kg     Body mass index is 25.21 kg/m.  General:  Well nourished, well developed, in no acute distress HEENT: normal Lymph: no adenopathy Neck: no JVD Endocrine:  No thryomegaly Vascular: No carotid bruits; FA pulses 2+ bilaterally without bruits  Cardiac:  normal S1, S2; bradycardic, regular,; no murmur  Lungs:  Clear anteriorly, decreased bs at bases. Abd: soft, nontender, no hepatomegaly  Ext: no edema Musculoskeletal:  No deformities, BUE and BLE strength normal and equal Skin: warm and dry  Neuro:  CNs 2-12 intact, no focal abnormalities noted Psych:  Normal affect   EKG:  The EKG was personally reviewed and demonstrates: Third-degree AV block.  Bradycardic heart rate 42 Telemetry:  Telemetry was personally reviewed and demonstrates: Third-degree AV block, heart rate  53  Relevant CV Studies: Echo was ordered  Laboratory Data:  High Sensitivity Troponin:   Recent Labs  Lab 08/09/19 1229 08/09/19 1502  TROPONINIHS 11 13     Chemistry Recent Labs  Lab 08/09/19 1229 08/10/19 0427  NA 140 141  K 4.1 4.0  CL 105 108  CO2 25 23  GLUCOSE 172* 133*  BUN 13 19  CREATININE 1.21 1.29*  CALCIUM 9.4 9.1  GFRNONAA >60 56*  GFRAA >60 >60  ANIONGAP 10 10    Recent Labs  Lab 08/09/19 1229  PROT 7.9  ALBUMIN 4.3  AST 31  ALT 43  ALKPHOS 56  BILITOT 1.5*   Hematology Recent Labs  Lab 08/09/19 1229 08/10/19 0427  WBC 8.7 11.5*  RBC 4.60 4.41  HGB 14.6 13.9  HCT 43.6 41.5  MCV 94.8 94.1  MCH 31.7 31.5  MCHC 33.5 33.5  RDW 13.7 13.6  PLT 213 208   BNP Recent Labs  Lab 08/09/19 1229  BNP 615.0*    DDimer No results for input(s): DDIMER in the last 168 hours.   Radiology/Studies:  Dg Chest 1 View  Result Date: 08/09/2019 CLINICAL DATA:  Shortness of breath. EXAM: CHEST  1 VIEW COMPARISON:  May 07, 2017 FINDINGS: The cardiomediastinal silhouette is stable. No pneumothorax. No nodules, masses, or focal infiltrates. No overt edema. IMPRESSION: No acute abnormalities identified. Electronically Signed   By: Dorise Bullion III M.D   On: 08/09/2019 13:45    Assessment and Plan:   1. Third-degree AV block -Patient is clinically asymptomatic with heart rates 40s to 50s. -Avoid any AV nodal blocking agents -Start isoproterenol drip for heart rates less than 40 and/or if patient becomes symptomatic -We will evaluate echo -If continues in complete heart block, he will need a pacemaker  2.  Hypertension -Continue as currently being managed with hydralazine p.o. and PRN IV for blood pressure over 627 systolic  3.  Dyspnea on exertion -We will evaluate echo -Lung disease is also possible due to long history of smoking -Continue PTA albuterol as needed  4.  Arthritis -Manage pain with PTA tramadol -Pain can be contributing to  elevated blood pressures  Total encounter time more than 80 minutes  Greater than 50% was spent in counseling and coordination of care with the patient  For questions or updates, please contact [place] Please consult [email or web address] for contact info under     Signed, Kate Sable, MD  08/10/2019 9:33 AM

## 2019-08-10 NOTE — Progress Notes (Signed)
   08/10/19 1505  Clinical Encounter Type  Visited With Patient  Visit Type Follow-up  Referral From Chaplain  Consult/Referral To [place] (Comment)  Patient was meeting with physician upon arrival.Daughter was present. Julian completed initial spiritual care screening after physician left. Patient was talkative and positive. [place] completed AD education. Shared that AD may not be completed while in hospital due to lack of notary public and witnesses. Encouraged patient and daughter that it could be completed at local bank. Patient and daughter Jerl Mina for visit. No further visits needed at this time.

## 2019-08-10 NOTE — Progress Notes (Signed)
CRITICAL CARE NOTE      CHIEF COMPLAINT:   Arrhythmia with second and third-degree heart block   Subjective    Patient profile: 58 M came in with 3rd AVB found in clinic, complained of dyspnea sent for eval to ER brought to MICU for monitoring and poss PPM.  Overnight:  HR 30s while asleep only.     Events:  9/27 - cardio eval - Isuprel for HR<40 poss PPM insertion.     PAST MEDICAL HISTORY   Past Medical History:  Diagnosis Date  . Diabetes mellitus without complication ([place])      SURGICAL HISTORY   History reviewed. No pertinent surgical history.   FAMILY HISTORY   Family History  Problem Relation Age of Onset  . Arthritis Mother   . Diabetes Father   . Kidney disease Father      SOCIAL HISTORY   Social History   Tobacco Use  . Smoking status: Former Smoker    Quit date: 03/24/2018    Years since quitting: 1.3  . Smokeless tobacco: Never Used  Substance Use Topics  . Alcohol use: Not Currently  . Drug use: Not Currently    Comment: 50 years ago     MEDICATIONS   Current Medication:  Current Facility-Administered Medications:  .  albuterol (PROVENTIL) (2.5 MG/3ML) 0.083% nebulizer solution 2.5 mg, 2.5 mg, Nebulization, Q6H PRN, Charlett Nose, RPH .  Chlorhexidine Gluconate Cloth 2 % PADS 6 each, 6 each, Topical, Daily, Ottie Glazier, MD, 6 each at 08/09/19 1658 .  hydrALAZINE (APRESOLINE) injection 10 mg, 10 mg, Intravenous, Q6H PRN, Ojie, Jude, MD, 10 mg at 08/10/19 0937 .  hydrALAZINE (APRESOLINE) tablet 50 mg, 50 mg, Oral, Q8H, Ojie, Jude, MD, 50 mg at 08/09/19 1734 .  HYDROcodone-acetaminophen (NORCO/VICODIN) 5-325 MG per tablet 1-2 tablet, 1-2 tablet, Oral, Q4H PRN, Tukov-Yual, Magdalene S, NP, 2 tablet at 08/09/19 1943 .  Melatonin TABS 5 mg, 5 mg, Oral, QHS,  Ojie, Jude, MD, 5 mg at 08/10/19 0002 .  multivitamin with minerals tablet 1 tablet, 1 tablet, Oral, Daily, Ojie, Jude, MD, 1 tablet at 08/10/19 0937 .  nicotine (NICODERM CQ - dosed in mg/24 hours) patch 14 mg, 14 mg, Transdermal, Daily, Ojie, Jude, MD, 14 mg at 08/10/19 0937 .  traMADol (ULTRAM) tablet 50 mg, 50 mg, Oral, Q6H PRN, Ottie Glazier, MD, 50 mg at 08/10/19 0906 .  vitamin C (ASCORBIC ACID) tablet 250 mg, 250 mg, Oral, Daily, Ojie, Jude, MD, 250 mg at 08/10/19 7425    ALLERGIES   Patient has no known allergies.    REVIEW OF SYSTEMS     10 point ROS conducted and is negative except as per subjective findings  PHYSICAL EXAMINATION   Vitals:   08/10/19 0900 08/10/19 1000  BP: (!) 193/69 (!) 150/56  Pulse: (!) 41 (!) 41  Resp: 12 13  Temp:    SpO2: 94% 97%    GENERAL: No apparent distress HEAD: Normocephalic, atraumatic.  EYES: Pupils equal, round, reactive to light.  No scleral icterus.  MOUTH: Moist mucosal membrane. NECK: Supple. No thyromegaly. No nodules. No JVD.  PULMONARY: Bibasilar crackles CARDIOVASCULAR: S1 and S2. Regular rate and rhythm. No murmurs, rubs, or gallops.  GASTROINTESTINAL: Soft, nontender, non-distended. No masses. Positive bowel sounds. No hepatosplenomegaly.  MUSCULOSKELETAL: No swelling, clubbing, or edema.  NEUROLOGIC: Mild distress due to acute illness SKIN:intact,warm,dry   PERTINENT DATA        Infusions:  Scheduled Medications: . Chlorhexidine Gluconate Cloth  6 each Topical Daily  . hydrALAZINE  50 mg Oral Q8H  . Melatonin  5 mg Oral QHS  . multivitamin with minerals  1 tablet Oral Daily  . nicotine  14 mg Transdermal Daily  . vitamin C  250 mg Oral Daily   PRN Medications: albuterol, hydrALAZINE, HYDROcodone-acetaminophen, traMADol Hemodynamic parameters:   Intake/Output: 09/26 0701 - 09/27 0700 In: -  Out: 375 [Urine:375]  Ventilator  Settings:     LAB RESULTS:  Basic Metabolic Panel: Recent Labs   Lab 08/09/19 1229 08/10/19 0427  NA 140 141  K 4.1 4.0  CL 105 108  CO2 25 23  GLUCOSE 172* 133*  BUN 13 19  CREATININE 1.21 1.29*  CALCIUM 9.4 9.1  MG 2.2 2.2  PHOS 3.3 3.8   Liver Function Tests: Recent Labs  Lab 08/09/19 1229  AST 31  ALT 43  ALKPHOS 56  BILITOT 1.5*  PROT 7.9  ALBUMIN 4.3   No results for input(s): LIPASE, AMYLASE in the last 168 hours. No results for input(s): AMMONIA in the last 168 hours. CBC: Recent Labs  Lab 08/09/19 1229 08/10/19 0427  WBC 8.7 11.5*  NEUTROABS 5.3  --   HGB 14.6 13.9  HCT 43.6 41.5  MCV 94.8 94.1  PLT 213 208   Cardiac Enzymes: No results for input(s): CKTOTAL, CKMB, CKMBINDEX, TROPONINI in the last 168 hours. BNP: Invalid input(s): POCBNP CBG: Recent Labs  Lab 08/09/19 1659  GLUCAP 220*     IMAGING RESULTS:  Imaging: Dg Chest 1 View  Result Date: 08/09/2019 CLINICAL DATA:  Shortness of breath. EXAM: CHEST  1 VIEW COMPARISON:  May 07, 2017 FINDINGS: The cardiomediastinal silhouette is stable. No pneumothorax. No nodules, masses, or focal infiltrates. No overt edema. IMPRESSION: No acute abnormalities identified. Electronically Signed   By: Gerome Sam III M.D   On: 08/09/2019 13:45      ASSESSMENT AND PLAN      Symptomatic bradyarrhythmia    -Patient now states symptoms have improved denies chest pain    -Requesting tramadol for some pain mostly in left hip    -Second and third-degree AV block     -Cardiology consultation-appreciate input  -oxygen as needed ICU monitoring   Centrilobular emphysema -Patient has crackles at the bases bilaterally -No lower extremity edema, patient denies previous cardiac disease-BNP is mildly elevated over 600 -Lifelong smoking status- smoking cessation counseling -We will place on duo nebs for now -Lasix 20 mg x 1   GI/Nutrition GI PROPHYLAXIS as indicated DIET-->TF's as tolerated Constipation protocol as indicated  ENDO - ICU  hypoglycemic\Hyperglycemia protocol -check FSBS per protocol   ELECTROLYTES -follow labs as needed -replace as needed -pharmacy consultation   DVT/GI PRX ordered -SCDs  TRANSFUSIONS AS NEEDED MONITOR FSBS ASSESS the need for LABS as needed   Critical care provider statement:    Critical care time (minutes):  32   Critical care time was exclusive of:  Separately billable procedures and treating other patients   Critical care was necessary to treat or prevent imminent or life-threatening deterioration of the following conditions:   Symptomatic bradycardia arrhythmia, secondary to third-degree AV block, left hip pain, diabetes, emphysema, multiple comorbid conditions   Critical care was time spent personally by me on the following activities:  Development of treatment plan with patient or surrogate, discussions with consultants, evaluation of patient's response to treatment, examination of patient, obtaining history from patient or surrogate, ordering and performing treatments and interventions, ordering and review of laboratory  studies and re-evaluation of patient's condition.  I assumed direction of critical care for this patient from another provider in my specialty: no    This document was prepared using Dragon voice recognition software and may include unintentional dictation errors.    Vida RiggerFuad Adia Crammer, M.D.  Division of Pulmonary & Critical Care Medicine  [facility] [address] [facility]

## 2019-08-10 NOTE — Progress Notes (Signed)
Pt transferred to room 252, report called to Carlyon Shadow, Therapist, sports.

## 2019-08-10 NOTE — Progress Notes (Signed)
[place] [facility] [place] NAME: Andres Torres    MR#:  [id number]  DATE OF BIRTH:  Jun 06, 1951  SUBJECTIVE:   denies SOB  REVIEW OF SYSTEMS:    Review of Systems  Constitutional: Negative for fever, chills weight loss HENT: Negative for ear pain, nosebleeds, congestion, facial swelling, rhinorrhea, neck pain, neck stiffness and ear discharge.   Respiratory: Negative for cough, shortness of breath, wheezing  Cardiovascular: Negative for chest pain, palpitations and leg swelling.  Gastrointestinal: Negative for heartburn, abdominal pain, vomiting, diarrhea or consitpation Genitourinary: Negative for dysuria, urgency, frequency, hematuria Musculoskeletal: Negative for back pain or joint pain Neurological: Negative for dizziness, seizures, syncope, focal weakness,  numbness and headaches.  Hematological: Does not bruise/bleed easily.  Psychiatric/Behavioral: Negative for hallucinations, confusion, dysphoric mood    Tolerating Diet: yes      DRUG ALLERGIES:  No Known Allergies  VITALS:  Blood pressure (!) 188/68, pulse (!) 111, temperature 98.3 F (36.8 C), temperature source Oral, resp. rate 20, height 5\' 11"  (1.803 m), weight 82 kg, SpO2 92 %.  PHYSICAL EXAMINATION:  Constitutional: Appears well-developed and well-nourished. No distress. HENT: Normocephalic. Marland Kitchen Oropharynx is clear and moist.  Eyes: Conjunctivae and EOM are normal. PERRLA, no scleral icterus.  Neck: Normal ROM. Neck supple. No JVD. No tracheal deviation. CVS: brady S1/S2 +, no murmurs, no gallops, no carotid bruit.  Pulmonary: Effort and breath sounds normal, no stridor, rhonchi, wheezes, rales.  Abdominal: Soft. BS +,  no distension, tenderness, rebound or guarding.  Musculoskeletal: Normal range of motion. No edema and no tenderness.  Neuro: Alert. CN 2-12 grossly intact. No focal deficits. Skin: Skin is warm and dry. No rash noted. Psychiatric: Normal mood and affect.       LABORATORY PANEL:   CBC Recent Labs  Lab 08/10/19 0427  WBC 11.5*  HGB 13.9  HCT 41.5  PLT 208   ------------------------------------------------------------------------------------------------------------------  Chemistries  Recent Labs  Lab 08/09/19 1229 08/10/19 0427  NA 140 141  K 4.1 4.0  CL 105 108  CO2 25 23  GLUCOSE 172* 133*  BUN 13 19  CREATININE 1.21 1.29*  CALCIUM 9.4 9.1  MG 2.2 2.2  AST 31  --   ALT 43  --   ALKPHOS 56  --   BILITOT 1.5*  --    ------------------------------------------------------------------------------------------------------------------  Cardiac Enzymes No results for input(s): TROPONINI in the last 168 hours. ------------------------------------------------------------------------------------------------------------------  RADIOLOGY:  Dg Chest 1 View  Result Date: 08/09/2019 CLINICAL DATA:  Shortness of breath. EXAM: CHEST  1 VIEW COMPARISON:  May 07, 2017 FINDINGS: The cardiomediastinal silhouette is stable. No pneumothorax. No nodules, masses, or focal infiltrates. No overt edema. IMPRESSION: No acute abnormalities identified. Electronically Signed   By: Dorise Bullion III M.D   On: 08/09/2019 13:45     ASSESSMENT AND PLAN:   68 year old male with history of prediabetes who presented to the emergency room from urgent care after being found patient was in second/third degree heart block.  1.  Third-degree AV block: Patient evaluated by cardiologist morning. Avoid AV nodal blocking agents If patient continues to be in complete heart block will need pacemaker Follow-up echocardiogram  2.  Essential hypertension: Continue hydralazine  3. Prediabetes: Diet controlled  4. Tobacco dependence: Patient is encouraged to quit smoking and willing to attempt to quit was assessed. Patient highly motivated.Counseling was provided for 4 minutes. Nicotine patch    Management plans discussed with the patient and  he is  in  agreement.  CODE STATUS: full  TOTAL TIME TAKING CARE OF THIS PATIENT: 30 minutes.     POSSIBLE D/C 2-3 days, DEPENDING ON CLINICAL CONDITION.   Adrian Saran M.D on 08/10/2019 at 10:29 AM  Between 7am to 6pm - Pager - [phone] After 6pm go to [email or web address] - password [email or web address]  [facility]  Office  [phone]  CC: Primary care physician; Sandrea Hughs, NP  Note: This dictation was prepared with Dragon dictation along with smaller phrase technology. Any transcriptional errors that result from this process are unintentional.

## 2019-08-11 ENCOUNTER — Encounter: Payer: Self-pay | Admitting: Anesthesiology

## 2019-08-11 ENCOUNTER — Encounter
Admission: EM | Discharge status: Against Medical Advice | Payer: Self-pay | Source: Home / Self Care | Attending: Internal Medicine

## 2019-08-11 LAB — HIV ANTIBODY (ROUTINE TESTING W REFLEX): HIV Screen 4th Generation wRfx: NONREACTIVE

## 2019-08-11 SURGERY — INSERTION, CARDIAC PACEMAKER
Anesthesia: Moderate Sedation | Laterality: Left

## 2019-08-11 SURGICAL SUPPLY — 35 items
BAG DECANTER FOR FLEXI CONT (MISCELLANEOUS) ×2 IMPLANT
BRUSH SCRUB EZ  4% CHG (MISCELLANEOUS) ×1
BRUSH SCRUB EZ 4% CHG (MISCELLANEOUS) ×1 IMPLANT
CABLE SURG 12 DISP A/V CHANNEL (MISCELLANEOUS) ×2 IMPLANT
CANISTER SUCT 1200ML W/VALVE (MISCELLANEOUS) ×2 IMPLANT
CHLORAPREP W/TINT 26 (MISCELLANEOUS) ×2 IMPLANT
COVER LIGHT HANDLE STERIS (MISCELLANEOUS) ×4 IMPLANT
COVER MAYO STAND REUSABLE (DRAPES) ×2 IMPLANT
COVER WAND RF STERILE (DRAPES) ×2 IMPLANT
DRAPE C-ARM XRAY 36X54 (DRAPES) ×2 IMPLANT
DRSG TEGADERM 4X4.75 (GAUZE/BANDAGES/DRESSINGS) ×2 IMPLANT
DRSG TELFA 4X3 1S NADH ST (GAUZE/BANDAGES/DRESSINGS) ×2 IMPLANT
ELECT REM PT RETURN 9FT ADLT (ELECTROSURGICAL) ×2
ELECTRODE REM PT RTRN 9FT ADLT (ELECTROSURGICAL) ×1 IMPLANT
GLIDEWIRE STIFF .35X180X3 HYDR (WIRE) IMPLANT
GLOVE BIO SURGEON STRL SZ7.5 (GLOVE) ×2 IMPLANT
GLOVE BIO SURGEON STRL SZ8 (GLOVE) ×2 IMPLANT
GOWN STRL REUS W/ TWL LRG LVL3 (GOWN DISPOSABLE) ×1 IMPLANT
GOWN STRL REUS W/ TWL XL LVL3 (GOWN DISPOSABLE) ×1 IMPLANT
GOWN STRL REUS W/TWL LRG LVL3 (GOWN DISPOSABLE) ×1
GOWN STRL REUS W/TWL XL LVL3 (GOWN DISPOSABLE) ×1
IMMOBILIZER SHDR MD LX WHT (SOFTGOODS) IMPLANT
IMMOBILIZER SHDR XL LX WHT (SOFTGOODS) IMPLANT
INTRO PACEMAKR LEAD 9FR 13CM (INTRODUCER) ×2
INTRO PACEMKR SHEATH II 7FR (MISCELLANEOUS) ×2
INTRODUCER PACEMKR LD 9FR 13CM (INTRODUCER) ×1 IMPLANT
INTRODUCER PACEMKR SHTH II 7FR (MISCELLANEOUS) ×1 IMPLANT
IV NS 500ML (IV SOLUTION) ×1
IV NS 500ML BAXH (IV SOLUTION) ×1 IMPLANT
KIT TURNOVER KIT A (KITS) ×2 IMPLANT
LABEL OR SOLS (LABEL) ×2 IMPLANT
MARKER SKIN DUAL TIP RULER LAB (MISCELLANEOUS) ×2 IMPLANT
PACK PACE INSERTION (MISCELLANEOUS) ×2 IMPLANT
PAD ONESTEP ZOLL R SERIES ADT (MISCELLANEOUS) ×2 IMPLANT
SUT SILK 0 SH 30 (SUTURE) ×6 IMPLANT

## 2019-08-11 NOTE — Consult Note (Signed)
[facility] Consultation Note  Patient ID: Andres Torres, MRN: [id number], DOB/AGE: 69/05/1951 69 y.o. Admit date: 08/09/2019   Date of Consult: 08/11/2019 Primary Physician: Sandrea Hughsubio, Jessica, NP Primary Cardiologist: None  Chief Complaint:  Chief Complaint  Patient presents with  . Bradycardia   Reason for Consult: Heart block  HPI: 68 y.o. male with no apparent significant previous cardiovascular history who has done fairly well from a physical standpoint but has had significant progression of shortness of breath with physical activity especially up the steps or and long distances.  This has been significantly worse over the last several months.  The patient was seen in an outside office for other issues and found to have a slow heart rate.  Despite this slow heart rate the patient has not had any previous significant syncope dizziness nausea diaphoresis.  Upon arrival to the emergency room the patient is asymptomatic and sitting up straight.  In addition overnight the patient has not had any significant symptoms.  EKG upon arrival shows's third-degree AV block with left axis deviation and right bundle branch block.  He has had reasonable ventricular rhythm and some conduction at times which also includes the possibility second-degree type II AV block with slow ventricular rate.  With his symptoms listed above and continued issues with slow heart rate throughout the evening the patient will need further pacemaker placement to reduce the possibility of further symptoms and heart block Echocardiogram showing normal LV systolic function with ejection fraction of 60% and no evidence of significant valvular heart disease Past Medical History:  Diagnosis Date  . Diabetes mellitus without complication [facility](HCC)       Surgical History: History reviewed. No pertinent surgical history.   Home Meds: Prior to Admission medications   Medication Sig Start Date End Date Taking? Authorizing  Provider  ibuprofen (ADVIL) 200 MG tablet Take 200 mg by mouth every 6 (six) hours as needed.   Yes [provider]  Multiple Vitamin (MULTIVITAMIN WITH MINERALS) TABS tablet Take 1 tablet by mouth daily.   Yes [provider]  VENTOLIN HFA 108 (90 Base) MCG/ACT inhaler Inhale 2 puffs into the lungs every 6 (six) hours as needed.  03/22/18  Yes [provider]  vitamin C (ASCORBIC ACID) 250 MG tablet Take 250 mg by mouth daily.   Yes [provider]    Inpatient Medications:  . Chlorhexidine Gluconate Cloth  6 each Topical Daily  . gentamicin irrigation  80 mg Irrigation On Call  . hydrALAZINE  50 mg Oral Q8H  . Melatonin  5 mg Oral QHS  . multivitamin with minerals  1 tablet Oral Daily  . nicotine  14 mg Transdermal Daily  . vitamin C  250 mg Oral Daily   . sodium chloride    .  ceFAZolin (ANCEF) IV      Allergies: No Known Allergies  Social History   Socioeconomic History  . Marital status: Single    Spouse name: Not on file  . Number of children: Not on file  . Years of education: Not on file  . Highest education level: Not on file  Occupational History  . Not on file  Social Needs  . Financial resource strain: Not on file  . Food insecurity    Worry: Not on file    Inability: Not on file  . Transportation needs    Medical: Not on file    Non-medical: Not on file  Tobacco Use  . Smoking status: Former  Smoker    Quit date: 03/24/2018    Years since quitting: 1.3  . Smokeless tobacco: Never Used  Substance and Sexual Activity  . Alcohol use: Not Currently  . Drug use: Not Currently    Comment: 50 years ago  . Sexual activity: Not on file  Lifestyle  . Physical activity    Days per week: Not on file    Minutes per session: Not on file  . Stress: Not on file  Relationships  . Social Herbalist on phone: Not on file    Gets together: Not on file    Attends religious service: Not on file    Active member of club or  organization: Not on file    Attends meetings of clubs or organizations: Not on file    Relationship status: Not on file  . Intimate partner violence    Fear of current or ex partner: Not on file    Emotionally abused: Not on file    Physically abused: Not on file    Forced sexual activity: Not on file  Other Topics Concern  . Not on file  Social History Narrative  . Not on file     Family History  Problem Relation Age of Onset  . Arthritis Mother   . Diabetes Father   . Kidney disease Father      Review of Systems Positive for shortness of breath Negative for: General:  chills, fever, night sweats or weight changes.  Cardiovascular: PND orthopnea syncope dizziness  Dermatological skin lesions rashes Respiratory: Cough congestion Urologic: Frequent urination urination at night and hematuria Abdominal: negative for nausea, vomiting, diarrhea, bright red blood per rectum, melena, or hematemesis Neurologic: negative for visual changes, and/or hearing changes  All other systems reviewed and are otherwise negative except as noted above.  Labs: No results for input(s): CKTOTAL, CKMB, TROPONINI in the last 72 hours. Lab Results  Component Value Date   WBC 11.5 (H) 08/10/2019   HGB 13.9 08/10/2019   HCT 41.5 08/10/2019   MCV 94.1 08/10/2019   PLT 208 08/10/2019    Recent Labs  Lab 08/09/19 1229 08/10/19 0427  NA 140 141  K 4.1 4.0  CL 105 108  CO2 25 23  BUN 13 19  CREATININE 1.21 1.29*  CALCIUM 9.4 9.1  PROT 7.9  --   BILITOT 1.5*  --   ALKPHOS 56  --   ALT 43  --   AST 31  --   GLUCOSE 172* 133*   No results found for: CHOL, HDL, LDLCALC, TRIG No results found for: DDIMER  Radiology/Studies:  Dg Chest 1 View  Result Date: 08/09/2019 CLINICAL DATA:  Shortness of breath. EXAM: CHEST  1 VIEW COMPARISON:  May 07, 2017 FINDINGS: The cardiomediastinal silhouette is stable. No pneumothorax. No nodules, masses, or focal infiltrates. No overt edema. IMPRESSION: No  acute abnormalities identified. Electronically Signed   By: Dorise Bullion III M.D   On: 08/09/2019 13:45    EKG: Normal sinus rhythm with high degree heart block and left axis deviation right with right bundle branch block  Weights: Filed Weights   08/09/19 1228 08/09/19 1658 08/11/19 0610  Weight: 83.9 kg 82 kg 81.3 kg     Physical Exam: Blood pressure (!) 181/54, pulse (!) 41, temperature 98.2 F (36.8 C), temperature source Oral, resp. rate 16, height 5\' 11"  (1.803 m), weight 81.3 kg, SpO2 100 %. Body mass index is 24.99 kg/m. General: Well developed, well  nourished, in no acute distress. Head eyes ears nose throat: Normocephalic, atraumatic, sclera non-icteric, no xanthomas, nares are without discharge. No apparent thyromegaly and/or mass  Lungs: Normal respiratory effort.  no wheezes, no rales, no rhonchi.  Heart: RRR with normal S1 S2. no murmur gallop, no rub, PMI is normal size and placement, carotid upstroke normal without bruit, jugular venous pressure is normal Abdomen: Soft, non-tender, non-distended with normoactive bowel sounds. No hepatomegaly. No rebound/guarding. No obvious abdominal masses. Abdominal aorta is normal size without bruit Extremities: No edema. no cyanosis, no clubbing, no ulcers  Peripheral : 2+ bilateral upper extremity pulses, 2+ bilateral femoral pulses, 2+ bilateral dorsal pedal pulse Neuro: Alert and oriented. No facial asymmetry. No focal deficit. Moves all extremities spontaneously. Musculoskeletal: Normal muscle tone without kyphosis Psych:  Responds to questions appropriately with a normal affect.    Assessment: 68 year old male with significant progression of shortness of breath and high degree heart block with abnormal EKG consistent with chronotropic incompetence and need for further evaluation and treatment options for heart block.  Patient has been instructed that AV dual-chamber pacemaker placement would help this situation and appropriate  at this time.  He understands the risk and benefits of this pacemaker placement.  This includes the possibility of death stroke heart attack infection bleeding blood clot hemopericardium pneumothorax and infection.  The patient is at low risk for conscious sedation Patient has decided that he wishes to defer permanent pacemaker placement after long discussion of risks benefits and full education of the procedure.  He understands the potential for significant cardiovascular event.  He will talk with his daughter about the potential of this and further inform us his wishes.  Would continue ambulation and follow-up for any new significant issues related to above Plan: 1.  Proceed to dual-chamber pacemaker placement if patient wishes to proceed at this time 2.  No AV node blocking agents necessary 3.  Further treatment options after above  Signed, Lamar Blinks M.D. [facility] Cardiology 08/11/2019, 8:39 AM

## 2019-08-11 NOTE — Plan of Care (Signed)
  Problem: Education: Goal: Knowledge of General Education information will improve Description Including pain rating scale, medication(s)/side effects and non-pharmacologic comfort measures Outcome: Progressing   

## 2019-08-11 NOTE — Progress Notes (Addendum)
Patient upset this morning during bedside report handoff. Patient stating that he was not notified of pacemaker placement procedure today. He states wanting to speak to MD this morning to ask to be checked out. Secure chat sent to Dr. Benjie Karvonen and Dr. Nehemiah Massed. Per Dr.Kowalski he will come to talk to patient this morning.   Update 1000: After speaking with Dr. Benjie Karvonen and Dr. Nehemiah Massed this morning, patient leaving against medical advise. Patient is aware of what that means and signed AMA paper. Both MD's aware. IV taken out and tele monitor off.

## 2019-08-11 NOTE — Discharge Summary (Signed)
68 year old male with history of prediabetes who presents to the emergency room due to heart block found at urgent care.  Patient did not want to stay in the hospital for pacemaker.  He was eval by cardiology.  Patient left AMA.

## 2019-08-11 NOTE — Progress Notes (Signed)
[facility] NAME: Andres Torres    MR#:  [id number]  DATE OF BIRTH:  07-04-1951  SUBJECTIVE:  Patient wants to leave.  REVIEW OF SYSTEMS:    Review of Systems  Constitutional: Negative for fever, chills weight loss HENT: Negative for ear pain, nosebleeds, congestion, facial swelling, rhinorrhea, neck pain, neck stiffness and ear discharge.   Respiratory: Negative for cough, shortness of breath, wheezing  Cardiovascular: Negative for chest pain, palpitations and leg swelling.  Gastrointestinal: Negative for heartburn, abdominal pain, vomiting, diarrhea or consitpation Genitourinary: Negative for dysuria, urgency, frequency, hematuria Musculoskeletal: Negative for back pain or joint pain Neurological: Negative for dizziness, seizures, syncope, focal weakness,  numbness and headaches.  Hematological: Does not bruise/bleed easily.  Psychiatric/Behavioral: Negative for hallucinations, confusion, dysphoric mood    Tolerating Diet: yes      DRUG ALLERGIES:  No Known Allergies  VITALS:  Blood pressure (!) 181/54, pulse (!) 41, temperature 98.2 F (36.8 C), temperature source Oral, resp. rate 16, height 5\' 11"  (1.803 m), weight 81.3 kg, SpO2 100 %.  PHYSICAL EXAMINATION:  Constitutional: Appears well-developed and well-nourished. No distress. HENT: Normocephalic. Marland Kitchen Oropharynx is clear and moist.  Eyes: Conjunctivae and EOM are normal. PERRLA, no scleral icterus.  Neck: Normal ROM. Neck supple. No JVD. No tracheal deviation. CVS: bradycardia, S1/S2 +, no murmurs, no gallops, no carotid bruit.  Pulmonary: Effort and breath sounds normal, no stridor, rhonchi, wheezes, rales.  Abdominal: Soft. BS +,  no distension, tenderness, rebound or guarding.  Musculoskeletal: Normal range of motion. No edema and no tenderness.  Neuro: Alert. CN 2-12 grossly intact. No focal deficits. Skin: Skin is warm and dry. No rash noted. Psychiatric:  Normal mood and affect.      LABORATORY PANEL:   CBC Recent Labs  Lab 08/10/19 0427  WBC 11.5*  HGB 13.9  HCT 41.5  PLT 208   ------------------------------------------------------------------------------------------------------------------  Chemistries  Recent Labs  Lab 08/09/19 1229 08/10/19 0427  NA 140 141  K 4.1 4.0  CL 105 108  CO2 25 23  GLUCOSE 172* 133*  BUN 13 19  CREATININE 1.21 1.29*  CALCIUM 9.4 9.1  MG 2.2 2.2  AST 31  --   ALT 43  --   ALKPHOS 56  --   BILITOT 1.5*  --    ------------------------------------------------------------------------------------------------------------------  Cardiac Enzymes No results for input(s): TROPONINI in the last 168 hours. ------------------------------------------------------------------------------------------------------------------  RADIOLOGY:  Dg Chest 1 View  Result Date: 08/09/2019 CLINICAL DATA:  Shortness of breath. EXAM: CHEST  1 VIEW COMPARISON:  May 07, 2017 FINDINGS: The cardiomediastinal silhouette is stable. No pneumothorax. No nodules, masses, or focal infiltrates. No overt edema. IMPRESSION: No acute abnormalities identified. Electronically Signed   By: Dorise Bullion III M.D   On: 08/09/2019 13:45     ASSESSMENT AND PLAN:   68 year old male diabetes who presents emergency room from urgent care after being found that he was in second/third degree AV block.  1.  Third-degree AV block: Patient needs pacemaker however wants to leave AMA and does not want to wait.  2.  Essential hypertension: Continue hydralazine  3. Prediabetes: Diet controlled  4. Tobacco dependence: Patient is encouraged to quit smoking and willing to attempt to quit was assessed. Patient highly motivated.Counseling was provided for 4 minutes. Nicotine patch    Management plans discussed with the patient and he is in agreement.  CODE STATUS: full  TOTAL TIME TAKING CARE OF THIS PATIENT:  24 minutes.      POSSIBLE D/C ?, DEPENDING ON CLINICAL CONDITION.   Adrian Saran M.D on 08/11/2019 at 11:25 AM  Between 7am to 6pm - Pager - [phone] After 6pm go to [email or web address] - password [email or web address]  [facility]  Office  [phone]  CC: Primary care physician; Sandrea Hughs, NP  Note: This dictation was prepared with Dragon dictation along with smaller phrase technology. Any transcriptional errors that result from this process are unintentional.

## 2019-08-15 LAB — CULTURE, BLOOD (ROUTINE X 2)
Culture: NO GROWTH
Culture: NO GROWTH

## 2019-08-25 ENCOUNTER — Other Ambulatory Visit
Admission: RE | Admit: 2019-08-25 | Discharge: 2019-08-25 | Disposition: A | Discharge status: Home / Self Care | Payer: Medicare Other | Source: Ambulatory Visit | Attending: Cardiology | Admitting: Cardiology

## 2019-08-25 DIAGNOSIS — Z20828 Contact with and (suspected) exposure to other viral communicable diseases: Secondary | ICD-10-CM | POA: Insufficient documentation

## 2019-08-25 DIAGNOSIS — Z01812 Encounter for preprocedural laboratory examination: Secondary | ICD-10-CM | POA: Insufficient documentation

## 2019-08-25 LAB — SARS CORONAVIRUS 2 (TAT 6-24 HRS): SARS Coronavirus 2: NEGATIVE

## 2019-08-28 ENCOUNTER — Observation Stay
Admission: RE | Admit: 2019-08-28 | Discharge: 2019-08-29 | Disposition: A | Discharge status: Home / Self Care | Payer: Medicare Other | Source: Ambulatory Visit | Attending: Cardiology | Admitting: Cardiology

## 2019-08-28 ENCOUNTER — Encounter
Admission: RE | Disposition: A | Discharge status: Home / Self Care | Payer: Self-pay | Source: Ambulatory Visit | Attending: Cardiology

## 2019-08-28 ENCOUNTER — Other Ambulatory Visit: Payer: Self-pay

## 2019-08-28 DIAGNOSIS — I495 Sick sinus syndrome: Principal | ICD-10-CM | POA: Insufficient documentation

## 2019-08-28 DIAGNOSIS — Z87891 Personal history of nicotine dependence: Secondary | ICD-10-CM | POA: Diagnosis not present

## 2019-08-28 DIAGNOSIS — R001 Bradycardia, unspecified: Secondary | ICD-10-CM | POA: Diagnosis present

## 2019-08-28 DIAGNOSIS — E119 Type 2 diabetes mellitus without complications: Secondary | ICD-10-CM | POA: Insufficient documentation

## 2019-08-28 DIAGNOSIS — F329 Major depressive disorder, single episode, unspecified: Secondary | ICD-10-CM | POA: Diagnosis not present

## 2019-08-28 DIAGNOSIS — G894 Chronic pain syndrome: Secondary | ICD-10-CM | POA: Insufficient documentation

## 2019-08-28 DIAGNOSIS — I442 Atrioventricular block, complete: Secondary | ICD-10-CM | POA: Diagnosis present

## 2019-08-28 HISTORY — DX: Sick sinus syndrome: I49.5

## 2019-08-28 HISTORY — PX: TEMPORARY PACEMAKER: CATH118268

## 2019-08-28 HISTORY — PX: PACEMAKER LEADLESS INSERTION: EP1219

## 2019-08-28 LAB — GLUCOSE, CAPILLARY
Glucose-Capillary: 119 mg/dL — ABNORMAL HIGH (ref 70–99)
Glucose-Capillary: 152 mg/dL — ABNORMAL HIGH (ref 70–99)

## 2019-08-28 SURGERY — PACEMAKER LEADLESS INSERTION
Anesthesia: Moderate Sedation | Laterality: Right

## 2019-08-28 MED ORDER — SODIUM CHLORIDE 0.45 % IV SOLN: INTRAVENOUS | Status: DC

## 2019-08-28 MED ORDER — MIDAZOLAM HCL 2 MG/2ML IJ SOLN
INTRAMUSCULAR | Status: AC
  Filled 2019-08-28: qty 2

## 2019-08-28 MED ORDER — IOHEXOL 300 MG/ML  SOLN
INTRAMUSCULAR | Status: DC | PRN
  Administered 2019-08-28: 30 mL via INTRA_ARTERIAL

## 2019-08-28 MED ORDER — ACETAMINOPHEN 325 MG PO TABS: 650.0000 mg | ORAL_TABLET | ORAL | Status: DC | PRN

## 2019-08-28 MED ORDER — FENTANYL CITRATE (PF) 100 MCG/2ML IJ SOLN
INTRAMUSCULAR | Status: AC
  Filled 2019-08-28: qty 2

## 2019-08-28 MED ORDER — HEPARIN (PORCINE) IN NACL 1000-0.9 UT/500ML-% IV SOLN
INTRAVENOUS | Status: DC | PRN
  Administered 2019-08-28: 500 mL

## 2019-08-28 MED ORDER — SODIUM CHLORIDE 0.9% FLUSH: 3.0000 mL | INTRAVENOUS | Status: DC | PRN

## 2019-08-28 MED ORDER — HEPARIN (PORCINE) IN NACL 1000-0.9 UT/500ML-% IV SOLN
INTRAVENOUS | Status: AC
  Filled 2019-08-28: qty 1000

## 2019-08-28 MED ORDER — FENTANYL CITRATE (PF) 100 MCG/2ML IJ SOLN
INTRAMUSCULAR | Status: DC | PRN
  Administered 2019-08-28 (×3): 25 ug via INTRAVENOUS

## 2019-08-28 MED ORDER — SODIUM CHLORIDE 0.9% FLUSH
3.0000 mL | Freq: Two times a day (BID) | INTRAVENOUS | Status: DC
  Administered 2019-08-28 – 2019-08-29 (×2): 3 mL via INTRAVENOUS

## 2019-08-28 MED ORDER — TRAMADOL HCL 50 MG PO TABS
ORAL_TABLET | ORAL | Status: AC
  Filled 2019-08-28: qty 1

## 2019-08-28 MED ORDER — HEPARIN SODIUM (PORCINE) 1000 UNIT/ML IJ SOLN
INTRAMUSCULAR | Status: DC | PRN
  Administered 2019-08-28: 4500 [IU] via INTRAVENOUS

## 2019-08-28 MED ORDER — TRAMADOL HCL 50 MG PO TABS
50.0000 mg | ORAL_TABLET | ORAL | Status: DC | PRN
  Administered 2019-08-28 – 2019-08-29 (×2): 50 mg via ORAL
  Filled 2019-08-28 (×2): qty 1

## 2019-08-28 MED ORDER — SODIUM CHLORIDE 0.9 % IV SOLN
INTRAVENOUS | Status: DC
  Administered 2019-08-28: 07:00:00 via INTRAVENOUS

## 2019-08-28 MED ORDER — SODIUM CHLORIDE 0.9 % IV SOLN: 250.0000 mL | INTRAVENOUS | Status: DC | PRN

## 2019-08-28 MED ORDER — FLUTICASONE PROPIONATE 50 MCG/ACT NA SUSP
2.0000 | Freq: Every day | NASAL | Status: DC
  Filled 2019-08-28: qty 16

## 2019-08-28 MED ORDER — ALBUTEROL SULFATE (2.5 MG/3ML) 0.083% IN NEBU: 2.5000 mg | INHALATION_SOLUTION | Freq: Four times a day (QID) | RESPIRATORY_TRACT | Status: DC | PRN

## 2019-08-28 MED ORDER — MIDAZOLAM HCL 2 MG/2ML IJ SOLN
INTRAMUSCULAR | Status: DC | PRN
  Administered 2019-08-28 (×3): 1 mg via INTRAVENOUS

## 2019-08-28 MED ORDER — ONDANSETRON HCL 4 MG/2ML IJ SOLN: 4.0000 mg | Freq: Four times a day (QID) | INTRAMUSCULAR | Status: DC | PRN

## 2019-08-28 MED ORDER — HEPARIN SODIUM (PORCINE) 1000 UNIT/ML IJ SOLN
INTRAMUSCULAR | Status: AC
  Filled 2019-08-28: qty 1

## 2019-08-28 MED ORDER — TRAMADOL HCL 50 MG PO TABS
50.0000 mg | ORAL_TABLET | Freq: Four times a day (QID) | ORAL | Status: DC | PRN
  Administered 2019-08-28: 50 mg via ORAL

## 2019-08-28 SURGICAL SUPPLY — 18 items
CABLE ADAPT PACING TEMP 12FT (ADAPTER) ×3 IMPLANT
DILATOR VESSEL 38 20CM 12FR (INTRODUCER) ×3 IMPLANT
DILATOR VESSEL 38 20CM 14FR (INTRODUCER) ×3 IMPLANT
DILATOR VESSEL 38 20CM 18FR (INTRODUCER) ×3 IMPLANT
DILATOR VESSEL 38 20CM 8FR (INTRODUCER) ×3 IMPLANT
GUIDEWIRE SUPER STIFF .035X180 (WIRE) ×3 IMPLANT
MICRA AV TRANSCATH PACING SYS (Pacemaker) ×3 IMPLANT
MICRA INTRODUCER SHEATH (SHEATH) ×3
NEEDLE PERC 18GX7CM (NEEDLE) ×6 IMPLANT
PACK CARDIAC CATH (CUSTOM PROCEDURE TRAY) ×3 IMPLANT
SHEATH AVANTI 6FR X 11CM (SHEATH) ×3 IMPLANT
SHEATH AVANTI 7FRX11 (SHEATH) ×3 IMPLANT
SHEATH INTRODUCER MICRA (SHEATH) ×2 IMPLANT
SLEEVE REPOSITIONING LENGTH 30 (MISCELLANEOUS) ×3 IMPLANT
SYSTEM PACING TRNSCTH AV MICRA (Pacemaker) ×2 IMPLANT
TUBING CIL FLEX 10 FLL-RA (TUBING) ×3 IMPLANT
WIRE GUIDERIGHT .035X150 (WIRE) ×3 IMPLANT
WIRE PACING TEMP ST TIP 5 (CATHETERS) ×3 IMPLANT

## 2019-08-28 NOTE — Progress Notes (Signed)
Amy RN notify Dr. Saralyn Pilar if we can increase his tramadol to q4 prn since he is complaining of his femoral site hurting, order given. Also notified MD about patient taking his own medication when we walk in his room, he was taking coq10, hawthorn and multivitamin he was refusing for [place] to send this medication to pharmacy and he wants to keep it to himself. RN will continue to monitor.

## 2019-08-29 DIAGNOSIS — I495 Sick sinus syndrome: Secondary | ICD-10-CM | POA: Diagnosis not present

## 2019-08-29 LAB — BASIC METABOLIC PANEL
Anion gap: 11 (ref 5–15)
BUN: 19 mg/dL (ref 8–23)
CO2: 23 mmol/L (ref 22–32)
Calcium: 9 mg/dL (ref 8.9–10.3)
Chloride: 103 mmol/L (ref 98–111)
Creatinine, Ser: 1.19 mg/dL (ref 0.61–1.24)
GFR calc Af Amer: >60 mL/min (ref >60)
GFR calc non Af Amer: >60 mL/min (ref >60)
Glucose, Bld: 135 mg/dL — ABNORMAL HIGH (ref 70–99)
Potassium: 4.7 mmol/L (ref 3.5–5.1)
Sodium: 137 mmol/L (ref 135–145)

## 2019-08-29 NOTE — Care Management Obs Status (Signed)
[facility] NOTIFICATION   Patient Details  Name: Andres Torres MRN: [id number] Date of Birth: 08-06-1951   Medicare Observation Status Notification Given:  Yes    Ross Ludwig, LCSW 08/29/2019, 10:16 AM

## 2019-08-29 NOTE — TOC Transition Note (Signed)
Transition of Care [facility]) - CM/SW Discharge Note   Patient Details  Name: Andres Torres MRN: [id number] Date of Birth: Oct 08, 1951  [facility] of [facility] CM/SW Contact:  Ross Ludwig, LCSW Phone Number: 08/29/2019, 12:13 PM   Clinical Narrative:     Patient is a 68 year old male who is alert and oriented x4.  Patient states he lives alone in an apartment.  Patient stated that he does not feel like he needs home health.  Patient did say he was looking for some help in the home to help with grocery shopping, and minor cleaning around the home.  Patient also stated that he was interested in senior housing.  CSW informed patient that he will have to contact DSS to work on different housing, CSW provided patient with [employer] resources and gave contact information for him.  CSW also informed patient to contact DSS and inquire about CAPS program since he does have Medicaid.  Patient provided contact info for in home care agencies as well.  CSW informed him they will be private pay, but may be able to help with needs.   Final next level of care: Home/Self Care Barriers to Discharge: Barriers Resolved   Patient Goals and CMS Choice Patient states their goals for this hospitalization and ongoing recovery are:: To return back home.      Discharge Placement  Patient will be discharging back home today.                     Discharge Plan and Services                DME Arranged: N/A           HH Agency: NA        Social Determinants of Health (SDOH) Interventions     Readmission Risk Interventions No flowsheet data found.

## 2019-08-29 NOTE — Discharge Instructions (Signed)
Pacemaker Implantation, Adult Pacemaker implantation is a procedure to place a pacemaker inside your chest. A pacemaker is a small computer that sends electrical signals to the heart and helps your heart beat normally. A pacemaker also stores information about your heart rhythms. You may need pacemaker implantation if you:  Have a slow heartbeat (bradycardia).  Faint (syncope).  Have shortness of breath (dyspnea) due to heart problems. The pacemaker attaches to your heart through a wire, called a lead. Sometimes just one lead is needed. Other times, there will be two leads. There are two types of pacemakers:  Transvenous pacemaker. This type is placed under the skin or muscle of your chest. The lead goes through a vein in the chest area to reach the inside of the heart.  Epicardial pacemaker. This type is placed under the skin or muscle of your chest or belly. The lead goes through your chest to the outside of the heart. Tell a health care provider about:  Any allergies you have.  All medicines you are taking, including vitamins, herbs, eye drops, creams, and over-the-counter medicines.  Any problems you or family members have had with anesthetic medicines.  Any blood or bone disorders you have.  Any surgeries you have had.  Any medical conditions you have.  Whether you are pregnant or may be pregnant. What are the risks? Generally, this is a safe procedure. However, problems may occur, including:  Infection.  Bleeding.  Failure of the pacemaker or the lead.  Collapse of a lung or bleeding into a lung.  Blood clot inside a blood vessel with a lead.  Damage to the heart.  Infection inside the heart (endocarditis).  Allergic reactions to medicines. What happens before the procedure? Staying hydrated Follow instructions from your health care provider about hydration, which may include:  Up to 2 hours before the procedure - you may continue to drink clear liquids, such  as water, clear fruit juice, black coffee, and plain tea. Eating and drinking restrictions Follow instructions from your health care provider about eating and drinking, which may include:  8 hours before the procedure - stop eating heavy meals or foods such as meat, fried foods, or fatty foods.  6 hours before the procedure - stop eating light meals or foods, such as toast or cereal.  6 hours before the procedure - stop drinking milk or drinks that contain milk.  2 hours before the procedure - stop drinking clear liquids. Medicines  Ask your health care provider about: ? Changing or stopping your regular medicines. This is especially important if you are taking diabetes medicines or blood thinners. ? Taking medicines such as aspirin and ibuprofen. These medicines can thin your blood. Do not take these medicines before your procedure if your health care provider instructs you not to.  You may be given antibiotic medicine to help prevent infection. General instructions  You will have a heart evaluation. This may include an electrocardiogram (ECG), chest X-ray, and heart imaging (echocardiogram,  or echo) tests.  You will have blood tests.  Do not use any products that contain nicotine or tobacco, such as cigarettes and e-cigarettes. If you need help quitting, ask your health care provider.  Plan to have someone take you home from the hospital or clinic.  If you will be going home right after the procedure, plan to have someone with you for 24 hours.  Ask your health care provider how your surgical site will be marked or identified. What happens during the  procedure?  To reduce your risk of infection: ? Your health care team will wash or sanitize their hands. ? Your skin will be washed with soap. ? Hair may be removed from the surgical area.  An IV tube will be inserted into one of your veins.  You will be given one or more of the following: ? A medicine to help you relax  (sedative). ? A medicine to numb the area (local anesthetic). ? A medicine to make you fall asleep (general anesthetic).  If you are getting a transvenous pacemaker: ? An incision will be made in your upper chest. ? A pocket will be made for the pacemaker. It may be placed under the skin or between layers of muscle. ? The lead will be inserted into a blood vessel that returns to the heart. ? While X-rays are taken by an imaging machine (fluoroscopy), the lead will be advanced through the vein to the inside of your heart. ? The other end of the lead will be tunneled under the skin and attached to the pacemaker.  If you are getting an epicardial pacemaker: ? An incision will be made near your ribs or breastbone (sternum) for the lead. ? The lead will be attached to the outside of your heart. ? Another incision will be made in your chest or upper belly to create a pocket for the pacemaker. ? The free end of the lead will be tunneled under the skin and attached to the pacemaker.  The transvenous or epicardial pacemaker will be tested. Imaging studies may be done to check the lead position.  The incisions will be closed with stitches (sutures), adhesive strips, or skin glue.  Bandages (dressing) will be placed over the incisions. The procedure may vary among health care providers and hospitals. What happens after the procedure?  Your blood pressure, heart rate, breathing rate, and blood oxygen level will be monitored until the medicines you were given have worn off.  You will be given antibiotics and pain medicine.  ECG and chest x-rays will be done.  You will wear a continuous type of ECG (Holter monitor) to check your heart rhythm.  Your health care provider will program the pacemaker.  Do not drive for 24 hours if you received a sedative. This information is not intended to replace advice given to you by your health care provider. Make sure you discuss any questions you have with  your health care provider. Document Released: 10/20/2002 Document Revised: 07/19/2018 Document Reviewed: 04/12/2016 Elsevier Patient Education  2020 [employer].

## 2020-06-08 ENCOUNTER — Encounter: Payer: Self-pay | Admitting: Student in an Organized Health Care Education/Training Program

## 2020-06-08 ENCOUNTER — Ambulatory Visit
Payer: Medicare Other | Attending: Student in an Organized Health Care Education/Training Program | Admitting: Student in an Organized Health Care Education/Training Program

## 2020-06-08 VITALS — BP 144/86 | HR 77 | Temp 97.2°F | Resp 14 | Ht 71.0 in | Wt 179.0 lb

## 2020-06-08 DIAGNOSIS — M5412 Radiculopathy, cervical region: Secondary | ICD-10-CM | POA: Diagnosis not present

## 2020-06-08 DIAGNOSIS — M5136 Other intervertebral disc degeneration, lumbar region: Secondary | ICD-10-CM | POA: Diagnosis present

## 2020-06-08 DIAGNOSIS — M47816 Spondylosis without myelopathy or radiculopathy, lumbar region: Secondary | ICD-10-CM | POA: Insufficient documentation

## 2020-06-08 DIAGNOSIS — M545 Low back pain: Secondary | ICD-10-CM | POA: Diagnosis present

## 2020-06-08 DIAGNOSIS — M1612 Unilateral primary osteoarthritis, left hip: Secondary | ICD-10-CM | POA: Insufficient documentation

## 2020-06-08 DIAGNOSIS — M47812 Spondylosis without myelopathy or radiculopathy, cervical region: Secondary | ICD-10-CM | POA: Insufficient documentation

## 2020-06-08 DIAGNOSIS — M7918 Myalgia, other site: Secondary | ICD-10-CM | POA: Insufficient documentation

## 2020-06-08 DIAGNOSIS — Z79899 Other long term (current) drug therapy: Secondary | ICD-10-CM | POA: Diagnosis present

## 2020-06-08 DIAGNOSIS — G894 Chronic pain syndrome: Secondary | ICD-10-CM | POA: Diagnosis present

## 2020-06-08 DIAGNOSIS — M5459 Other low back pain: Secondary | ICD-10-CM

## 2020-06-08 MED ORDER — HYDROCODONE-ACETAMINOPHEN 7.5-325 MG PO TABS: 1.0000 | ORAL_TABLET | Freq: Two times a day (BID) | ORAL | 0 refills | Status: AC | PRN

## 2020-06-08 NOTE — Progress Notes (Signed)
Safety precautions to be maintained throughout the outpatient stay will include: orient to surroundings, keep bed in low position, maintain call bell within reach at all times, provide assistance with transfer out of bed and ambulation.  

## 2020-06-08 NOTE — Assessment & Plan Note (Signed)
Inadequate analgesia with tramadol 50 mg twice daily as needed prescribed by PCP.  We will transition to hydrocodone 7.5 mg twice daily as needed to help manage his neck pain related to cervical radiculopathy, cervical facet joint syndrome as well as left hip osteoarthritis causing low back and left hip pain.

## 2020-06-08 NOTE — Progress Notes (Signed)
PROVIDER NOTE: Information contained herein reflects review and annotations entered in association with encounter. Interpretation of such information and data should be left to medically-trained personnel. Information provided to patient can be located elsewhere in the medical record under "Patient Instructions". Document created using STT-dictation technology, any transcriptional errors that may result from process are unintentional.    Patient: Andres Torres  Service Category: E/M  Provider: Gillis Santa, MD  DOB: Dec 21, 1950  DOS: 06/08/2020  Specialty: Interventional Pain Management  MRN: [id number]  Setting: Ambulatory outpatient  PCP: Freddy Finner, NP  Type: Established Patient    Referring Provider: Freddy Finner, NP  Location: Office  Delivery: Face-to-face     HPI  Reason for encounter: [name], a 69 y.o. year old male, is here today for evaluation and management of his Cervical radiculopathy [M54.12]. [name] primary complain today is Neck Pain, Back Pain (lower), and Hip Pain (left)  Pertinent problems: [name] has Localized osteoarthrosis of left hip; Foot arch pain; Chronic pain syndrome; Lumbar degenerative disc disease; Chronic right shoulder pain; Lumbar spondylosis; Lumbar facet joint pain; and Cervical facet joint syndrome on their pertinent problem list. Pain Assessment: Severity of Chronic pain is reported as a 8 /10. Location: Back Lower/deneis. Onset: More than a month ago. Quality: Aching, Throbbing. Timing: Constant. Modifying factor(s): medications, hydrotherapy. Vitals:  height is _0  (1.803 m) and weight is 179 lb (81.2 kg). His temporal temperature is 97.2 F (36.2 C) (abnormal). His blood pressure is 144/86 (abnormal) and his pulse is 77. His respiration is 14 and oxygen saturation is 100%.   [name] his last visit with me was on 04/09/2019 for his cervical radicular pain.  Since then, he has been seeing his primary care provider for pain management.   She has been managing his tramadol 50 mg twice a day as needed however he states that he is not receiving analgesic benefit with this medication.  He complains of left hip pain and chronic neck pain.  He did find benefit that was superior to tramadol when he was on hydrocodone 7.5 mg daily as needed.  We will send in a new prescription below and obtain updated urine toxicology screen.  Of note patient did have a appointment with cardiology regarding his pacemaker.  Pharmacotherapy Assessment   Analgesic: Currently on tramadol 50 mg twice daily as needed however not obtaining analgesic benefit, will transition to hydrocodone 7.5 mg twice daily as needed which has done better for him in the past.  Monitoring: [place] PMP: PDMP not reviewed this encounter.       Pharmacotherapy: No side-effects or adverse reactions reported. Compliance: No problems identified. Effectiveness: Clinically acceptable.  Andres Martins, RN  06/08/2020  8:15 AM  Sign when Signing Visit Safety precautions to be maintained throughout the outpatient stay will include: orient to surroundings, keep bed in low position, maintain call bell within reach at all times, provide assistance with transfer out of bed and ambulation.     UDS:  Summary  Date Value Ref Range Status  04/23/2018 FINAL  Final    Comment:    ==================================================================== [facility] COMP DRUG ANALYSIS,UR ==================================================================== Test                             Result       Flag       Units Drug Present and Declared for Prescription Verification   Tramadol                       >  2841        EXPECTED   ng/mg creat   O-Desmethyltramadol            >2841        EXPECTED   ng/mg creat   N-Desmethyltramadol            1177         EXPECTED   ng/mg creat    Source of tramadol is a prescription medication.    O-desmethyltramadol and N-desmethyltramadol are expected    metabolites  of tramadol. Drug Present not Declared for Prescription Verification   Carboxy-THC                    18           UNEXPECTED ng/mg creat    Carboxy-THC is a metabolite of tetrahydrocannabinol  (THC).    Source of [facility] is most commonly illicit, but THC is also present    in a scheduled prescription medication.   Acetaminophen                  PRESENT      UNEXPECTED Drug Absent but Declared for Prescription Verification   Tizanidine                     Not Detected UNEXPECTED    Tizanidine, as indicated in the declared medication list, is not    always detected even when used as directed.   Diclofenac                     Not Detected UNEXPECTED    Diclofenac, as indicated in the declared medication list, is not    always detected even when used as directed.   Salicylate                     Not Detected UNEXPECTED    Aspirin, as indicated in the declared medication list, is not    always detected even when used as directed. ==================================================================== Test                      Result    Flag   Units      Ref Range   Creatinine              176              mg/dL      >=20 ==================================================================== Declared Medications:  The flagging and interpretation on this report are based on the  following declared medications.  Unexpected results may arise from  inaccuracies in the declared medications.  **Note: The testing scope of this panel includes these medications:  Tramadol (Ultram)  **Note: The testing scope of this panel does not include small to  moderate amounts of these reported medications:  Aspirin (Aspirin 81)  Diclofenac (Voltaren)  Tizanidine (Zanaflex)  **Note: The testing scope of this panel does not include following  reported medications:  Albuterol (Ventolin HFA) ==================================================================== For clinical consultation, please call [phone]. ====================================================================      ROS  Constitutional: Denies any fever or chills Gastrointestinal: No reported hemesis, hematochezia, vomiting, or acute GI distress Musculoskeletal: Neck pain, left hip pain, low back pain Neurological: No reported episodes of acute onset apraxia, aphasia, dysarthria, agnosia, amnesia, paralysis, loss of coordination, or loss of consciousness  Medication Review  CoQ10, HYDROcodone-acetaminophen, albuterol, atorvastatin, glucose blood, multivitamin, multivitamin with minerals, and vitamin C  History Review  Allergy: Mr. Powley has No Known Allergies. Drug: Mr. Proch  reports previous drug use. Alcohol:  reports previous alcohol use. Tobacco:  reports that he quit smoking about 2 years ago. He has never used smokeless tobacco. Social: Mr. Siddoway  reports that he quit smoking about 2 years ago. He has never used smokeless tobacco. He reports previous alcohol use. He reports previous drug use. Medical:  has a past medical history of Diabetes mellitus without complication ([place]) and Sick sinus syndrome ([place]). Surgical: Mr. Depaul  has a past surgical history that includes PACEMAKER LEADLESS INSERTION (N/A, 08/28/2019) and TEMPORARY PACEMAKER (Right, 08/28/2019). Family: family history includes Arthritis in his mother; Diabetes in his father; Kidney disease in his father.  Laboratory Chemistry Profile   Renal Lab Results  Component Value Date   BUN 19 08/29/2019   CREATININE 1.19 08/29/2019   BCR 10 04/23/2018   GFRAA >60 08/29/2019   GFRNONAA >60 08/29/2019     Hepatic Lab Results  Component Value Date   AST 31 08/09/2019   ALT 43 08/09/2019   ALBUMIN 4.3 08/09/2019   ALKPHOS 56 08/09/2019     Electrolytes Lab Results  Component Value Date   NA 137 08/29/2019   K 4.7 08/29/2019   CL 103 08/29/2019   CALCIUM 9.0 08/29/2019   MG 2.2 08/10/2019   PHOS 3.8 08/10/2019     Bone No results  found for: VD25OH, VD125OH2TOT, DJ4970YO3, ZC5885OY7, 25OHVITD1, 25OHVITD2, 25OHVITD3, TESTOFREE, TESTOSTERONE   Inflammation (CRP: Acute Phase) (ESR: Chronic Phase) No results found for: CRP, ESRSEDRATE, LATICACIDVEN     Note: Above Lab results reviewed.    Physical Exam  General appearance: Well nourished, well developed, and well hydrated. In no apparent acute distress Mental status: Alert, oriented x 3 (person, place, & time)       Respiratory: No evidence of acute respiratory distress Eyes: PERLA Vitals: BP (!) 144/86   Pulse 77   Temp (!) 97.2 F (36.2 C) (Temporal)   Resp 14   Ht _0  (1.803 m)   Wt 179 lb (81.2 kg)   SpO2 100%   BMI 24.97 kg/m  BMI: Estimated body mass index is 24.97 kg/m as calculated from the following:   Height as of this encounter: _1  (1.803 m).   Weight as of this encounter: 179 lb (81.2 kg). Ideal: Ideal body weight: 75.3 kg (166 lb 0.1 oz) Adjusted ideal body weight: 77.7 kg (171 lb 3.3 oz)   Cervical Spine Area Exam  Skin & Axial Inspection: No masses, redness, edema, swelling, or associated skin lesions Alignment: Symmetrical Functional ROM: Pain restricted ROM      Stability: No instability detected Muscle Tone/Strength: Functionally intact. No obvious neuro-muscular anomalies detected. Sensory (Neurological): Musculoskeletal pain pattern Palpation: No palpable anomalies             Lumbar Spine Area Exam  Skin & Axial Inspection: No masses, redness, or swelling Alignment: Symmetrical Functional ROM: Pain restricted ROM       Stability: No instability detected Muscle Tone/Strength: Functionally intact. No obvious neuro-muscular anomalies detected. Sensory (Neurological): Musculoskeletal pain pattern  Gait & Posture Assessment  Ambulation: Unassisted Gait: Relatively normal for age and body habitus Posture: WNL    Assessment   Status Diagnosis  Persistent Persistent Persistent 1. Cervical radiculopathy   2. Cervical  facet joint syndrome   3. Musculoskeletal pain   4. Lumbar spondylosis   5. Lumbar facet joint pain   6. Lumbar degenerative disc  disease   7. Pharmacologic therapy   8. Localized osteoarthrosis of left hip   9. Chronic pain syndrome      Updated Problems: Problem  Lumbar Spondylosis  Lumbar Facet Joint Pain  Cervical Facet Joint Syndrome  Chronic Pain Syndrome  Lumbar Degenerative Disc Disease  Chronic Right Shoulder Pain  Localized Osteoarthrosis of Left Hip  Foot Arch Pain    Plan of Care  Problem-specific:  Chronic pain syndrome Inadequate analgesia with tramadol 50 mg twice daily as needed prescribed by PCP.  We will transition to hydrocodone 7.5 mg twice daily as needed to help manage his neck pain related to cervical radiculopathy, cervical facet joint syndrome as well as left hip osteoarthritis causing low back and left hip pain.  Mr. Sameul Tagle has a current medication list which includes the following long-term medication(s): atorvastatin and ventolin hfa.  Pharmacotherapy (Medications Ordered): Meds ordered this encounter  Medications  . HYDROcodone-acetaminophen (NORCO) 7.5-325 MG tablet    Sig: Take 1 tablet by mouth 2 (two) times daily as needed for severe pain. Must last 30 days.    Dispense:  60 tablet    Refill:  0    Chronic Pain. (STOP Act - Not applicable). Fill one day early if closed on scheduled refill date.   Orders:  Orders Placed This Encounter  Procedures  . ToxASSURE Select 13 (MW), Urine    Volume: 30 ml(s). Minimum 3 ml of urine is needed. Document temperature of fresh sample. Indications: Long term (current) use of opiate analgesic (Q94.765)    Order Specific Question:   Release to patient    Answer:   Immediate   Follow-up plan:   Return if symptoms worsen or fail to improve.   Recent Visits No visits were found meeting these conditions. Showing recent visits within past 90 days and meeting all other requirements Today's  Visits Date Type Provider Dept  06/08/20 Office Visit Gillis Santa, MD [facility]  Showing today's visits and meeting all other requirements Future Appointments No visits were found meeting these conditions. Showing future appointments within next 90 days and meeting all other requirements  I discussed the assessment and treatment plan with the patient. The patient was provided an opportunity to ask questions and all were answered. The patient agreed with the plan and demonstrated an understanding of the instructions.  Patient advised to call back or seek an in-person evaluation if the symptoms or condition worsens.  Duration of encounter: 30 minutes.  Note by: Gillis Santa, MD Date: 06/08/2020; Time: 8:38 AM

## 2020-06-08 NOTE — Patient Instructions (Signed)
One prescription for Hydrocodone has been sent to your pharmacy. 

## 2020-06-11 LAB — TOXASSURE SELECT 13 (MW), URINE

## 2020-08-09 ENCOUNTER — Encounter: Payer: Medicare Other | Admitting: Student in an Organized Health Care Education/Training Program

## 2020-08-10 ENCOUNTER — Ambulatory Visit
Payer: Medicare Other | Attending: Student in an Organized Health Care Education/Training Program | Admitting: Student in an Organized Health Care Education/Training Program

## 2020-08-10 ENCOUNTER — Other Ambulatory Visit: Payer: Self-pay

## 2020-08-10 ENCOUNTER — Encounter: Payer: Self-pay | Admitting: Student in an Organized Health Care Education/Training Program

## 2020-08-10 VITALS — BP 161/84 | HR 81 | Temp 97.0°F | Resp 16 | Ht 71.0 in | Wt 182.0 lb

## 2020-08-10 DIAGNOSIS — M1612 Unilateral primary osteoarthritis, left hip: Secondary | ICD-10-CM

## 2020-08-10 DIAGNOSIS — M47812 Spondylosis without myelopathy or radiculopathy, cervical region: Secondary | ICD-10-CM | POA: Diagnosis present

## 2020-08-10 DIAGNOSIS — M7918 Myalgia, other site: Secondary | ICD-10-CM

## 2020-08-10 DIAGNOSIS — Z79899 Other long term (current) drug therapy: Secondary | ICD-10-CM

## 2020-08-10 DIAGNOSIS — M47816 Spondylosis without myelopathy or radiculopathy, lumbar region: Secondary | ICD-10-CM | POA: Diagnosis present

## 2020-08-10 DIAGNOSIS — M545 Low back pain: Secondary | ICD-10-CM | POA: Diagnosis present

## 2020-08-10 DIAGNOSIS — M5459 Other low back pain: Secondary | ICD-10-CM

## 2020-08-10 DIAGNOSIS — M5412 Radiculopathy, cervical region: Secondary | ICD-10-CM | POA: Diagnosis present

## 2020-08-10 DIAGNOSIS — M51369 Other intervertebral disc degeneration, lumbar region without mention of lumbar back pain or lower extremity pain: Secondary | ICD-10-CM

## 2020-08-10 DIAGNOSIS — M5136 Other intervertebral disc degeneration, lumbar region: Secondary | ICD-10-CM | POA: Diagnosis present

## 2020-08-10 MED ORDER — HYDROCODONE-ACETAMINOPHEN 7.5-325 MG PO TABS: 1.0000 | ORAL_TABLET | Freq: Two times a day (BID) | ORAL | 0 refills | Status: AC | PRN

## 2020-08-10 NOTE — Progress Notes (Signed)
Nursing Pain Medication Assessment:  Safety precautions to be maintained throughout the outpatient stay will include: orient to surroundings, keep bed in low position, maintain call bell within reach at all times, provide assistance with transfer out of bed and ambulation.  Medication Inspection Compliance: Andres Torres did not comply with our request to bring his pills to be counted. He was reminded that bringing the medication bottles, even when empty, is a requirement.  Medication: None brought in. Pill/Patch Count: None available to be counted. Bottle Appearance: No container available. Did not bring bottle(s) to appointment. Filled Date: N/A Last Medication intake:  Today

## 2020-08-10 NOTE — Progress Notes (Signed)
PROVIDER NOTE: Information contained herein reflects review and annotations entered in association with encounter. Interpretation of such information and data should be left to medically-trained personnel. Information provided to patient can be located elsewhere in the medical record under "Patient Instructions". Document created using STT-dictation technology, any transcriptional errors that may result from process are unintentional.    Patient: Andres Torres  Service Category: E/M  Provider: Gillis Santa, MD  DOB: 1951-03-08  DOS: 08/10/2020  Specialty: Interventional Pain Management  MRN: [id number]  Setting: Ambulatory outpatient  PCP: Freddy Finner, NP  Type: Established Patient    Referring Provider: Freddy Finner, NP  Location: Office  Delivery: Face-to-face     HPI  Reason for encounter: Andres Torres, a 69 y.o. year old male, is here today for evaluation and management of his Cervical radiculopathy [M54.12]. [name] primary complain today is Back Pain (lower), Neck Pain, and Hip Pain (left) Last encounter: Practice (08/09/2020). My last encounter with him was on 08/09/2020. Pertinent problems: [name] has Localized osteoarthrosis of left hip; Foot arch pain; Chronic pain syndrome; Lumbar degenerative disc disease; Chronic right shoulder pain; Lumbar spondylosis; Lumbar facet joint pain; and Cervical facet joint syndrome on their pertinent problem list. Pain Assessment: Severity of Chronic pain is reported as a 8 /10. Location: Back Lower/denies. Onset: More than a month ago. Quality: Sharp. Timing: Constant. Modifying factor(s): medications, hydrotherapy. Vitals:  height is 5' 11" (1.803 m) and weight is 182 lb (82.6 kg). His temporal temperature is 97 F (36.1 C) (abnormal). His blood pressure is 161/84 (abnormal) and his pulse is 81. His respiration is 16 and oxygen saturation is 100%.   Follows up today for medication management.  Is finding better pain relief with hydrocodone 7.5  mg twice daily as needed.  He does not take this medication on a daily basis.  It is dependent upon his activity level and his associated pain.  His last fill was on 06/08/2020.  We will refill as below.  No change in dose.  Patient is also having left hip pain related to the hip osteoarthritis.  Has found benefit with previous left hip intra-articular injection.  Would like for this to be available for him.  We will place as needed order.  Pharmacotherapy Assessment   06/08/2020  1   06/08/2020  Hydrocodone-Acetamin 7.5-325  60.00  30 Un Pha   [id number]   Cha ([id number])   0/0  15.00 MME  Comm Ins   [place]      Analgesic: Norco 7.5 mg twice daily as needed, quantity 60/month  Monitoring: [place] PMP: PDMP reviewed during this encounter.       Pharmacotherapy: No side-effects or adverse reactions reported. Compliance: No problems identified. Effectiveness: Clinically acceptable.  [name], RN  08/10/2020  9:40 AM  Sign when Signing Visit Nursing Pain Medication Assessment:  Safety precautions to be maintained throughout the outpatient stay will include: orient to surroundings, keep bed in low position, maintain call bell within reach at all times, provide assistance with transfer out of bed and ambulation.  Medication Inspection Compliance: Mr. Bun did not comply with our request to bring his pills to be counted. He was reminded that bringing the medication bottles, even when empty, is a requirement.  Medication: None brought in. Pill/Patch Count: None available to be counted. Bottle Appearance: No container available. Did not bring bottle(s) to appointment. Filled Date: N/A Last Medication intake:  Today    UDS:  Summary  Date Value Ref Range  Status  06/08/2020 Note  Final    Comment:    ==================================================================== [employer] Select 13 (MW) ==================================================================== Test                              Result       Flag       Units  Drug Present not Declared for Prescription Verification   Carboxy-THC                    25           UNEXPECTED ng/mg creat    Carboxy-THC is a metabolite of tetrahydrocannabinol (THC). Source of    THC is most commonly herbal marijuana or marijuana-based products,    but THC is also present in a scheduled prescription medication.    Trace amounts of THC can be present in hemp and cannabidiol (CBD)    products. This test is not intended to distinguish between delta-9-    tetrahydrocannabinol, the predominant form of THC in most herbal or    marijuana-based products, and delta-8-tetrahydrocannabinol.  Drug Absent but Declared for Prescription Verification   Hydrocodone                    Not Detected UNEXPECTED ng/mg creat ==================================================================== Test                      Result    Flag   Units      Ref Range   Creatinine              152              mg/dL      >=20 ==================================================================== Declared Medications:  The flagging and interpretation on this report are based on the  following declared medications.  Unexpected results may arise from  inaccuracies in the declared medications.   **Note: The testing scope of this panel includes these medications:   Hydrocodone (Norco)   **Note: The testing scope of this panel does not include the  following reported medications:   Acetaminophen (Norco)  Albuterol  Atorvastatin  Multivitamin  Ubiquinone (CoQ10)  Vitamin C ==================================================================== For clinical consultation, please call [phone]. ====================================================================      ROS  Constitutional: Denies any fever or chills Gastrointestinal: No reported hemesis, hematochezia, vomiting, or acute GI distress Musculoskeletal: Left hip pain Neurological: No reported episodes of  acute onset apraxia, aphasia, dysarthria, agnosia, amnesia, paralysis, loss of coordination, or loss of consciousness  Medication Review  CoQ10, HYDROcodone-acetaminophen, albuterol, atorvastatin, glucose blood, multivitamin, multivitamin with minerals, and vitamin C  History Review  Allergy: Mr. Bossi has No Known Allergies. Drug: Mr. Vandervliet  reports previous drug use. Alcohol:  reports previous alcohol use. Tobacco:  reports that he quit smoking about 2 years ago. He has never used smokeless tobacco. Social: Mr. Bitting  reports that he quit smoking about 2 years ago. He has never used smokeless tobacco. He reports previous alcohol use. He reports previous drug use. Medical:  has a past medical history of Diabetes mellitus without complication ([place]) and Sick sinus syndrome ([place]). Surgical: Mr. Lopes  has a past surgical history that includes PACEMAKER LEADLESS INSERTION (N/A, 08/28/2019) and TEMPORARY PACEMAKER (Right, 08/28/2019). Family: family history includes Arthritis in his mother; Diabetes in his father; Kidney disease in his father.  Laboratory Chemistry Profile   Renal Lab Results  Component Value Date   BUN  19 08/29/2019  ° CREATININE 1.19 08/29/2019  ° BCR 10 04/23/2018  ° GFRAA >60 08/29/2019  ° GFRNONAA >60 08/29/2019  ° °  Hepatic °Lab Results  °Component Value Date  ° AST 31 08/09/2019  ° ALT 43 08/09/2019  ° ALBUMIN 4.3 08/09/2019  ° ALKPHOS 56 08/09/2019  ° °  °Electrolytes °Lab Results  °Component Value Date  ° NA 137 08/29/2019  ° K 4.7 08/29/2019  ° CL 103 08/29/2019  ° CALCIUM 9.0 08/29/2019  ° MG 2.2 08/10/2019  ° PHOS 3.8 08/10/2019  ° °  Bone °No results found for: VD25OH, VD125OH2TOT, VD3125OH2, VD2125OH2, 25OHVITD1, 25OHVITD2, 25OHVITD3, TESTOFREE, TESTOSTERONE °  °Inflammation (CRP: Acute Phase) (ESR: Chronic Phase) °No results found for: CRP, ESRSEDRATE, LATICACIDVEN °    °Note: Above Lab results reviewed. 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 In no apparent acute distress °Mental status: Alert, oriented x 3 (person, place, & time)       °Respiratory: No evidence of acute respiratory distress °Eyes: PERLA °Vitals: BP (!) 161/84    Pulse 81    Temp (!) 97 °F (36.1 °C) (Temporal)    Resp 16    Ht 5' 11" (1.803 m)    Wt 182 lb (82.6 kg)    SpO2 100%    BMI 25.38 kg/m²  °BMI: Estimated body mass index is 25.38 kg/m² as calculated from the following: °  Height as of this encounter: 5' 11" (1.803 m). °  Weight as of this encounter: 182 lb (82.6 kg). °Ideal: Ideal body weight: 75.3 kg (166 lb 0.1 oz) °Adjusted ideal body weight: 78.2 kg (172 lb 6.5 oz) ° °Lumbar Spine Area Exam  °Skin & Axial Inspection: No masses, redness, or swelling °Alignment: Symmetrical °Functional ROM: Pain restricted ROM       °Stability: No instability detected °Muscle Tone/Strength: Functionally intact. No obvious neuro-muscular anomalies detected. °Sensory (Neurological): Myotome pain pattern ° °Gait & Posture Assessment  °Ambulation: Limited °Gait: Antalgic °Posture: WNL  °Lower Extremity Exam    °Side: Right lower extremity  Side: Left lower extremity  °Stability: No instability observed          Stability: No instability observed          °Skin & Extremity Inspection: Skin color, temperature, and hair growth are WNL. No peripheral edema or cyanosis. No masses, redness, swelling, asymmetry, or associated skin lesions. No contractures.  Skin & Extremity Inspection: Skin color, temperature, and hair growth are WNL. No peripheral edema or cyanosis. No masses, redness, swelling, asymmetry, or associated skin lesions. No contractures.  °Functional ROM: Unrestricted ROM         °         Functional ROM: Pain restricted ROM for hip and knee joints °         °Muscle Tone/Strength: Functionally intact. No  obvious neuro-muscular anomalies detected.  Muscle Tone/Strength: Functionally intact. No obvious neuro-muscular anomalies detected.  °Sensory (Neurological): Unimpaired        Sensory (Neurological): Arthropathic arthralgia left hip        °DTR: °Patellar: deferred today °Achilles: deferred today °Plantar: deferred today  DTR: °Patellar: deferred today °Achilles: deferred today °Plantar: deferred today  °Palpation: No palpable anomalies  Palpation: No palpable anomalies  ° ° °Assessment  ° °Status Diagnosis  °Controlled °Controlled °Controlled 1.   Cervical radiculopathy   2. Cervical facet joint syndrome   3. Musculoskeletal pain   4. Lumbar spondylosis   5. Lumbar facet joint pain   6. Lumbar degenerative disc disease   7. Pharmacologic therapy   8. Localized osteoarthrosis of left hip       Plan of Care  Mr. Bruce Churilla has a current medication list which includes the following long-term medication(s): atorvastatin and ventolin hfa.  Pharmacotherapy (Medications Ordered): Meds ordered this encounter  Medications   HYDROcodone-acetaminophen (NORCO) 7.5-325 MG tablet    Sig: Take 1 tablet by mouth 2 (two) times daily as needed for moderate pain.    Dispense:  60 tablet    Refill:  0   HYDROcodone-acetaminophen (NORCO) 7.5-325 MG tablet    Sig: Take 1 tablet by mouth 2 (two) times daily as needed for moderate pain.    Dispense:  60 tablet    Refill:  0   Orders:  Orders Placed This Encounter  Procedures   HIP INJECTION    For hip pain.    Standing Status:   Standing    Number of Occurrences:   1    Standing Expiration Date:   08/10/2021    Scheduling Instructions:     Side: Left     Sedation: Patient's choice.     TIMEFRAME: PRN procedure. (Mr. Godown will call when needed.)   Follow-up plan:   Return in about 4 months (around 12/10/2020) for Medication Management, in person.   Recent Visits Date Type Provider Dept  08/09/20 Appointment Gillis Santa, MD [facility]  06/08/20 Office Visit Gillis Santa, MD [facility]  Showing recent visits within past 90 days and meeting all other requirements Today's Visits Date Type Provider Dept  08/10/20 Office Visit Gillis Santa, MD [facility]  Showing today's visits and meeting all other requirements Future Appointments No visits were found meeting these conditions. Showing future appointments within next 90 days and meeting all other requirements  I discussed the assessment and treatment plan with the patient. The patient was provided an opportunity to ask questions and all were answered. The patient agreed with the plan and demonstrated an understanding of the instructions.  Patient advised to call back or seek an in-person evaluation if the symptoms or condition worsens.  Duration of encounter: 30 minutes.  Note by: Gillis Santa, MD Date: 08/10/2020; Time: 10:19 AM

## 2020-08-10 NOTE — Patient Instructions (Signed)

## 2020-11-15 ENCOUNTER — Telehealth: Payer: Self-pay | Admitting: Student in an Organized Health Care Education/Training Program

## 2020-11-15 NOTE — Telephone Encounter (Signed)
Per Dr. Cherylann Ratel: NO VIRTUAL VISIT. Must have in person visit. Patient called and states will keep 12/09/2020 visit in person.

## 2020-11-15 NOTE — Telephone Encounter (Signed)
Patient needs refill on Hydrocodone and is under Covid quarintine. His appt for med refill is 12-09-20. Can he get 1 refill sent in until his appt ?

## 2020-12-09 ENCOUNTER — Telehealth: Payer: Self-pay | Admitting: Student in an Organized Health Care Education/Training Program

## 2020-12-09 ENCOUNTER — Encounter: Payer: Self-pay | Admitting: Student in an Organized Health Care Education/Training Program

## 2020-12-09 ENCOUNTER — Other Ambulatory Visit: Payer: Self-pay

## 2020-12-09 ENCOUNTER — Ambulatory Visit
Payer: Medicare Other | Attending: Student in an Organized Health Care Education/Training Program | Admitting: Student in an Organized Health Care Education/Training Program

## 2020-12-09 VITALS — BP 150/82 | HR 75 | Temp 97.0°F | Resp 18 | Ht 71.0 in | Wt 180.6 lb

## 2020-12-09 DIAGNOSIS — M47816 Spondylosis without myelopathy or radiculopathy, lumbar region: Secondary | ICD-10-CM | POA: Insufficient documentation

## 2020-12-09 DIAGNOSIS — M5412 Radiculopathy, cervical region: Secondary | ICD-10-CM | POA: Diagnosis present

## 2020-12-09 DIAGNOSIS — G894 Chronic pain syndrome: Secondary | ICD-10-CM | POA: Diagnosis present

## 2020-12-09 DIAGNOSIS — M5136 Other intervertebral disc degeneration, lumbar region: Secondary | ICD-10-CM | POA: Diagnosis present

## 2020-12-09 DIAGNOSIS — M7918 Myalgia, other site: Secondary | ICD-10-CM | POA: Insufficient documentation

## 2020-12-09 DIAGNOSIS — M5459 Other low back pain: Secondary | ICD-10-CM | POA: Diagnosis present

## 2020-12-09 DIAGNOSIS — M47812 Spondylosis without myelopathy or radiculopathy, cervical region: Secondary | ICD-10-CM | POA: Diagnosis present

## 2020-12-09 MED ORDER — HYDROCODONE-ACETAMINOPHEN 7.5-325 MG PO TABS: 1.0000 | ORAL_TABLET | Freq: Two times a day (BID) | ORAL | 0 refills | Status: AC | PRN

## 2020-12-09 NOTE — Telephone Encounter (Signed)
This may have already been taken care of... I found note laying on my desk.  Beth from patients pharmacy called and has questions about his script sent in on Thursday. Please check that this is taken care of. Thank you.

## 2020-12-09 NOTE — Progress Notes (Signed)
Nursing Pain Medication Assessment:  Safety precautions to be maintained throughout the outpatient stay will include: orient to surroundings, keep bed in low position, maintain call bell within reach at all times, provide assistance with transfer out of bed and ambulation.  Medication Inspection Compliance: Andres Torres did not comply with our request to bring his pills to be counted. He was reminded that bringing the medication bottles, even when empty, is a requirement.  Medication: None brought in. Pill/Patch Count: None available to be counted. Bottle Appearance: No container available. Did not bring bottle(s) to appointment. Filled Date: N/A Last Medication intake:  Ran out of medicine more than 48 hours ago   Pt states he has not had any since 10/2020

## 2020-12-09 NOTE — Progress Notes (Signed)
PROVIDER NOTE: Information contained herein reflects review and annotations entered in association with encounter. Interpretation of such information and data should be left to medically-trained personnel. Information provided to patient can be located elsewhere in the medical record under "Patient Instructions". Document created using STT-dictation technology, any transcriptional errors that may result from process are unintentional.    Patient: Andres Torres  Service Category: E/M  Provider: Gillis Santa, MD  DOB: January 05, 1951  DOS: 12/09/2020  Specialty: Interventional Pain Management  MRN: [id number]  Setting: Ambulatory outpatient  PCP: Andres Finner, NP  Type: Established Patient    Referring Provider: Freddy Finner, NP  Location: Office  Delivery: Face-to-face     HPI  [name], a 70 y.o. year old male, is here today because of his Cervical radiculopathy [M54.12]. [name] primary complain today is Hip Pain (left), Back Pain (lower), and Neck Pain Last encounter: My last encounter with him was on 11/15/2020. Pertinent problems: [name] has Localized osteoarthrosis of left hip; Foot arch pain; Chronic pain syndrome; Lumbar degenerative disc disease; Chronic right shoulder pain; Lumbar spondylosis; Lumbar facet joint pain; and Cervical facet joint syndrome on their pertinent problem list. Pain Assessment: Severity of Chronic pain is reported as a 8 /10. Location: Hip Left/lower back. Onset: More than a month ago. Quality: Aching,Sharp (aching OR sharp depending on activitiy; intensity varies throught the day). Timing: Constant. Modifying factor(s): meds. Vitals:  height is '5\' 11"'  (1.803 m) and weight is 180 lb 9.6 oz (81.9 kg). His temporal temperature is 97 F (36.1 C) (abnormal). His blood pressure is 150/82 (abnormal) and his pulse is 75. His respiration is 18 and oxygen saturation is 93%.   Reason for encounter: medication management.    No change in medical history since last  visit.  Patient's pain is at baseline.  Patient continues pain regimen as prescribed.  States that it provides pain relief and improvement in functional status. Patient takes his hydrocodone only as needed.  Last fill was on November 15.  States that in the winter months, his musculoskeletal pain specifically in his lower back is more pronounced.  Pharmacotherapy Assessment   09/27/2020  08/10/2020   1  Hydrocodone-Acetamin 7.5-325  60.00  30  Un Pha  [id number]  Cha ([id number])  0/0  15.00 MME  Comm Ins  [place]    Analgesic: Norco 7.5 mg twice daily as needed, quantity 60/month   Monitoring: [place] PMP: PDMP reviewed during this encounter.       Pharmacotherapy: No side-effects or adverse reactions reported. Compliance: No problems identified. Effectiveness: Clinically acceptable.  Rise Patience, RN  12/09/2020  9:52 AM  Sign when Signing Visit Nursing Pain Medication Assessment:  Safety precautions to be maintained throughout the outpatient stay will include: orient to surroundings, keep bed in low position, maintain call bell within reach at all times, provide assistance with transfer out of bed and ambulation.  Medication Inspection Compliance: [name] did not comply with our request to bring his pills to be counted. He was reminded that bringing the medication bottles, even when empty, is a requirement.  Medication: None brought in. Pill/Patch Count: None available to be counted. Bottle Appearance: No container available. Did not bring bottle(s) to appointment. Filled Date: N/A Last Medication intake:  Ran out of medicine more than 48 hours ago   Pt states he has not had any since 10/2020    UDS:  Summary  Date Value Ref Range Status  06/08/2020 Note  Final  Comment:    ==================================================================== [employer] Select 13 (MW) ==================================================================== Test                             Result       Flag        Units  Drug Present not Declared for Prescription Verification   Carboxy-THC                    25           UNEXPECTED ng/mg creat    Carboxy-THC is a metabolite of tetrahydrocannabinol (THC). Source of    THC is most commonly herbal marijuana or marijuana-based products,    but THC is also present in a scheduled prescription medication.    Trace amounts of THC can be present in hemp and cannabidiol (CBD)    products. This test is not intended to distinguish between delta-9-    tetrahydrocannabinol, the predominant form of THC in most herbal or    marijuana-based products, and delta-8-tetrahydrocannabinol.  Drug Absent but Declared for Prescription Verification   Hydrocodone                    Not Detected UNEXPECTED ng/mg creat ==================================================================== Test                      Result    Flag   Units      Ref Range   Creatinine              152              mg/dL      >=20 ==================================================================== Declared Medications:  The flagging and interpretation on this report are based on the  following declared medications.  Unexpected results may arise from  inaccuracies in the declared medications.   **Note: The testing scope of this panel includes these medications:   Hydrocodone (Norco)   **Note: The testing scope of this panel does not include the  following reported medications:   Acetaminophen (Norco)  Albuterol  Atorvastatin  Multivitamin  Ubiquinone (CoQ10)  Vitamin C ==================================================================== For clinical consultation, please call [phone]. ====================================================================      ROS  Constitutional: Denies any fever or chills Gastrointestinal: No reported hemesis, hematochezia, vomiting, or acute GI distress Musculoskeletal: Denies any acute onset joint swelling, redness, loss of ROM, or  weakness Neurological: No reported episodes of acute onset apraxia, aphasia, dysarthria, agnosia, amnesia, paralysis, loss of coordination, or loss of consciousness  Medication Review  CoQ10, HYDROcodone-acetaminophen, albuterol, atorvastatin, glucose blood, multivitamin with minerals, and vitamin C  History Review  Allergy: Mr. Kissinger has No Known Allergies. Drug: Mr. Kanady  reports previous drug use. Alcohol:  reports previous alcohol use. Tobacco:  reports that he quit smoking about 2 years ago. He has never used smokeless tobacco. Social: Mr. Wesche  reports that he quit smoking about 2 years ago. He has never used smokeless tobacco. He reports previous alcohol use. He reports previous drug use. Medical:  has a past medical history of Diabetes mellitus without complication ([place]) and Sick sinus syndrome ([place]). Surgical: Mr. Borawski  has a past surgical history that includes PACEMAKER LEADLESS INSERTION (N/A, 08/28/2019) and TEMPORARY PACEMAKER (Right, 08/28/2019). Family: family history includes Arthritis in his mother; Diabetes in his father; Kidney disease in his father.  Laboratory Chemistry Profile   Renal Lab Results  Component Value Date   BUN 19  08/29/2019   CREATININE 1.19 08/29/2019   BCR 10 04/23/2018   GFRAA >60 08/29/2019   GFRNONAA >60 08/29/2019     Hepatic Lab Results  Component Value Date   AST 31 08/09/2019   ALT 43 08/09/2019   ALBUMIN 4.3 08/09/2019   ALKPHOS 56 08/09/2019     Electrolytes Lab Results  Component Value Date   NA 137 08/29/2019   K 4.7 08/29/2019   CL 103 08/29/2019   CALCIUM 9.0 08/29/2019   MG 2.2 08/10/2019   PHOS 3.8 08/10/2019     Bone No results found for: VD25OH, VD125OH2TOT, HK7425ZD6, LO7564PP2, 25OHVITD1, 25OHVITD2, 25OHVITD3, TESTOFREE, TESTOSTERONE   Inflammation (CRP: Acute Phase) (ESR: Chronic Phase) No results found for: CRP, ESRSEDRATE, LATICACIDVEN     Note: Above Lab results reviewed.  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 In no apparent acute distress Mental status: Alert, oriented x 3 (person, place, & time)       Respiratory: No evidence of acute respiratory distress Eyes: PERLA Vitals: BP (!) 150/82   Pulse 75   Temp (!) 97 F (36.1 C) (Temporal)   Resp 18   Ht '5\' 11"'  (1.803 m)   Wt 180 lb 9.6 oz (81.9 kg)   SpO2 93%   BMI 25.19 kg/m  BMI: Estimated body mass index is 25.19 kg/m as calculated from the following:   Height as of this encounter: '5\' 11"'  (1.803 m).   Weight as of this encounter: 180 lb 9.6 oz (81.9 kg). Ideal: Ideal body weight: 75.3 kg (166 lb 0.1 oz) Adjusted ideal body weight: 77.9 kg (171 lb 13.5 oz)   Lumbar Spine Area Exam  Skin & Axial Inspection: No masses, redness, or swelling Alignment: Symmetrical Functional ROM: Pain restricted ROM       Stability: No instability detected Muscle Tone/Strength: Functionally intact. No obvious neuro-muscular anomalies detected. Sensory (Neurological): Myotome pain pattern  Gait & Posture Assessment  Ambulation: Limited Gait: Antalgic Posture: WNL  Lower Extremity Exam    Side: Right lower extremity  Side: Left lower extremity  Stability: No instability observed          Stability: No instability observed          Skin & Extremity Inspection: Skin color, temperature, and hair growth are WNL. No peripheral edema or cyanosis. No masses, redness, swelling, asymmetry, or associated skin lesions. No contractures.  Skin & Extremity Inspection: Skin color, temperature, and hair growth are WNL. No peripheral edema or cyanosis. No masses, redness, swelling, asymmetry, or associated skin lesions. No contractures.  Functional ROM: Unrestricted ROM                  Functional ROM: Pain restricted ROM for hip and knee joints           Muscle Tone/Strength: Functionally intact. No obvious neuro-muscular anomalies detected.  Muscle Tone/Strength: Functionally intact. No obvious neuro-muscular anomalies detected.  Sensory (Neurological): Unimpaired        Sensory (Neurological): Arthropathic arthralgia left hip        DTR: Patellar: deferred today Achilles: deferred today Plantar: deferred today  DTR: Patellar: deferred today Achilles: deferred today Plantar: deferred today  Palpation: No palpable anomalies  Palpation: No palpable anomalies      Assessment   Status Diagnosis  Controlled Controlled Controlled 1. Cervical  radiculopathy   2. Cervical facet joint syndrome   3. Musculoskeletal pain   4. Lumbar spondylosis   5. Lumbar facet joint pain   6. Lumbar degenerative disc disease   7. Chronic pain syndrome      Plan of Care  Mr. Joenathan Sakuma has a current medication list which includes the following long-term medication(s): atorvastatin and ventolin hfa.  Pharmacotherapy (Medications Ordered): Meds ordered this encounter  Medications  . HYDROcodone-acetaminophen (NORCO) 7.5-325 MG tablet    Sig: Take 1 tablet by mouth 2 (two) times daily as needed for moderate pain.    Dispense:  60 tablet    Refill:  0  . HYDROcodone-acetaminophen (NORCO) 7.5-325 MG tablet    Sig: Take 1 tablet by mouth 2 (two) times daily as needed for moderate pain.    Dispense:  60 tablet    Refill:  0    Follow-up plan:   Return in about 3 months (around 03/09/2021) for Medication Management, in person.     Recent Visits No visits were found meeting these conditions. Showing recent visits within past 90 days and meeting all other requirements Today's Visits Date Type Provider Dept  12/09/20 Office Visit Gillis Santa, MD [facility]  Showing today's visits and meeting all other requirements Future Appointments No visits were found meeting these conditions. Showing future appointments within next 90  days and meeting all other requirements  I discussed the assessment and treatment plan with the patient. The patient was provided an opportunity to ask questions and all were answered. The patient agreed with the plan and demonstrated an understanding of the instructions.  Patient advised to call back or seek an in-person evaluation if the symptoms or condition worsens.  Duration of encounter:30 minutes.  Note by: Gillis Santa, MD Date: 12/09/2020; Time: 10:11 AM

## 2020-12-10 NOTE — Telephone Encounter (Signed)
Left message at [facility] for [facility] to return our call regarding her questions.

## 2021-03-08 ENCOUNTER — Other Ambulatory Visit: Payer: Self-pay

## 2021-03-08 ENCOUNTER — Encounter: Payer: Self-pay | Admitting: Student in an Organized Health Care Education/Training Program

## 2021-03-08 ENCOUNTER — Ambulatory Visit
Payer: Medicare Other | Attending: Student in an Organized Health Care Education/Training Program | Admitting: Student in an Organized Health Care Education/Training Program

## 2021-03-08 VITALS — BP 153/90 | HR 74 | Temp 96.6°F | Resp 16 | Ht 71.0 in | Wt 180.0 lb

## 2021-03-08 DIAGNOSIS — M5412 Radiculopathy, cervical region: Secondary | ICD-10-CM | POA: Insufficient documentation

## 2021-03-08 DIAGNOSIS — M47812 Spondylosis without myelopathy or radiculopathy, cervical region: Secondary | ICD-10-CM | POA: Diagnosis present

## 2021-03-08 DIAGNOSIS — M47816 Spondylosis without myelopathy or radiculopathy, lumbar region: Secondary | ICD-10-CM | POA: Diagnosis present

## 2021-03-08 DIAGNOSIS — G894 Chronic pain syndrome: Secondary | ICD-10-CM | POA: Insufficient documentation

## 2021-03-08 DIAGNOSIS — G8929 Other chronic pain: Secondary | ICD-10-CM | POA: Insufficient documentation

## 2021-03-08 DIAGNOSIS — M5459 Other low back pain: Secondary | ICD-10-CM | POA: Insufficient documentation

## 2021-03-08 DIAGNOSIS — M1612 Unilateral primary osteoarthritis, left hip: Secondary | ICD-10-CM | POA: Diagnosis present

## 2021-03-08 DIAGNOSIS — M1712 Unilateral primary osteoarthritis, left knee: Secondary | ICD-10-CM | POA: Insufficient documentation

## 2021-03-08 DIAGNOSIS — M25562 Pain in left knee: Secondary | ICD-10-CM | POA: Insufficient documentation

## 2021-03-08 MED ORDER — HYDROCODONE-ACETAMINOPHEN 7.5-325 MG PO TABS: 1.0000 | ORAL_TABLET | Freq: Two times a day (BID) | ORAL | 0 refills | Status: AC | PRN

## 2021-03-08 NOTE — Progress Notes (Signed)
Nursing Pain Medication Assessment:  Safety precautions to be maintained throughout the outpatient stay will include: orient to surroundings, keep bed in low position, maintain call bell within reach at all times, provide assistance with transfer out of bed and ambulation.  Medication Inspection Compliance: Pill count conducted under aseptic conditions, in front of the patient. Neither the pills nor the bottle was removed from the patient's sight at any time. Once count was completed pills were immediately returned to the patient in their original bottle.  Medication: Hydrocodone/APAP Pill/Patch Count: 0 of 60 pills remain Pill/Patch Appearance: Markings consistent with prescribed medication Bottle Appearance: Standard pharmacy container. Clearly labeled. Filled Date: 03 / 03 / 22 Last Medication intake:  Ran out of medicine more than 48 hours ago

## 2021-03-08 NOTE — Progress Notes (Signed)
PROVIDER NOTE: Information contained herein reflects review and annotations entered in association with encounter. Interpretation of such information and data should be left to medically-trained personnel. Information provided to patient can be located elsewhere in the medical record under "Patient Instructions". Document created using STT-dictation technology, any transcriptional errors that may result from process are unintentional.    Patient: Andres Torres  Service Category: E/M  Provider: Gillis Santa, MD  DOB: 10-28-1951  DOS: 03/08/2021  Specialty: Interventional Pain Management  MRN: [id number]  Setting: Ambulatory outpatient  PCP: Andres Finner, NP  Type: Established Patient    Referring Provider: Freddy Finner, NP  Location: Office  Delivery: Face-to-face     HPI  Andres Torres, a 70 y.o. year old male, is here today because of his Chronic pain of left knee [M25.562, G89.29]. Andres Torres primary complain today is Back Pain (To left hip), Neck Pain, and Foot Pain (Bilat, alternately; "neuropathy") Last encounter: My last encounter with him was on 12/09/2020. Pertinent problems: Andres Torres has Localized osteoarthrosis of left hip; Foot arch pain; Chronic pain syndrome; Lumbar degenerative disc disease; Chronic right shoulder pain; Lumbar spondylosis; Lumbar facet joint pain; and Cervical facet joint syndrome on their pertinent problem list. Pain Assessment: Severity of Chronic pain is reported as a 9 /10. Location: Back Left/to left hip; Also c/o alternating R/L foot neuropathy, neck pain and left knee pain. Onset: More than a month ago. Quality: Aching,Tingling. Timing: Constant. Modifying factor(s): meds. Vitals:  height is _0  (1.803 m) and weight is 180 lb (81.6 kg). His temporal temperature is 96.6 F (35.9 C) (abnormal). His blood pressure is 153/90 (abnormal) and his pulse is 74. His respiration is 16 and oxygen saturation is 100%.   Reason for encounter: medication management.     Patient presents today for medication management as well as increased left knee pain that is worse with weightbearing.  He does have left knee osteoarthritis.  I have offered him a left knee steroid injection which he states he will go through with if his knee pain worsens.  Otherwise I will refill his hydrocodone as below.  We discussed rest ice compression elevation of his left knee especially on days when he is more active.  Pharmacotherapy Assessment   Analgesic: Norco 7.5 mg twice daily as needed, quantity 60/month   Monitoring: [place] PMP: PDMP reviewed during this encounter.       Pharmacotherapy: No side-effects or adverse reactions reported. Compliance: No problems identified. Effectiveness: Clinically acceptable.  Andres Patience, RN  03/08/2021 10:53 AM  Sign when Signing Visit Nursing Pain Medication Assessment:  Safety precautions to be maintained throughout the outpatient stay will include: orient to surroundings, keep bed in low position, maintain call bell within reach at all times, provide assistance with transfer out of bed and ambulation.  Medication Inspection Compliance: Pill count conducted under aseptic conditions, in front of the patient. Neither the pills nor the bottle was removed from the patient's sight at any time. Once count was completed pills were immediately returned to the patient in their original bottle.  Medication: Hydrocodone/APAP Pill/Patch Count: 0 of 60 pills remain Pill/Patch Appearance: Markings consistent with prescribed medication Bottle Appearance: Standard pharmacy container. Clearly labeled. Filled Date: 03 / 03 / 22 Last Medication intake:  Ran out of medicine more than 48 hours ago    UDS:  Summary  Date Value Ref Range Status  06/08/2020 Note  Final    Comment:    ==================================================================== [employer] Select 13 (MW) ====================================================================  Test                              Result       Flag       Units  Drug Present not Declared for Prescription Verification   Carboxy-THC                    25           UNEXPECTED ng/mg creat    Carboxy-THC is a metabolite of tetrahydrocannabinol (THC). Source of    THC is most commonly herbal marijuana or marijuana-based products,    but THC is also present in a scheduled prescription medication.    Trace amounts of THC can be present in hemp and cannabidiol (CBD)    products. This test is not intended to distinguish between delta-9-    tetrahydrocannabinol, the predominant form of THC in most herbal or    marijuana-based products, and delta-8-tetrahydrocannabinol.  Drug Absent but Declared for Prescription Verification   Hydrocodone                    Not Detected UNEXPECTED ng/mg creat ==================================================================== Test                      Result    Flag   Units      Ref Range   Creatinine              152              mg/dL      >=20 ==================================================================== Declared Medications:  The flagging and interpretation on this report are based on the  following declared medications.  Unexpected results may arise from  inaccuracies in the declared medications.   **Note: The testing scope of this panel includes these medications:   Hydrocodone (Norco)   **Note: The testing scope of this panel does not include the  following reported medications:   Acetaminophen (Norco)  Albuterol  Atorvastatin  Multivitamin  Ubiquinone (CoQ10)  Vitamin C ==================================================================== For clinical consultation, please call [phone]. ====================================================================      ROS  Constitutional: Denies any fever or chills Gastrointestinal: No reported hemesis, hematochezia, vomiting, or acute GI distress Musculoskeletal: Left knee pain Neurological:  No reported episodes of acute onset apraxia, aphasia, dysarthria, agnosia, amnesia, paralysis, loss of coordination, or loss of consciousness  Medication Review  CoQ10, HYDROcodone-acetaminophen, albuterol, atorvastatin, glucose blood, multivitamin with minerals, and vitamin C  History Review  Allergy: Mr. Pat has No Known Allergies. Drug: Mr. Shadwick  reports previous drug use. Alcohol:  reports previous alcohol use. Tobacco:  reports that he quit smoking about 2 years ago. He has never used smokeless tobacco. Social: Mr. Rackley  reports that he quit smoking about 2 years ago. He has never used smokeless tobacco. He reports previous alcohol use. He reports previous drug use. Medical:  has a past medical history of Diabetes mellitus without complication ([place]) and Sick sinus syndrome ([place]). Surgical: Mr. Cihlar  has a past surgical history that includes PACEMAKER LEADLESS INSERTION (N/A, 08/28/2019) and TEMPORARY PACEMAKER (Right, 08/28/2019). Family: family history includes Arthritis in his mother; Diabetes in his father; Kidney disease in his father.  Laboratory Chemistry Profile   Renal Lab Results  Component Value Date   BUN 19 08/29/2019   CREATININE 1.19 08/29/2019   BCR 10 04/23/2018   GFRAA >60 08/29/2019   GFRNONAA >  60 08/29/2019     Hepatic Lab Results  Component Value Date   AST 31 08/09/2019   ALT 43 08/09/2019   ALBUMIN 4.3 08/09/2019   ALKPHOS 56 08/09/2019     Electrolytes Lab Results  Component Value Date   NA 137 08/29/2019   K 4.7 08/29/2019   CL 103 08/29/2019   CALCIUM 9.0 08/29/2019   MG 2.2 08/10/2019   PHOS 3.8 08/10/2019     Bone No results found for: VD25OH, VD125OH2TOT, LT9030SP2, ZR0076AU6, 25OHVITD1, 25OHVITD2, 25OHVITD3, TESTOFREE, TESTOSTERONE   Inflammation (CRP: Acute Phase) (ESR: Chronic Phase) No results found for: CRP, ESRSEDRATE, LATICACIDVEN     Note: Above Lab results reviewed.  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 In no apparent acute distress Mental status: Alert, oriented x 3 (person, place, & time)       Respiratory: No evidence of acute respiratory distress Eyes: PERLA Vitals: BP (!) 153/90   Pulse 74   Temp (!) 96.6 F (35.9 C) (Temporal)   Resp 16   Ht _0  (1.803 m)   Wt 180 lb (81.6 kg)   SpO2 100%   BMI 25.10 kg/m  BMI: Estimated body mass index is 25.1 kg/m as calculated from the following:   Height as of this encounter: _1  (1.803 m).   Weight as of this encounter: 180 lb (81.6 kg). Ideal: Ideal body weight: 75.3 kg (166 lb 0.1 oz) Adjusted ideal body weight: 77.8 kg (171 lb 9.7 oz)   Gait & Posture Assessment  Ambulation: Unassisted Gait: Relatively normal for age and body habitus Posture: WNL  Lower Extremity Exam    Side: Right lower extremity  Side: Left lower extremity  Stability: No instability observed          Stability: No instability observed          Skin & Extremity Inspection: Skin color, temperature, and hair growth are WNL. No peripheral edema or cyanosis. No masses, redness, swelling, asymmetry, or associated skin lesions. No contractures.  Skin & Extremity Inspection: Skin color, temperature, and hair growth are WNL. No peripheral edema or cyanosis. No masses, redness, swelling, asymmetry, or associated skin lesions. No contractures.  Functional ROM: Unrestricted ROM                  Functional ROM: Pain restricted ROM for hip and knee joints          Muscle Tone/Strength: Functionally intact. No obvious neuro-muscular anomalies detected.  Muscle Tone/Strength: Functionally intact. No obvious neuro-muscular anomalies detected.  Sensory (Neurological): Unimpaired        Sensory (Neurological): Arthropathic arthralgia        DTR: Patellar: deferred today Achilles:  deferred today Plantar: deferred today  DTR: Patellar: deferred today Achilles: deferred today Plantar: deferred today  Palpation: No palpable anomalies  Palpation: No palpable anomalies    Assessment   Status Diagnosis  Having a Flare-up Having a Flare-up Controlled 1. Chronic pain of left knee   2. Arthritis of left knee   3. Cervical radiculopathy   4. Cervical facet joint syndrome   5. Lumbar spondylosis   6. Lumbar facet joint pain   7. Localized osteoarthrosis of left hip   8. Chronic pain syndrome      Updated Problems: Problem  Chronic Pain of Left Knee  Arthritis of Left Knee  Cervical Radiculopathy    Plan of Care   Mr. Nikhil Osei has a current medication list which includes the following long-term medication(s): atorvastatin and ventolin hfa.  Pharmacotherapy (Medications Ordered): Meds ordered this encounter  Medications  . HYDROcodone-acetaminophen (NORCO) 7.5-325 MG tablet    Sig: Take 1 tablet by mouth 2 (two) times daily as needed for moderate pain.    Dispense:  60 tablet    Refill:  0  . HYDROcodone-acetaminophen (NORCO) 7.5-325 MG tablet    Sig: Take 1 tablet by mouth 2 (two) times daily as needed for moderate pain.    Dispense:  60 tablet    Refill:  0   Orders:  Orders Placed This Encounter  Procedures  . KNEE INJECTION    For knee pain. Hyalgan Knee injection(s). Please order hyalgan to be available.    Standing Status:   Standing    Number of Occurrences:   5    Standing Expiration Date:   03/08/2022    Scheduling Instructions:     Procedure: Intra-articular steroid Knee injection            Side(s): LEFT Knee     Sedation: None     TIMEFRAME: PRN procedure. (Mr. Gehl will call when needed.)    Order Specific Question:   Where will this procedure be performed?    Answer:   [facility]   Follow-up plan:   Return in about 3 months (around 06/07/2021) for Medication Management, in person.   Recent Visits Date Type  Provider Dept  12/09/20 Office Visit Andres Santa, MD [facility]  Showing recent visits within past 90 days and meeting all other requirements Today's Visits Date Type Provider Dept  03/08/21 Office Visit Andres Santa, MD [facility]  Showing today's visits and meeting all other requirements Future Appointments No visits were found meeting these conditions. Showing future appointments within next 90 days and meeting all other requirements  I discussed the assessment and treatment plan with the patient. The patient was provided an opportunity to ask questions and all were answered. The patient agreed with the plan and demonstrated an understanding of the instructions.  Patient advised to call back or seek an in-person evaluation if the symptoms or condition worsens.  Duration of encounter:30 minutes.  Note by: Andres Santa, MD Date: 03/08/2021; Time: 11:27 AM

## 2021-05-31 ENCOUNTER — Telehealth: Payer: Self-pay | Admitting: Student in an Organized Health Care Education/Training Program

## 2021-05-31 NOTE — Telephone Encounter (Signed)
Patient is calling about his med refill. Says he is out of meds. Has appt 06-08-21 please check and let him know.

## 2021-05-31 NOTE — Telephone Encounter (Signed)
I have a bubble out to Dr. Cherylann Ratel.

## 2021-05-31 NOTE — Telephone Encounter (Signed)
Patient on the schedule for tomorrow at 4:40 for VV/MM. Attempted to call patient . No answer. LVM for him to return call to go over previsit and to confirm appointment.

## 2021-06-01 ENCOUNTER — Encounter: Payer: Self-pay | Admitting: Student in an Organized Health Care Education/Training Program

## 2021-06-01 ENCOUNTER — Other Ambulatory Visit: Payer: Self-pay

## 2021-06-01 ENCOUNTER — Ambulatory Visit
Payer: Medicare Other | Attending: Student in an Organized Health Care Education/Training Program | Admitting: Student in an Organized Health Care Education/Training Program

## 2021-06-01 DIAGNOSIS — M5412 Radiculopathy, cervical region: Secondary | ICD-10-CM | POA: Diagnosis not present

## 2021-06-01 DIAGNOSIS — M1612 Unilateral primary osteoarthritis, left hip: Secondary | ICD-10-CM

## 2021-06-01 DIAGNOSIS — M25562 Pain in left knee: Secondary | ICD-10-CM

## 2021-06-01 DIAGNOSIS — G8929 Other chronic pain: Secondary | ICD-10-CM

## 2021-06-01 DIAGNOSIS — M1712 Unilateral primary osteoarthritis, left knee: Secondary | ICD-10-CM | POA: Diagnosis not present

## 2021-06-01 DIAGNOSIS — M47816 Spondylosis without myelopathy or radiculopathy, lumbar region: Secondary | ICD-10-CM

## 2021-06-01 DIAGNOSIS — M5459 Other low back pain: Secondary | ICD-10-CM

## 2021-06-01 DIAGNOSIS — M47812 Spondylosis without myelopathy or radiculopathy, cervical region: Secondary | ICD-10-CM | POA: Diagnosis not present

## 2021-06-01 DIAGNOSIS — G894 Chronic pain syndrome: Secondary | ICD-10-CM

## 2021-06-01 MED ORDER — HYDROCODONE-ACETAMINOPHEN 7.5-325 MG PO TABS: 1.0000 | ORAL_TABLET | Freq: Two times a day (BID) | ORAL | 0 refills | Status: AC | PRN

## 2021-06-01 NOTE — Progress Notes (Signed)
Patient: Andres Torres  Service Category: E/M  Provider: Gillis Santa, MD  DOB: [birth year]  DOS: 06/01/2021  Location: Office  MRN: [id number]  Setting: Ambulatory outpatient  Referring Provider: Freddy Finner, NP  Type: Established Patient  Specialty: Interventional Pain Management  PCP: Andres Finner, NP  Location: Home  Delivery: TeleHealth     Virtual Encounter - Pain Management PROVIDER NOTE: Information contained herein reflects review and annotations entered in association with encounter. Interpretation of such information and data should be left to medically-trained personnel. Information provided to patient can be located elsewhere in the medical record under "Patient Instructions". Document created using STT-dictation technology, any transcriptional errors that may result from process are unintentional.    Contact & Pharmacy Preferred: [phone] Home: [phone] (home) Mobile: There is no such number on file (mobile). E-mail: [email or web address]  [place] [place] [address] Phone: [phone] Fax: [phone]   Pre-screening  Andres Torres offered "in-person" vs "virtual" encounter. He indicated preferring virtual for this encounter.   Reason COVID-19*  Social distancing based on CDC and AMA recommendations.   I contacted Andres Torres on 06/01/2021 via video conference.      I clearly identified myself as Gillis Santa, MD. I verified that I was speaking with the correct person using two identifiers (Name: Andres Torres, and date of birth: Dec 24, 1950).  Consent I sought verbal advanced consent from Andres Torres for virtual visit interactions. I informed Andres Torres of possible security and privacy concerns, risks, and limitations associated with providing "not-in-person" medical evaluation and management services. I also informed Andres Torres of the availability of "in-person" appointments.  Finally, I informed him that there would be a charge for the virtual visit and that he could be  personally, fully or partially, financially responsible for it. Andres Torres expressed understanding and agreed to proceed.   Historic Elements   Andres Torres is a 70 y.o. year old, male patient evaluated today after our last contact on 05/31/2021. Andres Torres  has a past medical history of Diabetes mellitus without complication ([place]) and Sick sinus syndrome ([place]). He also  has a past surgical history that includes PACEMAKER LEADLESS INSERTION (N/A, 08/28/2019) and TEMPORARY PACEMAKER (Right, 08/28/2019). Andres Torres has a current medication list which includes the following prescription(s): accu-chek guide, vitamin c, atorvastatin, coq10, hydrocodone-acetaminophen, multivitamin with minerals, and ventolin hfa. He  reports that he quit smoking about 3 years ago. His smoking use included cigarettes. He has never used smokeless tobacco. He reports previous alcohol use. He reports previous drug use. Andres Torres has No Known Allergies.   HPI  Today, he is being contacted for medication management.  Patient has been without his medications for the last 3 weeks.  He is having increased low back as well as left hip pain.  He states that he is having car troubles and as result has not been able to make it into the pain clinic.  I informed him that controlled substance refills need to be face-to-face however I will send in a refill for 1 month for hydrocodone as below.  Patient instructed to follow-up in 4 weeks for medication management face-to-face.  Patient endorsed understanding.  Pharmacotherapy Assessment   Analgesic: Norco 7.5 mg twice daily as needed, quantity 60/month   Monitoring: Andres Torres: PDMP reviewed during this encounter.       Pharmacotherapy: No side-effects or adverse reactions reported. Compliance: No problems identified. Effectiveness: Clinically  acceptable. Plan: Refer to "POC". UDS:  Summary   Date Value Ref Range Status  06/08/2020 Note  Final    Comment:    ==================================================================== [employer] Select 13 (MW) ==================================================================== Test                             Result       Flag       Units  Drug Present not Declared for Prescription Verification   Carboxy-THC                    25           UNEXPECTED ng/mg creat    Carboxy-THC is a metabolite of tetrahydrocannabinol (THC). Source of    THC is most commonly herbal marijuana or marijuana-based products,    but THC is also present in a scheduled prescription medication.    Trace amounts of THC can be present in hemp and cannabidiol (CBD)    products. This test is not intended to distinguish between delta-9-    tetrahydrocannabinol, the predominant form of THC in most herbal or    marijuana-based products, and delta-8-tetrahydrocannabinol.  Drug Absent but Declared for Prescription Verification   Hydrocodone                    Not Detected UNEXPECTED ng/mg creat ==================================================================== Test                      Result    Flag   Units      Ref Range   Creatinine              152              mg/dL      >=20 ==================================================================== Declared Medications:  The flagging and interpretation on this report are based on the  following declared medications.  Unexpected results may arise from  inaccuracies in the declared medications.   **Note: The testing scope of this panel includes these medications:   Hydrocodone (Norco)   **Note: The testing scope of this panel does not include the  following reported medications:   Acetaminophen (Norco)  Albuterol  Atorvastatin  Multivitamin  Ubiquinone (CoQ10)  Vitamin C ==================================================================== For clinical consultation, please call [phone]. ====================================================================      Laboratory Chemistry Profile   Renal Lab Results  Component Value Date   BUN 19 08/29/2019   CREATININE 1.19 08/29/2019   BCR 10 04/23/2018   GFRAA >60 08/29/2019   GFRNONAA >60 08/29/2019    Hepatic Lab Results  Component Value Date   AST 31 08/09/2019   ALT 43 08/09/2019   ALBUMIN 4.3 08/09/2019   ALKPHOS 56 08/09/2019    Electrolytes Lab Results  Component Value Date   NA 137 08/29/2019   K 4.7 08/29/2019   CL 103 08/29/2019   CALCIUM 9.0 08/29/2019   MG 2.2 08/10/2019   PHOS 3.8 08/10/2019    Bone No results found for: VD25OH, VD125OH2TOT, HE1740CX4, [id number], 25OHVITD1, 25OHVITD2, 25OHVITD3, TESTOFREE, TESTOSTERONE  Inflammation (CRP: Acute Phase) (ESR: Chronic Phase) No results found for: CRP, ESRSEDRATE, LATICACIDVEN       Note: Above Lab results reviewed.   Assessment  The primary encounter diagnosis was Chronic pain of left knee. Diagnoses of Arthritis of left knee, Cervical radiculopathy, Cervical facet joint syndrome, Lumbar spondylosis, Lumbar facet joint pain, Localized osteoarthrosis of left hip, and Chronic  pain syndrome were also pertinent to this visit.  Plan of Care    Mr. Krystian Younglove has a current medication list which includes the following long-term medication(s): atorvastatin and ventolin hfa.  Pharmacotherapy (Medications Ordered): Meds ordered this encounter  Medications   HYDROcodone-acetaminophen (NORCO) 7.5-325 MG tablet    Sig: Take 1 tablet by mouth every 12 (twelve) hours as needed for severe pain. Must last 30 days.    Dispense:  60 tablet    Refill:  0    Chronic Pain: STOP Act (Not applicable) Fill 1 day early if closed on refill date. Avoid benzodiazepines within 8 hours of opioids   Orders:  No orders of the defined types were placed in this encounter.  Follow-up plan:   Return in about 5 weeks (around 07/06/2021) for Medication  Management, in person.    Recent Visits Date Type Provider Dept  03/08/21 Office Visit Gillis Santa, MD [facility]  Showing recent visits within past 90 days and meeting all other requirements Today's Visits Date Type Provider Dept  06/01/21 Telemedicine Gillis Santa, MD [facility]  Showing today's visits and meeting all other requirements Future Appointments No visits were found meeting these conditions. Showing future appointments within next 90 days and meeting all other requirements I discussed the assessment and treatment plan with the patient. The patient was provided an opportunity to ask questions and all were answered. The patient agreed with the plan and demonstrated an understanding of the instructions.  Patient advised to call back or seek an in-person evaluation if the symptoms or condition worsens.  Duration of encounter: 82mnutes.  Note by: BGillis Santa MD Date: 06/01/2021; Time: 2:40 PM

## 2021-06-07 ENCOUNTER — Encounter: Payer: Medicare Other | Admitting: Student in an Organized Health Care Education/Training Program

## 2021-07-05 ENCOUNTER — Encounter: Payer: Self-pay | Admitting: Student in an Organized Health Care Education/Training Program

## 2021-07-05 ENCOUNTER — Ambulatory Visit
Payer: Medicare Other | Attending: Student in an Organized Health Care Education/Training Program | Admitting: Student in an Organized Health Care Education/Training Program

## 2021-07-05 ENCOUNTER — Other Ambulatory Visit: Payer: Self-pay

## 2021-07-05 VITALS — BP 154/80 | HR 84 | Temp 97.3°F | Resp 18 | Ht 71.0 in | Wt 180.0 lb

## 2021-07-05 DIAGNOSIS — M47812 Spondylosis without myelopathy or radiculopathy, cervical region: Secondary | ICD-10-CM | POA: Diagnosis not present

## 2021-07-05 DIAGNOSIS — G894 Chronic pain syndrome: Secondary | ICD-10-CM | POA: Insufficient documentation

## 2021-07-05 DIAGNOSIS — M25562 Pain in left knee: Secondary | ICD-10-CM | POA: Insufficient documentation

## 2021-07-05 DIAGNOSIS — M1712 Unilateral primary osteoarthritis, left knee: Secondary | ICD-10-CM | POA: Diagnosis not present

## 2021-07-05 DIAGNOSIS — M5412 Radiculopathy, cervical region: Secondary | ICD-10-CM | POA: Diagnosis not present

## 2021-07-05 DIAGNOSIS — M47816 Spondylosis without myelopathy or radiculopathy, lumbar region: Secondary | ICD-10-CM | POA: Insufficient documentation

## 2021-07-05 DIAGNOSIS — G8929 Other chronic pain: Secondary | ICD-10-CM | POA: Insufficient documentation

## 2021-07-05 MED ORDER — HYDROCODONE-ACETAMINOPHEN 7.5-325 MG PO TABS: 1.0000 | ORAL_TABLET | Freq: Two times a day (BID) | ORAL | 0 refills | Status: AC | PRN

## 2021-07-05 MED ORDER — HYDROCODONE-ACETAMINOPHEN 7.5-325 MG PO TABS: 1.0000 | ORAL_TABLET | Freq: Two times a day (BID) | ORAL | 0 refills | Status: DC | PRN

## 2021-07-05 NOTE — Progress Notes (Signed)
PROVIDER NOTE: Information contained herein reflects review and annotations entered in association with encounter. Interpretation of such information and data should be left to medically-trained personnel. Information provided to patient can be located elsewhere in the medical record under "Patient Instructions". Document created using STT-dictation technology, any transcriptional errors that may result from process are unintentional.    Patient: Andres Torres  Service Category: E/M  Provider: Gillis Santa, MD  DOB: 1951-04-29  DOS: 07/05/2021  Specialty: Interventional Pain Management  MRN: [id number]  Setting: Ambulatory outpatient  PCP: Andres Finner, NP  Type: Established Patient    Referring Provider: Freddy Finner, NP  Location: Office  Delivery: Face-to-face     HPI  [name], a 70 y.o. year old male, is here today because of his Chronic pain of left knee [M25.562, G89.29]. [name] primary complain today is Hip Pain (left) and Back Pain (Low, mid and upper) Last encounter: My last encounter with him was on 06/01/2021. Pertinent problems: [name] has Localized osteoarthrosis of left hip; Foot arch pain; Chronic pain syndrome; Lumbar degenerative disc disease; Chronic right shoulder pain; Lumbar spondylosis; Lumbar facet joint pain; and Cervical facet joint syndrome on their pertinent problem list. Pain Assessment: Severity of Chronic pain is reported as a 7 /10. Location: Hip Left/ . Onset: More than a month ago. Quality: Larence Penning. Timing: Constant. Modifying factor(s): medication, warm baths. Vitals:  height is _0  (1.803 m) and weight is 180 lb (81.6 kg). His temperature is 97.3 F (36.3 C) (abnormal). His blood pressure is 154/80 (abnormal) and his pulse is 84. His respiration is 18 and oxygen saturation is 100%.   Reason for encounter: medication management.    Patient presents today for medication management as well as increased left knee pain that is worse with  weightbearing.   Refill of Hydrocodone as below, no change in dose Has another grandchild arriving soon    Pharmacotherapy Assessment  Analgesic: Norco 7.5 mg twice daily as needed, quantity 60/month   Monitoring: [place] PMP: PDMP reviewed during this encounter.       Pharmacotherapy: No side-effects or adverse reactions reported. Compliance: No problems identified. Effectiveness: Clinically acceptable.  UDS:  Summary  Date Value Ref Range Status  06/08/2020 Note  Final    Comment:    ==================================================================== [employer] Select 13 (MW) ==================================================================== Test                             Result       Flag       Units  Drug Present not Declared for Prescription Verification   Carboxy-THC                    25           UNEXPECTED ng/mg creat    Carboxy-THC is a metabolite of tetrahydrocannabinol (THC). Source of    THC is most commonly herbal marijuana or marijuana-based products,    but THC is also present in a scheduled prescription medication.    Trace amounts of THC can be present in hemp and cannabidiol (CBD)    products. This test is not intended to distinguish between delta-9-    tetrahydrocannabinol, the predominant form of THC in most herbal or    marijuana-based products, and delta-8-tetrahydrocannabinol.  Drug Absent but Declared for Prescription Verification   Hydrocodone  Not Detected UNEXPECTED ng/mg creat ==================================================================== Test                      Result    Flag   Units      Ref Range   Creatinine              152              mg/dL      >=20 ==================================================================== Declared Medications:  The flagging and interpretation on this report are based on the  following declared medications.  Unexpected results may arise from  inaccuracies in the declared medications.    **Note: The testing scope of this panel includes these medications:   Hydrocodone (Norco)   **Note: The testing scope of this panel does not include the  following reported medications:   Acetaminophen (Norco)  Albuterol  Atorvastatin  Multivitamin  Ubiquinone (CoQ10)  Vitamin C ==================================================================== For clinical consultation, please call [phone]. ====================================================================       ROS  Constitutional: Denies any fever or chills Gastrointestinal: No reported hemesis, hematochezia, vomiting, or acute GI distress Musculoskeletal:  Left knee pain Neurological: No reported episodes of acute onset apraxia, aphasia, dysarthria, agnosia, amnesia, paralysis, loss of coordination, or loss of consciousness  Medication Review  CoQ10, HYDROcodone-acetaminophen, albuterol, atorvastatin, glucose blood, multivitamin with minerals, and vitamin C  History Review  Allergy: [name] has No Known Allergies. Drug: [name]  reports that he does not currently use drugs. Alcohol:  reports that he does not currently use alcohol. Tobacco:  reports that he quit smoking about 3 years ago. His smoking use included cigarettes. He has never used smokeless tobacco. Social: [name]  reports that he quit smoking about 3 years ago. His smoking use included cigarettes. He has never used smokeless tobacco. He reports that he does not currently use alcohol. He reports that he does not currently use drugs. Medical:  has a past medical history of Diabetes mellitus without complication ([place]) and Sick sinus syndrome ([place]). Surgical: [name]  has a past surgical history that includes PACEMAKER LEADLESS INSERTION (N/A, 08/28/2019) and TEMPORARY PACEMAKER (Right, 08/28/2019). Family: family history includes Arthritis in his mother; Diabetes in his father; Kidney disease in his father.  Laboratory Chemistry Profile    Renal Lab Results  Component Value Date   BUN 19 08/29/2019   CREATININE 1.19 08/29/2019   BCR 10 04/23/2018   GFRAA >60 08/29/2019   GFRNONAA >60 08/29/2019     Hepatic Lab Results  Component Value Date   AST 31 08/09/2019   ALT 43 08/09/2019   ALBUMIN 4.3 08/09/2019   ALKPHOS 56 08/09/2019     Electrolytes Lab Results  Component Value Date   NA 137 08/29/2019   K 4.7 08/29/2019   CL 103 08/29/2019   CALCIUM 9.0 08/29/2019   MG 2.2 08/10/2019   PHOS 3.8 08/10/2019     Bone No results found for: VD25OH, VD125OH2TOT, GN0037CW8, GQ9169IH0, 25OHVITD1, 25OHVITD2, 25OHVITD3, TESTOFREE, TESTOSTERONE   Inflammation (CRP: Acute Phase) (ESR: Chronic Phase) No results found for: CRP, ESRSEDRATE, LATICACIDVEN     Note: Above Lab results reviewed.  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 In no apparent acute distress Mental status: Alert, oriented x 3 (person, place, & time)  Respiratory: No evidence of acute respiratory distress Eyes: PERLA Vitals: BP (!) 154/80 (BP Location: Right Arm, Patient Position: Sitting, Cuff Size: Normal)   Pulse 84   Temp (!) 97.3 F (36.3 C)   Resp 18   Ht _0  (1.803 m)   Wt 180 lb (81.6 kg)   SpO2 100%   BMI 25.10 kg/m  BMI: Estimated body mass index is 25.1 kg/m as calculated from the following:   Height as of this encounter: _1  (1.803 m).   Weight as of this encounter: 180 lb (81.6 kg). Ideal: Ideal body weight: 75.3 kg (166 lb 0.1 oz) Adjusted ideal body weight: 77.8 kg (171 lb 9.7 oz)   Gait & Posture Assessment  Ambulation: Unassisted Gait: Relatively normal for age and body habitus Posture: WNL  Lower Extremity Exam    Side: Right lower extremity  Side: Left lower extremity  Stability: No instability observed           Stability: No instability observed          Skin & Extremity Inspection: Skin color, temperature, and hair growth are WNL. No peripheral edema or cyanosis. No masses, redness, swelling, asymmetry, or associated skin lesions. No contractures.  Skin & Extremity Inspection: Skin color, temperature, and hair growth are WNL. No peripheral edema or cyanosis. No masses, redness, swelling, asymmetry, or associated skin lesions. No contractures.  Functional ROM: Unrestricted ROM                  Functional ROM: Pain restricted ROM for hip and knee joints          Muscle Tone/Strength: Functionally intact. No obvious neuro-muscular anomalies detected.  Muscle Tone/Strength: Functionally intact. No obvious neuro-muscular anomalies detected.  Sensory (Neurological): Unimpaired        Sensory (Neurological): Arthropathic arthralgia        DTR: Patellar: deferred today Achilles: deferred today Plantar: deferred today  DTR: Patellar: deferred today Achilles: deferred today Plantar: deferred today  Palpation: No palpable anomalies  Palpation: No palpable anomalies    Assessment   Status Diagnosis  Persistent Persistent Persistent 1. Chronic pain of left knee   2. Arthritis of left knee   3. Cervical radiculopathy   4. Cervical facet joint syndrome   5. Lumbar spondylosis   6. Chronic pain syndrome       Plan of Care   Mr. Keighan Amezcua has a current medication list which includes the following long-term medication(s): atorvastatin and ventolin hfa.  Pharmacotherapy (Medications Ordered): Meds ordered this encounter  Medications   HYDROcodone-acetaminophen (NORCO) 7.5-325 MG tablet    Sig: Take 1 tablet by mouth 2 (two) times daily as needed for severe pain. Must last 30 days    Dispense:  60 tablet    Refill:  0    Chronic Pain: STOP Act (Not applicable) Fill 1 day early if closed on refill date. Avoid benzodiazepines within 8 hours of opioids   HYDROcodone-acetaminophen (NORCO) 7.5-325 MG  tablet    Sig: Take 1 tablet by mouth 2 (two) times daily as needed for severe pain. Must last 30 days    Dispense:  60 tablet    Refill:  0    Chronic Pain: STOP Act (Not applicable) Fill 1 day early if closed on refill date. Avoid benzodiazepines within 8 hours of opioids     Follow-up plan:   Return in about 9 weeks (around 09/06/2021) for Medication Management, in person.   Recent Visits Date Type Provider  Dept  06/01/21 Telemedicine Andres Santa, MD [facility]  Showing recent visits within past 90 days and meeting all other requirements Today's Visits Date Type Provider Dept  07/05/21 Office Visit Andres Santa, MD [facility]  Showing today's visits and meeting all other requirements Future Appointments Date Type Provider Dept  08/30/21 Appointment Andres Santa, MD [facility]  Showing future appointments within next 90 days and meeting all other requirements I discussed the assessment and treatment plan with the patient. The patient was provided an opportunity to ask questions and all were answered. The patient agreed with the plan and demonstrated an understanding of the instructions.  Patient advised to call back or seek an in-person evaluation if the symptoms or condition worsens.  Duration of encounter:30 minutes.  Note by: Andres Santa, MD Date: 07/05/2021; Time: 1:15 PM

## 2021-07-05 NOTE — Progress Notes (Signed)
Nursing Pain Medication Assessment:  Safety precautions to be maintained throughout the outpatient stay will include: orient to surroundings, keep bed in low position, maintain call bell within reach at all times, provide assistance with transfer out of bed and ambulation.  Medication Inspection Compliance: Pill count conducted under aseptic conditions, in front of the patient. Neither the pills nor the bottle was removed from the patient's sight at any time. Once count was completed pills were immediately returned to the patient in their original bottle.  Medication: Hydrocodone/APAP Pill/Patch Count:  0 of 60 pills remain Pill/Patch Appearance: Markings consistent with prescribed medication Bottle Appearance: Standard pharmacy container. Clearly labeled. Filled Date: 07 / 20 / 2022 Last Medication intake:   1 week ago

## 2021-08-30 ENCOUNTER — Other Ambulatory Visit: Payer: Self-pay

## 2021-08-30 ENCOUNTER — Ambulatory Visit
Payer: Medicare Other | Attending: Student in an Organized Health Care Education/Training Program | Admitting: Student in an Organized Health Care Education/Training Program

## 2021-08-30 ENCOUNTER — Encounter: Payer: Self-pay | Admitting: Student in an Organized Health Care Education/Training Program

## 2021-08-30 VITALS — BP 163/76 | HR 71 | Temp 97.1°F | Resp 16 | Ht 71.0 in | Wt 180.0 lb

## 2021-08-30 DIAGNOSIS — M1712 Unilateral primary osteoarthritis, left knee: Secondary | ICD-10-CM | POA: Diagnosis present

## 2021-08-30 DIAGNOSIS — M5412 Radiculopathy, cervical region: Secondary | ICD-10-CM

## 2021-08-30 DIAGNOSIS — G8929 Other chronic pain: Secondary | ICD-10-CM

## 2021-08-30 DIAGNOSIS — G894 Chronic pain syndrome: Secondary | ICD-10-CM | POA: Diagnosis present

## 2021-08-30 DIAGNOSIS — M25562 Pain in left knee: Secondary | ICD-10-CM | POA: Diagnosis present

## 2021-08-30 MED ORDER — OXYCODONE HCL 5 MG PO TABS: 5.0000 mg | ORAL_TABLET | Freq: Two times a day (BID) | ORAL | 0 refills | Status: AC | PRN

## 2021-08-30 NOTE — Progress Notes (Signed)
Nursing Pain Medication Assessment:  Safety precautions to be maintained throughout the outpatient stay will include: orient to surroundings, keep bed in low position, maintain call bell within reach at all times, provide assistance with transfer out of bed and ambulation.  Medication Inspection Compliance: Pill count conducted under aseptic conditions, in front of the patient. Neither the pills nor the bottle was removed from the patient's sight at any time. Once count was completed pills were immediately returned to the patient in their original bottle.  Medication: Hydrocodone/APAP Pill/Patch Count:  0 of 60 pills remain Pill/Patch Appearance: Markings consistent with prescribed medication Bottle Appearance: Standard pharmacy container. Clearly labeled. Filled Date: 09 / 22 / 2022 Last Medication intake:  Ran out of medicine more than 48 hours ago

## 2021-08-30 NOTE — Progress Notes (Signed)
PROVIDER NOTE: Information contained herein reflects review and annotations entered in association with encounter. Interpretation of such information and data should be left to medically-trained personnel. Information provided to patient can be located elsewhere in the medical record under "Patient Instructions". Document created using STT-dictation technology, any transcriptional errors that may result from process are unintentional.    Patient: Andres Torres  Service Category: E/M  Provider: Gillis Santa, MD  DOB: 1951-09-14  DOS: 08/30/2021  Specialty: Interventional Pain Management  MRN: [id number]  Setting: Ambulatory outpatient  PCP: Andres Finner, NP  Type: Established Patient    Referring Provider: Freddy Finner, NP  Location: Office  Delivery: Face-to-face     HPI  [name], a 70 y.o. year old male, is here today because of his Chronic pain of left knee [M25.562, G89.29]. [name] primary complain today is Hip Pain (Left ), Knee Pain (Left ), Foot Pain (Left ), Back Pain (Left ), and Neck Pain (Left ) Last encounter: My last encounter with him was on 07/05/2021. Pertinent problems: [name] has Localized osteoarthrosis of left hip; Foot arch pain; Chronic pain syndrome; Lumbar degenerative disc disease; Chronic right shoulder pain; Lumbar spondylosis; Lumbar facet joint pain; and Cervical facet joint syndrome on their pertinent problem list. Pain Assessment: Severity of Chronic pain is reported as a 9 /10. Location: Back (see visit info for additional pain sites.) Lower, Left/feels that all the pain is connected. Onset: More than a month ago. Quality: Discomfort, Constant, Aching, Burning, Sharp, Dull, Numbness, Tingling. Timing: Constant. Modifying factor(s): medications,  has been out x 6 days. topicals when he can. Vitals:  height is '5\' 11"'  (1.803 m) and weight is 180 lb (81.6 kg). His temporal temperature is 97.1 F (36.2 C) (abnormal). His blood pressure is 163/76 (abnormal)  and his pulse is 71. His respiration is 16 and oxygen saturation is 100%.   Reason for encounter: medication management.    Patient follows up today for medication management.  He is having increased pain in his left neck, low back left greater than right as well as bilateral knees and left hip.  This is chronic in nature.  No falls since his last visit with me.  He states that he has been utilizing more hydrocodone, usually 1.5 tablets daily.  For this reason he would like to discuss alternatives.  He has practiced a drug holiday for the last 7 days.  He would like to avoid Tylenol.  We discussed opioid rotation to oxycodone 5 mg twice daily as needed which is equivalent in dose, MME to his previous dose of hydrocodone.  Discussed bowel regimen including stool softener and MiraLAX.  Follow-up in 3 months for medication management.  UDS up-to-date and appropriate.  RN has   Pharmacotherapy Assessment  Analgesic: Norco 7.5 mg twice daily as needed, quantity 60/month   Monitoring: [place] PMP: PDMP reviewed during this encounter.       Pharmacotherapy: No side-effects or adverse reactions reported. Compliance: No problems identified. Effectiveness: Clinically acceptable.  UDS:  Summary  Date Value Ref Range Status  06/08/2020 Note  Final    Comment:    ==================================================================== [employer] Select 13 (MW) ==================================================================== Test                             Result       Flag       Units  Drug Present not Declared for Prescription Verification   Carboxy-THC  25           UNEXPECTED ng/mg creat    Carboxy-THC is a metabolite of tetrahydrocannabinol (THC). Source of    THC is most commonly herbal marijuana or marijuana-based products,    but THC is also present in a scheduled prescription medication.    Trace amounts of THC can be present in hemp and cannabidiol (CBD)    products. This test  is not intended to distinguish between delta-9-    tetrahydrocannabinol, the predominant form of THC in most herbal or    marijuana-based products, and delta-8-tetrahydrocannabinol.  Drug Absent but Declared for Prescription Verification   Hydrocodone                    Not Detected UNEXPECTED ng/mg creat ==================================================================== Test                      Result    Flag   Units      Ref Range   Creatinine              152              mg/dL      >=20 ==================================================================== Declared Medications:  The flagging and interpretation on this report are based on the  following declared medications.  Unexpected results may arise from  inaccuracies in the declared medications.   **Note: The testing scope of this panel includes these medications:   Hydrocodone (Norco)   **Note: The testing scope of this panel does not include the  following reported medications:   Acetaminophen (Norco)  Albuterol  Atorvastatin  Multivitamin  Ubiquinone (CoQ10)  Vitamin C ==================================================================== For clinical consultation, please call [phone]. ====================================================================       ROS  Constitutional: Denies any fever or chills Gastrointestinal: No reported hemesis, hematochezia, vomiting, or acute GI distress Musculoskeletal:  Left knee pain Neurological: No reported episodes of acute onset apraxia, aphasia, dysarthria, agnosia, amnesia, paralysis, loss of coordination, or loss of consciousness  Medication Review  CoQ10, albuterol, atorvastatin, glucose blood, multivitamin with minerals, oxyCODONE, sitaGLIPtin, and vitamin C  History Review  Allergy: Andres Torres has No Known Allergies. Drug: Andres Torres  reports that he does not currently use drugs. Alcohol:  reports that he does not currently use alcohol. Tobacco:  reports  that he quit smoking about 3 years ago. His smoking use included cigarettes. He has never used smokeless tobacco. Social: Andres Torres  reports that he quit smoking about 3 years ago. His smoking use included cigarettes. He has never used smokeless tobacco. He reports that he does not currently use alcohol. He reports that he does not currently use drugs. Medical:  has a past medical history of Diabetes mellitus without complication ([place]) and Sick sinus syndrome ([place]). Surgical: Andres Torres  has a past surgical history that includes PACEMAKER LEADLESS INSERTION (N/A, 08/28/2019) and TEMPORARY PACEMAKER (Right, 08/28/2019). Family: family history includes Arthritis in his mother; Diabetes in his father; Kidney disease in his father.  Laboratory Chemistry Profile   Renal Lab Results  Component Value Date   BUN 19 08/29/2019   CREATININE 1.19 08/29/2019   BCR 10 04/23/2018   GFRAA >60 08/29/2019   GFRNONAA >60 08/29/2019     Hepatic Lab Results  Component Value Date   AST 31 08/09/2019   ALT 43 08/09/2019   ALBUMIN 4.3 08/09/2019   ALKPHOS 56 08/09/2019     Electrolytes Lab Results  Component Value Date  NA 137 08/29/2019   K 4.7 08/29/2019   CL 103 08/29/2019   CALCIUM 9.0 08/29/2019   MG 2.2 08/10/2019   PHOS 3.8 08/10/2019     Bone No results found for: VD25OH, VD125OH2TOT, GX2119ER7, EY8144YJ8, 25OHVITD1, 25OHVITD2, 25OHVITD3, TESTOFREE, TESTOSTERONE   Inflammation (CRP: Acute Phase) (ESR: Chronic Phase) No results found for: CRP, ESRSEDRATE, LATICACIDVEN     Note: Above Lab results reviewed.   Physical Exam  General appearance: Well nourished, well developed, and well hydrated. In no apparent acute distress Mental status: Alert, oriented x 3 (person, place, & time)       Respiratory: No evidence of acute respiratory distress Eyes: PERLA Vitals: BP (!) 163/76 (BP Location: Right Arm, Patient Position: Sitting, Cuff Size: Normal)   Pulse 71   Temp (!) 97.1 F (36.2  C) (Temporal)   Resp 16   Ht '5\' 11"'  (1.803 m)   Wt 180 lb (81.6 kg)   SpO2 100%   BMI 25.10 kg/m  BMI: Estimated body mass index is 25.1 kg/m as calculated from the following:   Height as of this encounter: '5\' 11"'  (1.803 m).   Weight as of this encounter: 180 lb (81.6 kg). Ideal: Ideal body weight: 75.3 kg (166 lb 0.1 oz) Adjusted ideal body weight: 77.8 kg (171 lb 9.7 oz)   Gait & Posture Assessment  Ambulation: Unassisted Gait: Relatively normal for age and body habitus Posture: WNL  Lower Extremity Exam    Side: Right lower extremity  Side: Left lower extremity  Stability: No instability observed          Stability: No instability observed          Skin & Extremity Inspection: Skin color, temperature, and hair growth are WNL. No peripheral edema or cyanosis. No masses, redness, swelling, asymmetry, or associated skin lesions. No contractures.  Skin & Extremity Inspection: Skin color, temperature, and hair growth are WNL. No peripheral edema or cyanosis. No masses, redness, swelling, asymmetry, or associated skin lesions. No contractures.  Functional ROM: Unrestricted ROM                  Functional ROM: Pain restricted ROM for hip and knee joints          Muscle Tone/Strength: Functionally intact. No obvious neuro-muscular anomalies detected.  Muscle Tone/Strength: Functionally intact. No obvious neuro-muscular anomalies detected.  Sensory (Neurological): Unimpaired        Sensory (Neurological): Arthropathic arthralgia        DTR: Patellar: deferred today Achilles: deferred today Plantar: deferred today  DTR: Patellar: deferred today Achilles: deferred today Plantar: deferred today  Palpation: No palpable anomalies  Palpation: No palpable anomalies    Assessment   Status Diagnosis  Persistent Persistent Persistent 1. Chronic pain of left knee   2. Arthritis of left knee   3. Cervical radiculopathy   4. Chronic pain syndrome       Plan of Care   Andres Torres has a current medication list which includes the following long-term medication(s): atorvastatin and ventolin hfa.  Pharmacotherapy (Medications Ordered): Meds ordered this encounter  Medications   oxyCODONE (OXY IR/ROXICODONE) 5 MG immediate release tablet    Sig: Take 1 tablet (5 mg total) by mouth every 12 (twelve) hours as needed for severe pain. Must last 30 days.    Dispense:  60 tablet    Refill:  0    Chronic Pain: STOP Act (Not applicable) Fill 1 day early if closed on refill date.  Avoid benzodiazepines within 8 hours of opioids   oxyCODONE (OXY IR/ROXICODONE) 5 MG immediate release tablet    Sig: Take 1 tablet (5 mg total) by mouth every 12 (twelve) hours as needed for severe pain. Must last 30 days.    Dispense:  60 tablet    Refill:  0    Chronic Pain: STOP Act (Not applicable) Fill 1 day early if closed on refill date. Avoid benzodiazepines within 8 hours of opioids   oxyCODONE (OXY IR/ROXICODONE) 5 MG immediate release tablet    Sig: Take 1 tablet (5 mg total) by mouth every 12 (twelve) hours as needed for severe pain. Must last 30 days.    Dispense:  60 tablet    Refill:  0    Chronic Pain: STOP Act (Not applicable) Fill 1 day early if closed on refill date. Avoid benzodiazepines within 8 hours of opioids   Opioid rotation given tolerance to hydrocodone.  Equivalent MME.  Follow-up plan:   Return in about 3 months (around 11/30/2021) for Medication Management, in person.   Recent Visits Date Type Provider Dept  07/05/21 Office Visit Andres Santa, MD [facility]  06/01/21 Telemedicine Andres Santa, MD [facility]  Showing recent visits within past 90 days and meeting all other requirements Today's Visits Date Type Provider Dept  08/30/21 Office Visit Andres Santa, MD [facility]  Showing today's visits and meeting all other requirements Future Appointments No visits were found meeting these conditions. Showing future  appointments within next 90 days and meeting all other requirements I discussed the assessment and treatment plan with the patient. The patient was provided an opportunity to ask questions and all were answered. The patient agreed with the plan and demonstrated an understanding of the instructions.  Patient advised to call back or seek an in-person evaluation if the symptoms or condition worsens.  Duration of encounter:30 minutes.  Note by: Andres Santa, MD Date: 08/30/2021; Time: 9:29 AM

## 2021-09-13 ENCOUNTER — Encounter: Payer: Self-pay | Admitting: *Deleted

## 2021-11-29 ENCOUNTER — Ambulatory Visit
Payer: Medicare Other | Attending: Student in an Organized Health Care Education/Training Program | Admitting: Student in an Organized Health Care Education/Training Program

## 2021-11-29 ENCOUNTER — Encounter: Payer: Self-pay | Admitting: Student in an Organized Health Care Education/Training Program

## 2021-11-29 ENCOUNTER — Other Ambulatory Visit: Payer: Self-pay

## 2021-11-29 VITALS — BP 152/74 | HR 72 | Temp 98.1°F | Resp 14 | Ht 71.0 in | Wt 181.0 lb

## 2021-11-29 DIAGNOSIS — G8929 Other chronic pain: Secondary | ICD-10-CM

## 2021-11-29 DIAGNOSIS — M5412 Radiculopathy, cervical region: Secondary | ICD-10-CM

## 2021-11-29 DIAGNOSIS — G894 Chronic pain syndrome: Secondary | ICD-10-CM | POA: Diagnosis present

## 2021-11-29 DIAGNOSIS — M25562 Pain in left knee: Secondary | ICD-10-CM | POA: Diagnosis present

## 2021-11-29 DIAGNOSIS — M47812 Spondylosis without myelopathy or radiculopathy, cervical region: Secondary | ICD-10-CM | POA: Diagnosis present

## 2021-11-29 DIAGNOSIS — M1712 Unilateral primary osteoarthritis, left knee: Secondary | ICD-10-CM | POA: Diagnosis present

## 2021-11-29 DIAGNOSIS — M47816 Spondylosis without myelopathy or radiculopathy, lumbar region: Secondary | ICD-10-CM

## 2021-11-29 MED ORDER — HYDROCODONE-ACETAMINOPHEN 7.5-325 MG PO TABS: 1.0000 | ORAL_TABLET | Freq: Two times a day (BID) | ORAL | 0 refills | Status: AC | PRN

## 2021-11-29 NOTE — Progress Notes (Addendum)
PROVIDER NOTE: Information contained herein reflects review and annotations entered in association with encounter. Interpretation of such information and data should be left to medically-trained personnel. Information provided to patient can be located elsewhere in the medical record under "Patient Instructions". Document created using STT-dictation technology, any transcriptional errors that may result from process are unintentional.    Patient: Andres Torres  Service Category: E/M  Provider: Gillis Santa, MD  DOB: [birth year]  DOS: 11/29/2021  Specialty: Interventional Pain Management  MRN: [id number]  Setting: Ambulatory outpatient  PCP: Freddy Finner, NP  Type: Established Patient    Referring Provider: Freddy Finner, NP  Location: Office  Delivery: Face-to-face     HPI  [name], a 71 y.o. year old male, is here today because of his Chronic pain of left knee [M25.562, G89.29]. Mr. Beckford primary complain today is Neck Pain and Hip Pain (left)  Last encounter: My last encounter with him was on 08/30/2021  Pertinent problems: Mr. Homewood has Localized osteoarthrosis of left hip; Foot arch pain; Chronic pain syndrome; Lumbar degenerative disc disease; Chronic right shoulder pain; Lumbar spondylosis; Lumbar facet joint pain; and Cervical facet joint syndrome on their pertinent problem list. Pain Assessment: Severity of Chronic pain is reported as a 7 /10. Location: Hip Left/denies. Onset: More than a month ago. Quality: Larence Penning. Timing: Constant. Modifying factor(s): elevate left leg, topical ointment, heat. Vitals:  height is '5\' 11"'  (1.803 m) and weight is 181 lb (82.1 kg). His temporal temperature is 98.1 F (36.7 C). His blood pressure is 152/74 (abnormal) and his pulse is 72. His respiration is 14 and oxygen saturation is 100%.   Reason for encounter: medication management.    Patient follows up today for medication management.  Opioid rotation to oxycodone at his last visit  however patient would like to transition back to hydrocodone which she found more helpful.  We will go back to his previous dose of hydrocodone 7.5 mg twice daily as needed.   Pharmacotherapy Assessment  Analgesic: Norco 7.5 mg twice daily as needed, quantity 60/month   Monitoring: [place] PMP: PDMP reviewed during this encounter.       Pharmacotherapy: No side-effects or adverse reactions reported. Compliance: No problems identified. Effectiveness: Clinically acceptable.  UDS:  Summary  Date Value Ref Range Status  06/08/2020 Note  Final    Comment:    ==================================================================== [employer] Select 13 (MW) ==================================================================== Test                             Result       Flag       Units  Drug Present not Declared for Prescription Verification   Carboxy-THC                    25           UNEXPECTED ng/mg creat    Carboxy-THC is a metabolite of tetrahydrocannabinol (THC). Source of    THC is most commonly herbal marijuana or marijuana-based products,    but THC is also present in a scheduled prescription medication.    Trace amounts of THC can be present in hemp and cannabidiol (CBD)    products. This test is not intended to distinguish between delta-9-    tetrahydrocannabinol, the predominant form of THC in most herbal or    marijuana-based products, and delta-8-tetrahydrocannabinol.  Drug Absent but Declared for Prescription Verification   Hydrocodone  Not Detected UNEXPECTED ng/mg creat ==================================================================== Test                      Result    Flag   Units      Ref Range   Creatinine              152              mg/dL      >=20 ==================================================================== Declared Medications:  The flagging and interpretation on this report are based on the  following declared medications.  Unexpected  results may arise from  inaccuracies in the declared medications.   **Note: The testing scope of this panel includes these medications:   Hydrocodone (Norco)   **Note: The testing scope of this panel does not include the  following reported medications:   Acetaminophen (Norco)  Albuterol  Atorvastatin  Multivitamin  Ubiquinone (CoQ10)  Vitamin C ==================================================================== For clinical consultation, please call [phone]. ====================================================================       ROS  Constitutional: Denies any fever or chills Gastrointestinal: No reported hemesis, hematochezia, vomiting, or acute GI distress Musculoskeletal:  Left knee pain Neurological: No reported episodes of acute onset apraxia, aphasia, dysarthria, agnosia, amnesia, paralysis, loss of coordination, or loss of consciousness  Medication Review  CoQ10, HYDROcodone-acetaminophen, albuterol, atorvastatin, glucose blood, multivitamin with minerals, and vitamin C  History Review  Allergy: Mr. Wesely has No Known Allergies. Drug: Mr. Michel  reports that he does not currently use drugs. Alcohol:  reports that he does not currently use alcohol. Tobacco:  reports that he quit smoking about 3 years ago. His smoking use included cigarettes. He has never used smokeless tobacco. Social: Mr. Rennels  reports that he quit smoking about 3 years ago. His smoking use included cigarettes. He has never used smokeless tobacco. He reports that he does not currently use alcohol. He reports that he does not currently use drugs. Medical:  has a past medical history of Diabetes mellitus without complication ([place]) and Sick sinus syndrome ([place]). Surgical: Mr. Mutchler  has a past surgical history that includes PACEMAKER LEADLESS INSERTION (N/A, 08/28/2019) and TEMPORARY PACEMAKER (Right, 08/28/2019). Family: family history includes Arthritis in his mother; Diabetes in his  father; Kidney disease in his father.  Laboratory Chemistry Profile   Renal Lab Results  Component Value Date   BUN 19 08/29/2019   CREATININE 1.19 08/29/2019   BCR 10 04/23/2018   GFRAA >60 08/29/2019   GFRNONAA >60 08/29/2019     Hepatic Lab Results  Component Value Date   AST 31 08/09/2019   ALT 43 08/09/2019   ALBUMIN 4.3 08/09/2019   ALKPHOS 56 08/09/2019     Electrolytes Lab Results  Component Value Date   NA 137 08/29/2019   K 4.7 08/29/2019   CL 103 08/29/2019   CALCIUM 9.0 08/29/2019   MG 2.2 08/10/2019   PHOS 3.8 08/10/2019     Bone No results found for: VD25OH, VD125OH2TOT, [id number], [id number], 25OHVITD1, 25OHVITD2, 25OHVITD3, TESTOFREE, TESTOSTERONE   Inflammation (CRP: Acute Phase) (ESR: Chronic Phase) No results found for: CRP, ESRSEDRATE, LATICACIDVEN     Note: Above Lab results reviewed.   Physical Exam  General appearance: Well nourished, well developed, and well hydrated. In no apparent acute distress Mental status: Alert, oriented x 3 (person, place, & time)       Respiratory: No evidence of acute respiratory distress Eyes: PERLA Vitals: BP (!) 152/74    Pulse 72    Temp 98.1  F (36.7 C) (Temporal)    Resp 14    Ht '5\' 11"'  (1.803 m)    Wt 181 lb (82.1 kg)    SpO2 100%    BMI 25.24 kg/m  BMI: Estimated body mass index is 25.24 kg/m as calculated from the following:   Height as of this encounter: '5\' 11"'  (1.803 m).   Weight as of this encounter: 181 lb (82.1 kg). Ideal: Ideal body weight: 75.3 kg (166 lb 0.1 oz) Adjusted ideal body weight: 78 kg (172 lb 0.1 oz)   Gait & Posture Assessment  Ambulation: Unassisted Gait: Relatively normal for age and body habitus Posture: WNL  Lower Extremity Exam    Side: Right lower extremity  Side: Left lower extremity  Stability: No instability observed          Stability: No instability observed          Skin & Extremity Inspection: Skin color, temperature, and hair growth are WNL. No peripheral edema  or cyanosis. No masses, redness, swelling, asymmetry, or associated skin lesions. No contractures.  Skin & Extremity Inspection: Skin color, temperature, and hair growth are WNL. No peripheral edema or cyanosis. No masses, redness, swelling, asymmetry, or associated skin lesions. No contractures.  Functional ROM: Unrestricted ROM                  Functional ROM: Pain restricted ROM for hip and knee joints          Muscle Tone/Strength: Functionally intact. No obvious neuro-muscular anomalies detected.  Muscle Tone/Strength: Functionally intact. No obvious neuro-muscular anomalies detected.  Sensory (Neurological): Unimpaired        Sensory (Neurological): Arthropathic arthralgia        DTR: Patellar: deferred today Achilles: deferred today Plantar: deferred today  DTR: Patellar: deferred today Achilles: deferred today Plantar: deferred today  Palpation: No palpable anomalies  Palpation: No palpable anomalies    Assessment   Status Diagnosis  Persistent Persistent Persistent 1. Chronic pain of left knee   2. Arthritis of left knee   3. Cervical radiculopathy   4. Lumbar spondylosis   5. Cervical facet joint syndrome   6. Chronic pain syndrome       Plan of Care   Mr. Barre Aydelott has a current medication list which includes the following long-term medication(s): atorvastatin and ventolin hfa.  Pharmacotherapy (Medications Ordered): Meds ordered this encounter  Medications   HYDROcodone-acetaminophen (NORCO) 7.5-325 MG tablet    Sig: Take 1 tablet by mouth every 12 (twelve) hours as needed for severe pain. Must last 30 days    Dispense:  60 tablet    Refill:  0    Chronic Pain: STOP Act (Not applicable) Fill 1 day early if closed on refill date. Avoid benzodiazepines within 8 hours of opioids   HYDROcodone-acetaminophen (NORCO) 7.5-325 MG tablet    Sig: Take 1 tablet by mouth every 12 (twelve) hours as needed for severe pain. Must last 30 days    Dispense:  60 tablet     Refill:  0    Chronic Pain: STOP Act (Not applicable) Fill 1 day early if closed on refill date. Avoid benzodiazepines within 8 hours of opioids   HYDROcodone-acetaminophen (NORCO) 7.5-325 MG tablet    Sig: Take 1 tablet by mouth every 12 (twelve) hours as needed for severe pain. Must last 30 days    Dispense:  60 tablet    Refill:  0    Chronic Pain: STOP Act (Not applicable) Fill 1  day early if closed on refill date. Avoid benzodiazepines within 8 hours of opioids    Follow-up plan:   Return in about 3 months (around 02/27/2022) for Medication Management, in person.   Recent Visits No visits were found meeting these conditions. Showing recent visits within past 90 days and meeting all other requirements Today's Visits Date Type Provider Dept  11/29/21 Office Visit Gillis Santa, MD [facility]  Showing today's visits and meeting all other requirements Future Appointments Date Type Provider Dept  02/23/22 Appointment Gillis Santa, MD [facility]  Showing future appointments within next 90 days and meeting all other requirements  I discussed the assessment and treatment plan with the patient. The patient was provided an opportunity to ask questions and all were answered. The patient agreed with the plan and demonstrated an understanding of the instructions.  Patient advised to call back or seek an in-person evaluation if the symptoms or condition worsens.  Duration of encounter:30 minutes.  Note by: Gillis Santa, MD Date: 11/29/2021; Time: 11:38 AM

## 2021-11-29 NOTE — Progress Notes (Signed)
Nursing Pain Medication Assessment:  Safety precautions to be maintained throughout the outpatient stay will include: orient to surroundings, keep bed in low position, maintain call bell within reach at all times, provide assistance with transfer out of bed and ambulation.  Medication Inspection Compliance: Mr. Ulrich did not comply with our request to bring his pills to be counted. He was reminded that bringing the medication bottles, even when empty, is a requirement.  Medication: None brought in. Pill/Patch Count: None available to be counted. Bottle Appearance: No container available. Did not bring bottle(s) to appointment. Filled Date: N/A Last Medication intake:  Yesterday 

## 2022-02-23 ENCOUNTER — Encounter: Payer: Medicare Other | Admitting: Student in an Organized Health Care Education/Training Program

## 2022-03-14 ENCOUNTER — Encounter: Payer: Self-pay | Admitting: Student in an Organized Health Care Education/Training Program

## 2022-03-14 ENCOUNTER — Ambulatory Visit
Payer: Medicare Other | Attending: Student in an Organized Health Care Education/Training Program | Admitting: Student in an Organized Health Care Education/Training Program

## 2022-03-14 VITALS — BP 168/90 | HR 84 | Temp 97.2°F | Resp 20 | Ht 71.0 in | Wt 180.0 lb

## 2022-03-14 DIAGNOSIS — M25562 Pain in left knee: Secondary | ICD-10-CM | POA: Diagnosis not present

## 2022-03-14 DIAGNOSIS — M47812 Spondylosis without myelopathy or radiculopathy, cervical region: Secondary | ICD-10-CM | POA: Diagnosis present

## 2022-03-14 DIAGNOSIS — M5459 Other low back pain: Secondary | ICD-10-CM | POA: Diagnosis present

## 2022-03-14 DIAGNOSIS — M1612 Unilateral primary osteoarthritis, left hip: Secondary | ICD-10-CM | POA: Diagnosis present

## 2022-03-14 DIAGNOSIS — M47816 Spondylosis without myelopathy or radiculopathy, lumbar region: Secondary | ICD-10-CM | POA: Diagnosis not present

## 2022-03-14 DIAGNOSIS — G894 Chronic pain syndrome: Secondary | ICD-10-CM | POA: Diagnosis present

## 2022-03-14 DIAGNOSIS — M1712 Unilateral primary osteoarthritis, left knee: Secondary | ICD-10-CM

## 2022-03-14 DIAGNOSIS — M5412 Radiculopathy, cervical region: Secondary | ICD-10-CM | POA: Diagnosis not present

## 2022-03-14 DIAGNOSIS — G8929 Other chronic pain: Secondary | ICD-10-CM | POA: Diagnosis present

## 2022-03-14 MED ORDER — OXYCODONE HCL 5 MG PO TABS: 5.0000 mg | ORAL_TABLET | Freq: Two times a day (BID) | ORAL | 0 refills | Status: AC | PRN

## 2022-03-14 NOTE — Progress Notes (Signed)
PROVIDER NOTE: Information contained herein reflects review and annotations entered in association with encounter. Interpretation of such information and data should be left to medically-trained personnel. Information provided to patient can be located elsewhere in the medical record under "Patient Instructions". Document created using STT-dictation technology, any transcriptional errors that may result from process are unintentional.  ?  ?Patient: Andres Torres  Service Category: E/M  Provider: Gillis Santa, MD  ?DOB: 11-Dec-1950  DOS: 03/14/2022  Specialty: Interventional Pain Management  ?MRN: [id number]  Setting: Ambulatory outpatient  PCP: Freddy Finner, NP  ?Type: Established Patient    Referring Provider: Freddy Finner, NP  ?Location: Office  Delivery: Face-to-face    ? ?HPI  ?Andres Torres, a 71 y.o. year old male, is here today because of his Chronic pain of left knee [M25.562, G89.29]. Andres Torres primary complain today is Hip Pain (left), Neck Pain, and Back Pain (lower) ? ?Last encounter: My last encounter with him was on 08/30/2021 ? ?Pertinent problems: Andres Torres has Localized osteoarthrosis of left hip; Foot arch pain; Chronic pain syndrome; Lumbar degenerative disc disease; Chronic right shoulder pain; Lumbar spondylosis; Lumbar facet joint pain; and Cervical facet joint syndrome on their pertinent problem list. ?Pain Assessment: Severity of Chronic pain is reported as a 8 /10. Location: Hip Left/ . Onset: More than a month ago. Quality: Stabbing, Throbbing. Timing: Constant. Modifying factor(s): rest. ?Vitals:  height is '5\' 11"'  (1.803 m) and weight is 180 lb (81.6 kg). His temperature is 97.2 ?F (36.2 ?C) (abnormal). His blood pressure is 168/90 (abnormal) and his pulse is 84. His respiration is 20 and oxygen saturation is 100%.  ? ?Reason for encounter: medication management.   ? ?Patient follows up today for medication management.  Opioid rotation from hydrocodone to oxycodone.  We usually do an  opioid rotation every 4 to 6 months to help with tolerance.  Patient is on a low-dose opioid therapy either way.  We will transition to oxycodone 5 mg every 12 hours as needed. ? ? ?Pharmacotherapy Assessment  ?Analgesic:  Oxycodone 5 mg  twice daily as needed, quantity 60/month  ? ?Monitoring: ?[place] PMP: PDMP reviewed during this encounter.       ?Pharmacotherapy: No side-effects or adverse reactions reported. ?Compliance: No problems identified. ?Effectiveness: Clinically acceptable.  UDS:  ?Summary  ?Date Value Ref Range Status  ?06/08/2020 Note  Final  ?  Comment:  ?  ==================================================================== ?[facility]ox[facility] Select 13 (MW) ?==================================================================== ?Test                             Result       Flag       Units ? ?Drug Present not Declared for Prescription Verification ?  Carboxy-THC                    25           UNEXPECTED ng/mg creat ?   Carboxy-THC is a metabolite of tetrahydrocannabinol (THC). Source of ?   THC is most commonly herbal marijuana or marijuana-based products, ?   but THC is also present in a scheduled prescription medication. ?   Trace amounts of THC can be present in hemp and cannabidiol (CBD) ?   products. This test is not intended to distinguish between delta-9- ?   tetrahydrocannabinol, the predominant form of THC in most herbal or ?   marijuana-based products, and delta-8-tetrahydrocannabinol. ? ?Drug Absent but Declared for Prescription Verification ?  Hydrocodone                    Not Detected UNEXPECTED ng/mg creat ?==================================================================== ?Test                      Result    Flag   Units      Ref Range ?  Creatinine              152              mg/dL      >=20 ?==================================================================== ?Declared Medications: ? The flagging and interpretation on this report are based on the ? following declared medications.   Unexpected results may arise from ? inaccuracies in the declared medications. ? ? **Note: The testing scope of this panel includes these medications: ? ? Hydrocodone (Norco) ? ? **Note: The testing scope of this panel does not include the ? following reported medications: ? ? Acetaminophen (Norco) ? Albuterol ? Atorvastatin ? Multivitamin ? Ubiquinone (CoQ10) ? Vitamin C ?==================================================================== ?For clinical consultation, please call [phone]. ?==================================================================== ?  ?  ? ? ?ROS  ?Constitutional: Denies any fever or chills ?Gastrointestinal: No reported hemesis, hematochezia, vomiting, or acute GI distress ?Musculoskeletal:  Left knee pain ?Neurological: No reported episodes of acute onset apraxia, aphasia, dysarthria, agnosia, amnesia, paralysis, loss of coordination, or loss of consciousness ? ?Medication Review  ?CoQ10, HYDROcodone-acetaminophen, albuterol, atorvastatin, glucose blood, multivitamin with minerals, oxyCODONE, traZODone, and vitamin C ? ?History Review  ?Allergy: Andres Torres has No Known Allergies. ?Drug: Andres Torres  reports that he does not currently use drugs. ?Alcohol:  reports that he does not currently use alcohol. ?Tobacco:  reports that he quit smoking about 3 years ago. His smoking use included cigarettes. He has never used smokeless tobacco. ?Social: Andres Torres  reports that he quit smoking about 3 years ago. His smoking use included cigarettes. He has never used smokeless tobacco. He reports that he does not currently use alcohol. He reports that he does not currently use drugs. ?Medical:  has a past medical history of Diabetes mellitus without complication ([place]) and Sick sinus syndrome ([place]). ?Surgical: Andres Torres  has a past surgical history that includes PACEMAKER LEADLESS INSERTION (N/A, 08/28/2019) and TEMPORARY PACEMAKER (Right, 08/28/2019). ?Family: family history includes Arthritis  in his mother; Diabetes in his father; Kidney disease in his father. ? ?Laboratory Chemistry Profile  ? ?Renal ?Lab Results  ?Component Value Date  ? BUN 19 08/29/2019  ? CREATININE 1.19 08/29/2019  ? BCR 10 04/23/2018  ? GFRAA >60 08/29/2019  ? GFRNONAA >60 08/29/2019  ? ?  Hepatic ?Lab Results  ?Component Value Date  ? AST 31 08/09/2019  ? ALT 43 08/09/2019  ? ALBUMIN 4.3 08/09/2019  ? ALKPHOS 56 08/09/2019  ? ?  ?Electrolytes ?Lab Results  ?Component Value Date  ? NA 137 08/29/2019  ? K 4.7 08/29/2019  ? CL 103 08/29/2019  ? CALCIUM 9.0 08/29/2019  ? MG 2.2 08/10/2019  ? PHOS 3.8 08/10/2019  ? ?  Bone ?No results found for: [place], [id number], [id number], [id number], 25OHVITD1, 25OHVITD2, 25OHVITD3, TESTOFREE, TESTOSTERONE ?  ?Inflammation (CRP: Acute Phase) (ESR: Chronic Phase) ?No results found for: CRP, ESRSEDRATE, LATICACIDVEN ?    ?Note: Above Lab results reviewed. ? ? ?Physical Exam  ?General appearance: Well nourished, well developed, and well hydrated. In no apparent acute distress ?Mental status: Alert, oriented x 3 (person, place, & time)       ?Respiratory:  No evidence of acute respiratory distress ?Eyes: PERLA ?Vitals: BP (!) 168/90   Pulse 84   Temp (!) 97.2 ?F (36.2 ?C)   Resp 20   Ht '5\' 11"'  (1.803 m)   Wt 180 lb (81.6 kg)   SpO2 100%   BMI 25.10 kg/m?  ?BMI: Estimated body mass index is 25.1 kg/m? as calculated from the following: ?  Height as of this encounter: '5\' 11"'  (1.803 m). ?  Weight as of this encounter: 180 lb (81.6 kg). ?Ideal: Ideal body weight: 75.3 kg (166 lb 0.1 oz) ?Adjusted ideal body weight: 77.8 kg (171 lb 9.7 oz)  ? ?Gait & Posture Assessment  ?Ambulation: Unassisted ?Gait: Relatively normal for age and body habitus ?Posture: WNL  ?Lower Extremity Exam    ?Side: Right lower extremity  Side: Left lower extremity  ?Stability: No instability observed          Stability: No instability observed          ?Skin & Extremity Inspection: Skin color, temperature, and hair growth are  WNL. No peripheral edema or cyanosis. No masses, redness, swelling, asymmetry, or associated skin lesions. No contractures.  Skin & Extremity Inspection: Skin color, temperature, and hair growth are WNL. No peripher

## 2022-03-14 NOTE — Progress Notes (Signed)
Nursing Pain Medication Assessment:  ?Safety precautions to be maintained throughout the outpatient stay will include: orient to surroundings, keep bed in low position, maintain call bell within reach at all times, provide assistance with transfer out of bed and ambulation.  ?Medication Inspection Compliance: Pill count conducted under aseptic conditions, in front of the patient. Neither the pills nor the bottle was removed from the patient's sight at any time. Once count was completed pills were immediately returned to the patient in their original bottle. ? ?Medication: Hydrocodone/APAP ?Pill/Patch Count:  0 of 60 pills remain ?Pill/Patch Appearance: Markings consistent with prescribed medication ?Bottle Appearance: Standard pharmacy container. Clearly labeled. ?Filled Date: 03 / 22 / 2023 ?Last Medication intake:   3 weeks ?

## 2022-06-06 ENCOUNTER — Ambulatory Visit
Payer: Medicare Other | Attending: Student in an Organized Health Care Education/Training Program | Admitting: Student in an Organized Health Care Education/Training Program

## 2022-06-06 ENCOUNTER — Encounter: Payer: Self-pay | Admitting: Student in an Organized Health Care Education/Training Program

## 2022-06-06 VITALS — BP 134/72 | HR 71 | Temp 97.6°F | Resp 16 | Ht 71.0 in | Wt 176.3 lb

## 2022-06-06 DIAGNOSIS — M5459 Other low back pain: Secondary | ICD-10-CM | POA: Diagnosis present

## 2022-06-06 DIAGNOSIS — G8929 Other chronic pain: Secondary | ICD-10-CM | POA: Insufficient documentation

## 2022-06-06 DIAGNOSIS — M5412 Radiculopathy, cervical region: Secondary | ICD-10-CM | POA: Insufficient documentation

## 2022-06-06 DIAGNOSIS — M1712 Unilateral primary osteoarthritis, left knee: Secondary | ICD-10-CM | POA: Diagnosis present

## 2022-06-06 DIAGNOSIS — M47812 Spondylosis without myelopathy or radiculopathy, cervical region: Secondary | ICD-10-CM | POA: Diagnosis present

## 2022-06-06 DIAGNOSIS — M47816 Spondylosis without myelopathy or radiculopathy, lumbar region: Secondary | ICD-10-CM | POA: Diagnosis present

## 2022-06-06 DIAGNOSIS — G894 Chronic pain syndrome: Secondary | ICD-10-CM | POA: Diagnosis present

## 2022-06-06 DIAGNOSIS — M1612 Unilateral primary osteoarthritis, left hip: Secondary | ICD-10-CM | POA: Insufficient documentation

## 2022-06-06 DIAGNOSIS — M25562 Pain in left knee: Secondary | ICD-10-CM | POA: Insufficient documentation

## 2022-06-06 MED ORDER — HYDROCODONE-ACETAMINOPHEN 7.5-325 MG PO TABS: 1.0000 | ORAL_TABLET | Freq: Two times a day (BID) | ORAL | 0 refills | Status: AC | PRN

## 2022-06-06 NOTE — Progress Notes (Signed)
PROVIDER NOTE: Information contained herein reflects review and annotations entered in association with encounter. Interpretation of such information and data should be left to medically-trained personnel. Information provided to patient can be located elsewhere in the medical record under "Patient Instructions". Document created using STT-dictation technology, any transcriptional errors that may result from process are unintentional.    Patient: Andres Torres  Service Category: E/M  Provider: Gillis Santa, MD  DOB: [birth year]  DOS: 06/06/2022  Specialty: Interventional Pain Management  MRN: [id number]  Setting: Ambulatory outpatient  PCP: Andres Finner, NP  Type: Established Patient    Referring Provider: Freddy Finner, NP  Location: Office  Delivery: Face-to-face     HPI  [name], a 71 y.o. year old male, is here today because of his Chronic pain of left knee [M25.562, G89.29]. [name] primary complain today is Joint Pain  Last encounter: My last encounter with him was on 03/14/22  Pertinent problems: [name] has Localized osteoarthrosis of left hip; Foot arch pain; Chronic pain syndrome; Lumbar degenerative disc disease; Chronic right shoulder pain; Lumbar spondylosis; Lumbar facet joint pain; and Cervical facet joint syndrome on their pertinent problem list. Pain Assessment: Severity of Chronic pain is reported as a 8 /10. Location: Other (Comment) (joint)  / . Onset: More than a month ago. Quality: Aching. Timing: Constant. Modifying factor(s): meds. Vitals:  height is _0  (1.803 m) and weight is 176 lb 4.8 oz (80 kg). His temporal temperature is 97.6 F (36.4 C). His blood pressure is 134/72 and his pulse is 71. His respiration is 16 and oxygen saturation is 96%.   Reason for encounter: medication management.    Patient follows up today for medication management.  Opioid rotation from oxycodone to hydrocodone.  We usually do an opioid rotation every 4 to 6 months to help  with tolerance.  We are doing this rotation slightly earlier as the patient is having limited benefit with oxycodone.  Patient is on a low-dose opioid therapy either way.  We will transition to hydrocodone 7.5 mg every 12 hours as needed.   Pharmacotherapy Assessment  Analgesic: Hydrocodone 7.5 twice daily as needed, quantity 60/month   Monitoring: [place] PMP: PDMP reviewed during this encounter.       Pharmacotherapy: No side-effects or adverse reactions reported. Compliance: No problems identified. Effectiveness: Clinically acceptable.  UDS:  Summary  Date Value Ref Range Status  06/08/2020 Note  Final    Comment:    ==================================================================== Tox[facility]SURE Select 13 (MW) ==================================================================== Test                             Result       Flag       Units  Drug Present not Declared for Prescription Verification   Carboxy-THC                    25           UNEXPECTED ng/mg creat    Carboxy-THC is a metabolite of tetrahydrocannabinol (THC). Source of    THC is most commonly herbal marijuana or marijuana-based products,    but THC is also present in a scheduled prescription medication.    Trace amounts of THC can be present in hemp and cannabidiol (CBD)    products. This test is not intended to distinguish between delta-9-    tetrahydrocannabinol, the predominant form of THC in most herbal or    marijuana-based products,  and delta-8-tetrahydrocannabinol.  Drug Absent but Declared for Prescription Verification   Hydrocodone                    Not Detected UNEXPECTED ng/mg creat ==================================================================== Test                      Result    Flag   Units      Ref Range   Creatinine              152              mg/dL      >=20 ==================================================================== Declared Medications:  The flagging and interpretation on this  report are based on the  following declared medications.  Unexpected results may arise from  inaccuracies in the declared medications.   **Note: The testing scope of this panel includes these medications:   Hydrocodone (Norco)   **Note: The testing scope of this panel does not include the  following reported medications:   Acetaminophen (Norco)  Albuterol  Atorvastatin  Multivitamin  Ubiquinone (CoQ10)  Vitamin C ==================================================================== For clinical consultation, please call [phone]. ====================================================================       ROS  Constitutional: Denies any fever or chills Gastrointestinal: No reported hemesis, hematochezia, vomiting, or acute GI distress Musculoskeletal:  Left knee pain Neurological: No reported episodes of acute onset apraxia, aphasia, dysarthria, agnosia, amnesia, paralysis, loss of coordination, or loss of consciousness  Medication Review  CoQ10, HYDROcodone-acetaminophen, albuterol, atorvastatin, glucose blood, multivitamin with minerals, oxyCODONE, traZODone, and vitamin C  History Review  Allergy: Andres Torres has No Known Allergies. Drug: Andres Torres  reports that he does not currently use drugs. Alcohol:  reports that he does not currently use alcohol. Tobacco:  reports that he quit smoking about 4 years ago. His smoking use included cigarettes. He has never used smokeless tobacco. Social: Andres Torres  reports that he quit smoking about 4 years ago. His smoking use included cigarettes. He has never used smokeless tobacco. He reports that he does not currently use alcohol. He reports that he does not currently use drugs. Medical:  has a past medical history of Diabetes mellitus without complication ([place]) and Sick sinus syndrome ([place]). Surgical: Andres Torres  has a past surgical history that includes PACEMAKER LEADLESS INSERTION (N/A, 08/28/2019) and TEMPORARY PACEMAKER  (Right, 08/28/2019). Family: family history includes Arthritis in his mother; Diabetes in his father; Kidney disease in his father.  Laboratory Chemistry Profile   Renal Lab Results  Component Value Date   BUN 19 08/29/2019   CREATININE 1.19 08/29/2019   BCR 10 04/23/2018   GFRAA >60 08/29/2019   GFRNONAA >60 08/29/2019     Hepatic Lab Results  Component Value Date   AST 31 08/09/2019   ALT 43 08/09/2019   ALBUMIN 4.3 08/09/2019   ALKPHOS 56 08/09/2019     Electrolytes Lab Results  Component Value Date   NA 137 08/29/2019   K 4.7 08/29/2019   CL 103 08/29/2019   CALCIUM 9.0 08/29/2019   MG 2.2 08/10/2019   PHOS 3.8 08/10/2019     Bone No results found for: "VD25OH", "VD125OH2TOT", "IW5809XI3", "JA2505LZ7", "25OHVITD1", "25OHVITD2", "25OHVITD3", "TESTOFREE", "TESTOSTERONE"   Inflammation (CRP: Acute Phase) (ESR: Chronic Phase) No results found for: "CRP", "ESRSEDRATE", "LATICACIDVEN"     Note: Above Lab results reviewed.   Physical Exam  General appearance: Well nourished, well developed, and well hydrated. In no apparent acute distress Mental status: Alert, oriented x  3 (person, place, & time)       Respiratory: No evidence of acute respiratory distress Eyes: PERLA Vitals: BP 134/72   Pulse 71   Temp 97.6 F (36.4 C) (Temporal)   Resp 16   Ht _0  (1.803 m)   Wt 176 lb 4.8 oz (80 kg)   SpO2 96%   BMI 24.59 kg/m  BMI: Estimated body mass index is 24.59 kg/m as calculated from the following:   Height as of this encounter: _1  (1.803 m).   Weight as of this encounter: 176 lb 4.8 oz (80 kg). Ideal: Ideal body weight: 75.3 kg (166 lb 0.1 oz) Adjusted ideal body weight: 77.2 kg (170 lb 2 oz)   Gait & Posture Assessment  Ambulation: Unassisted Gait: Relatively normal for age and body habitus Posture: WNL  Lower Extremity Exam    Side: Right lower extremity  Side: Left lower extremity  Stability: No instability observed          Stability: No  instability observed          Skin & Extremity Inspection: Skin color, temperature, and hair growth are WNL. No peripheral edema or cyanosis. No masses, redness, swelling, asymmetry, or associated skin lesions. No contractures.  Skin & Extremity Inspection: Skin color, temperature, and hair growth are WNL. No peripheral edema or cyanosis. No masses, redness, swelling, asymmetry, or associated skin lesions. No contractures.  Functional ROM: Unrestricted ROM                  Functional ROM: Pain restricted ROM for hip and knee joints          Muscle Tone/Strength: Functionally intact. No obvious neuro-muscular anomalies detected.  Muscle Tone/Strength: Functionally intact. No obvious neuro-muscular anomalies detected.  Sensory (Neurological): Unimpaired        Sensory (Neurological): Arthropathic arthralgia        DTR: Patellar: deferred today Achilles: deferred today Plantar: deferred today  DTR: Patellar: deferred today Achilles: deferred today Plantar: deferred today  Palpation: No palpable anomalies  Palpation: No palpable anomalies    Assessment   Status Diagnosis  Persistent Persistent Persistent 1. Chronic pain of left knee   2. Arthritis of left knee   3. Cervical radiculopathy   4. Lumbar spondylosis   5. Cervical facet joint syndrome   6. Lumbar facet joint pain   7. Localized osteoarthrosis of left hip   8. Chronic pain syndrome       Plan of Care   Mr. Ethin Drummond has a current medication list which includes the following long-term medication(s): atorvastatin, trazodone, and ventolin hfa.  Pharmacotherapy (Medications Ordered): Meds ordered this encounter  Medications   HYDROcodone-acetaminophen (NORCO) 7.5-325 MG tablet    Sig: Take 1 tablet by mouth every 12 (twelve) hours as needed for moderate pain.    Dispense:  60 tablet    Refill:  0   HYDROcodone-acetaminophen (NORCO) 7.5-325 MG tablet    Sig: Take 1 tablet by mouth every 12 (twelve) hours as needed  for moderate pain.    Dispense:  60 tablet    Refill:  0   HYDROcodone-acetaminophen (NORCO) 7.5-325 MG tablet    Sig: Take 1 tablet by mouth every 12 (twelve) hours as needed for moderate pain.    Dispense:  60 tablet    Refill:  0   Orders Placed This Encounter  Procedures   ToxASSURE Select 13 (MW), Urine    Volume: 30 ml(s). Minimum 3 ml of urine is  needed. Document temperature of fresh sample. Indications: Long term (current) use of opiate analgesic [phone])    Order Specific Question:   Release to patient    Answer:   Immediate     Follow-up plan:   Return in about 3 months (around 09/06/2022) for Medication Management, in person.   Recent Visits Date Type Provider Dept  03/14/22 Office Visit Andres Santa, MD [facility]  Showing recent visits within past 90 days and meeting all other requirements Today's Visits Date Type Provider Dept  06/06/22 Office Visit Andres Santa, MD [facility]  Showing today's visits and meeting all other requirements Future Appointments No visits were found meeting these conditions. Showing future appointments within next 90 days and meeting all other requirements  I discussed the assessment and treatment plan with the patient. The patient was provided an opportunity to ask questions and all were answered. The patient agreed with the plan and demonstrated an understanding of the instructions.  Patient advised to call back or seek an in-person evaluation if the symptoms or condition worsens.  Duration of encounter:30 minutes.  Note by: Andres Santa, MD Date: 06/06/2022; Time: 12:00 PM

## 2022-06-06 NOTE — Progress Notes (Signed)
Nursing Pain Medication Assessment:  Safety precautions to be maintained throughout the outpatient stay will include: orient to surroundings, keep bed in low position, maintain call bell within reach at all times, provide assistance with transfer out of bed and ambulation.  Medication Inspection Compliance: Andres Torres did not comply with our request to bring his pills to be counted. He was reminded that bringing the medication bottles, even when empty, is a requirement.  Medication: None brought in. Pill/Patch Count: None available to be counted. Bottle Appearance: No container available. Did not bring bottle(s) to appointment. Filled Date: N/A Last Medication intake:  Yesterday

## 2022-06-13 LAB — TOXASSURE SELECT 13 (MW), URINE

## 2022-08-31 ENCOUNTER — Encounter: Payer: Self-pay | Admitting: Student in an Organized Health Care Education/Training Program

## 2022-08-31 ENCOUNTER — Ambulatory Visit
Payer: Medicare Other | Attending: Student in an Organized Health Care Education/Training Program | Admitting: Student in an Organized Health Care Education/Training Program

## 2022-08-31 VITALS — BP 159/72 | HR 74 | Temp 97.3°F | Resp 17 | Ht 71.0 in | Wt 174.2 lb

## 2022-08-31 DIAGNOSIS — M25562 Pain in left knee: Secondary | ICD-10-CM | POA: Diagnosis present

## 2022-08-31 DIAGNOSIS — M5412 Radiculopathy, cervical region: Secondary | ICD-10-CM | POA: Diagnosis present

## 2022-08-31 DIAGNOSIS — M47816 Spondylosis without myelopathy or radiculopathy, lumbar region: Secondary | ICD-10-CM | POA: Diagnosis present

## 2022-08-31 DIAGNOSIS — G894 Chronic pain syndrome: Secondary | ICD-10-CM | POA: Insufficient documentation

## 2022-08-31 DIAGNOSIS — M1712 Unilateral primary osteoarthritis, left knee: Secondary | ICD-10-CM | POA: Diagnosis present

## 2022-08-31 DIAGNOSIS — M1612 Unilateral primary osteoarthritis, left hip: Secondary | ICD-10-CM | POA: Diagnosis present

## 2022-08-31 DIAGNOSIS — G8929 Other chronic pain: Secondary | ICD-10-CM | POA: Diagnosis present

## 2022-08-31 DIAGNOSIS — M47812 Spondylosis without myelopathy or radiculopathy, cervical region: Secondary | ICD-10-CM | POA: Insufficient documentation

## 2022-08-31 DIAGNOSIS — M5136 Other intervertebral disc degeneration, lumbar region: Secondary | ICD-10-CM | POA: Insufficient documentation

## 2022-08-31 MED ORDER — OXYCODONE HCL 5 MG PO TABS: 5.0000 mg | ORAL_TABLET | Freq: Two times a day (BID) | ORAL | 0 refills | Status: AC | PRN

## 2022-08-31 MED ORDER — OXYCODONE HCL 5 MG PO TABS: 5.0000 mg | ORAL_TABLET | Freq: Two times a day (BID) | ORAL | 0 refills | Status: DC | PRN

## 2022-08-31 NOTE — Progress Notes (Signed)
Nursing Pain Medication Assessment:  Safety precautions to be maintained throughout the outpatient stay will include: orient to surroundings, keep bed in low position, maintain call bell within reach at all times, provide assistance with transfer out of bed and ambulation.  Medication Inspection Compliance: Pill count conducted under aseptic conditions, in front of the patient. Neither the pills nor the bottle was removed from the patient's sight at any time. Once count was completed pills were immediately returned to the patient in their original bottle.  Medication: Hydrocodone/APAP Pill/Patch Count:  6 of 60 pills remain Pill/Patch Appearance: Markings consistent with prescribed medication Bottle Appearance: Standard pharmacy container. Clearly labeled. Filled Date: 09 / 29 / 2023 Last Medication intake:  Yesterday

## 2022-08-31 NOTE — Progress Notes (Signed)
PROVIDER NOTE: Information contained herein reflects review and annotations entered in association with encounter. Interpretation of such information and data should be left to medically-trained personnel. Information provided to patient can be located elsewhere in the medical record under "Patient Instructions". Document created using STT-dictation technology, any transcriptional errors that may result from process are unintentional.    Patient: Andres Torres  Service Category: E/M  Provider: Gillis Santa, MD  DOB: [birth year]  DOS: 08/31/2022  Specialty: Interventional Pain Management  MRN: [id number]  Setting: Ambulatory outpatient  PCP: Freddy Finner, NP  Type: Established Patient    Referring Provider: Freddy Finner, NP  Location: Office  Delivery: Face-to-face     HPI  [name], a 71 y.o. year old male, is here today because of his Chronic pain of left knee [M25.562, G89.29]. [name] primary complain today is Back Pain (lower) and Hip Pain (left)  Last encounter: My last encounter with him was on 06/06/22  Pertinent problems: [name] has Localized osteoarthrosis of left hip; Foot arch pain; Chronic pain syndrome; Lumbar degenerative disc disease; Chronic right shoulder pain; Lumbar spondylosis; Lumbar facet joint pain; and Cervical facet joint syndrome on their pertinent problem list. Pain Assessment: Severity of Chronic pain is reported as a 7 /10. Location: Back Lower/left hip. Onset: More than a month ago. Quality: Larence Penning. Timing: Constant. Modifying factor(s): meds. Vitals:  height is '5\' 11"'  (1.803 m) and weight is 174 lb 3.2 oz (79 kg). His temporal temperature is 97.3 F (36.3 C) (abnormal). His blood pressure is 159/72 (abnormal) and his pulse is 74. His respiration is 17 and oxygen saturation is 99%.   Reason for encounter: medication management.    Patient follows up today for medication management.  Opioid rotation from hydrocodone to oxycodone.  We usually do  an opioid rotation every 3-6 months months to help with tolerance.  Opioid rotation to Oxycodone 5 mg BID prn   Pharmacotherapy Assessment  Analgesic: Oxycodone 5 mg twice daily as needed, quantity 60/month   Monitoring: [employer] PMP: PDMP reviewed during this encounter.       Pharmacotherapy: No side-effects or adverse reactions reported. Compliance: No problems identified. Effectiveness: Clinically acceptable.  UDS:  Summary  Date Value Ref Range Status  06/06/2022 Note  Final    Comment:    ==================================================================== [facility] (MW) ==================================================================== Test                             Result       Flag       Units  Drug Present and Declared for Prescription Verification   Oxycodone                      214          EXPECTED   ng/mg creat   Oxymorphone                    280          EXPECTED   ng/mg creat   Noroxycodone                   429          EXPECTED   ng/mg creat   Noroxymorphone                 142          EXPECTED   ng/mg  creat    Sources of oxycodone are scheduled prescription medications.    Oxymorphone, noroxycodone, and noroxymorphone are expected    metabolites of oxycodone. Oxymorphone is also available as a    scheduled prescription medication.  Drug Present not Declared for Prescription Verification   Carboxy-THC                    76           UNEXPECTED ng/mg creat    Carboxy-THC is a metabolite of tetrahydrocannabinol (THC). Source of    THC is most commonly herbal marijuana or marijuana-based products,    but THC is also present in a scheduled prescription medication.    Trace amounts of THC can be present in hemp and cannabidiol (CBD)    products. This test is not intended to distinguish between delta-9-    tetrahydrocannabinol, the predominant form of THC in most herbal or    marijuana-based products, and delta-8-tetrahydrocannabinol.  Drug Absent but  Declared for Prescription Verification   Hydrocodone                    Not Detected UNEXPECTED ng/mg creat ==================================================================== Test                      Result    Flag   Units      Ref Range   Creatinine              153              mg/dL      >=20 ==================================================================== Declared Medications:  The flagging and interpretation on this report are based on the  following declared medications.  Unexpected results may arise from  inaccuracies in the declared medications.   **Note: The testing scope of this panel includes these medications:   Hydrocodone (Norco)  Oxycodone   **Note: The testing scope of this panel does not include the  following reported medications:   Acetaminophen (Norco)  Albuterol (Ventolin HFA)  Atorvastatin (Lipitor)  Multivitamin  Trazodone (Desyrel)  Ubiquinone (CoQ10)  Vitamin C ==================================================================== For clinical consultation, please call [phone]. ====================================================================       ROS  Constitutional: Denies any fever or chills Gastrointestinal: No reported hemesis, hematochezia, vomiting, or acute GI distress Musculoskeletal:  Left knee pain, low back pain Neurological: No reported episodes of acute onset apraxia, aphasia, dysarthria, agnosia, amnesia, paralysis, loss of coordination, or loss of consciousness  Medication Review  CoQ10, HYDROcodone-acetaminophen, albuterol, atorvastatin, glucose blood, multivitamin with minerals, oxyCODONE, traZODone, and vitamin C  History Review  Allergy: [name] has No Known Allergies. Drug: [name]  reports that he does not currently use drugs. Alcohol:  reports that he does not currently use alcohol. Tobacco:  reports that he quit smoking about 4 years ago. His smoking use included cigarettes. He has never used  smokeless tobacco. Social: [name]  reports that he quit smoking about 4 years ago. His smoking use included cigarettes. He has never used smokeless tobacco. He reports that he does not currently use alcohol. He reports that he does not currently use drugs. Medical:  has a past medical history of Diabetes mellitus without complication ([place]) and Sick sinus syndrome ([place]). Surgical: [name]  has a past surgical history that includes PACEMAKER LEADLESS INSERTION (N/A, 08/28/2019) and TEMPORARY PACEMAKER (Right, 08/28/2019). Family: family history includes Arthritis in his mother; Diabetes in his father; Kidney disease in his father.  Laboratory Chemistry Profile   Renal Lab Results  Component Value Date   BUN 19 08/29/2019   CREATININE 1.19 08/29/2019   BCR 10 04/23/2018   GFRAA >60 08/29/2019   GFRNONAA >60 08/29/2019     Hepatic Lab Results  Component Value Date   AST 31 08/09/2019   ALT 43 08/09/2019   ALBUMIN 4.3 08/09/2019   ALKPHOS 56 08/09/2019     Electrolytes Lab Results  Component Value Date   NA 137 08/29/2019   K 4.7 08/29/2019   CL 103 08/29/2019   CALCIUM 9.0 08/29/2019   MG 2.2 08/10/2019   PHOS 3.8 08/10/2019     Bone No results found for: "VD25OH", "VD125OH2TOT", "TR7116FB9", "UX8333OV2", "25OHVITD1", "25OHVITD2", "25OHVITD3", "TESTOFREE", "TESTOSTERONE"   Inflammation (CRP: Acute Phase) (ESR: Chronic Phase) No results found for: "CRP", "ESRSEDRATE", "LATICACIDVEN"     Note: Above Lab results reviewed.   Physical Exam  General appearance: Well nourished, well developed, and well hydrated. In no apparent acute distress Mental status: Alert, oriented x 3 (person, place, & time)       Respiratory: No evidence of acute respiratory distress Eyes: PERLA Vitals: BP (!) 159/72 (BP Location: Right Arm, Patient Position: Sitting, Cuff Size: Normal)   Pulse 74   Temp (!) 97.3 F (36.3 C) (Temporal)   Resp 17   Ht '5\' 11"'  (1.803 m)   Wt 174 lb 3.2 oz (79  kg)   SpO2 99%   BMI 24.30 kg/m  BMI: Estimated body mass index is 24.3 kg/m as calculated from the following:   Height as of this encounter: '5\' 11"'  (1.803 m).   Weight as of this encounter: 174 lb 3.2 oz (79 kg). Ideal: Ideal body weight: 75.3 kg (166 lb 0.1 oz) Adjusted ideal body weight: 76.8 kg (169 lb 4.5 oz)   Gait & Posture Assessment  Ambulation: Unassisted Gait: Relatively normal for age and body habitus Posture: WNL  Lower Extremity Exam    Side: Right lower extremity  Side: Left lower extremity  Stability: No instability observed          Stability: No instability observed          Skin & Extremity Inspection: Skin color, temperature, and hair growth are WNL. No peripheral edema or cyanosis. No masses, redness, swelling, asymmetry, or associated skin lesions. No contractures.  Skin & Extremity Inspection: Skin color, temperature, and hair growth are WNL. No peripheral edema or cyanosis. No masses, redness, swelling, asymmetry, or associated skin lesions. No contractures.  Functional ROM: Unrestricted ROM                  Functional ROM: Pain restricted ROM for hip and knee joints          Muscle Tone/Strength: Functionally intact. No obvious neuro-muscular anomalies detected.  Muscle Tone/Strength: Functionally intact. No obvious neuro-muscular anomalies detected.  Sensory (Neurological): Unimpaired        Sensory (Neurological): Arthropathic arthralgia        DTR: Patellar: deferred today Achilles: deferred today Plantar: deferred today  DTR: Patellar: deferred today Achilles: deferred today Plantar: deferred today  Palpation: No palpable anomalies  Palpation: No palpable anomalies    Assessment   Status Diagnosis  Controlled Controlled Controlled 1. Chronic pain of left knee   2. Arthritis of left knee   3. Cervical radiculopathy   4. Lumbar spondylosis   5. Cervical facet joint syndrome   6. Localized osteoarthrosis of left hip   7. Lumbar degenerative disc  disease   8. Chronic pain syndrome        Plan of Care   [name] has a current medication list which includes the following long-term medication(s): atorvastatin, trazodone, and ventolin hfa.  Pharmacotherapy (Medications Ordered): Meds ordered this encounter  Medications   oxyCODONE (OXY IR/ROXICODONE) 5 MG immediate release tablet    Sig: Take 1 tablet (5 mg total) by mouth every 12 (twelve) hours as needed for severe pain. Must last 30 days.    Dispense:  60 tablet    Refill:  0    Chronic Pain: STOP Act (Not applicable) Fill 1 day early if closed on refill date. Avoid benzodiazepines within 8 hours of opioids   oxyCODONE (OXY IR/ROXICODONE) 5 MG immediate release tablet    Sig: Take 1 tablet (5 mg total) by mouth every 12 (twelve) hours as needed for severe pain. Must last 30 days.    Dispense:  60 tablet    Refill:  0    Chronic Pain: STOP Act (Not applicable) Fill 1 day early if closed on refill date. Avoid benzodiazepines within 8 hours of opioids   oxyCODONE (OXY IR/ROXICODONE) 5 MG immediate release tablet    Sig: Take 1 tablet (5 mg total) by mouth every 12 (twelve) hours as needed for severe pain. Must last 30 days.    Dispense:  60 tablet    Refill:  0    Chronic Pain: STOP Act (Not applicable) Fill 1 day early if closed on refill date. Avoid benzodiazepines within 8 hours of opioids   No orders of the defined types were placed in this encounter.    Follow-up plan:   Return in about 3 months (around 12/01/2022) for Medication Management, in person.   Recent Visits Date Type Provider Dept  06/06/22 Office Visit Gillis Santa, MD [facility]  Showing recent visits within past 90 days and meeting all other requirements Today's Visits Date Type Provider Dept  08/31/22 Office Visit Gillis Santa, MD [facility]  Showing today's visits and meeting all other requirements Future Appointments Date Type Provider Dept  11/28/22 Appointment  Gillis Santa, MD [facility]  Showing future appointments within next 90 days and meeting all other requirements  I discussed the assessment and treatment plan with the patient. The patient was provided an opportunity to ask questions and all were answered. The patient agreed with the plan and demonstrated an understanding of the instructions.  Patient advised to call back or seek an in-person evaluation if the symptoms or condition worsens.  Duration of encounter:30 minutes.  Note by: Gillis Santa, MD Date: 08/31/2022; Time: 11:36 AM

## 2022-09-01 ENCOUNTER — Telehealth: Payer: Self-pay | Admitting: Student in an Organized Health Care Education/Training Program

## 2022-09-01 NOTE — Telephone Encounter (Signed)
Received an automated message and I left a message for the pharmacist to call back to office to discuss medication dates.

## 2022-09-01 NOTE — Telephone Encounter (Signed)
Pharmacy stated that they wanted to confirm patient prescription. It's date for an week early that wanted to make sure before filling the prescription. Pharmacy didn't want patient to have to many pills. Pharmacy also stated that patient hadn't call to refilled prescription yet. Just wanted to confirm with doctor about dates. Please give pharmacy a call. Thanks

## 2022-11-28 ENCOUNTER — Ambulatory Visit
Payer: Medicare Other | Attending: Student in an Organized Health Care Education/Training Program | Admitting: Student in an Organized Health Care Education/Training Program

## 2022-11-28 ENCOUNTER — Encounter: Payer: Self-pay | Admitting: Student in an Organized Health Care Education/Training Program

## 2022-11-28 VITALS — BP 186/84 | HR 78 | Temp 97.1°F | Ht 71.0 in | Wt 174.0 lb

## 2022-11-28 DIAGNOSIS — M5459 Other low back pain: Secondary | ICD-10-CM

## 2022-11-28 DIAGNOSIS — M47812 Spondylosis without myelopathy or radiculopathy, cervical region: Secondary | ICD-10-CM | POA: Diagnosis present

## 2022-11-28 DIAGNOSIS — M5412 Radiculopathy, cervical region: Secondary | ICD-10-CM

## 2022-11-28 DIAGNOSIS — M1612 Unilateral primary osteoarthritis, left hip: Secondary | ICD-10-CM

## 2022-11-28 DIAGNOSIS — M5136 Other intervertebral disc degeneration, lumbar region: Secondary | ICD-10-CM | POA: Diagnosis present

## 2022-11-28 DIAGNOSIS — G894 Chronic pain syndrome: Secondary | ICD-10-CM

## 2022-11-28 DIAGNOSIS — M51369 Other intervertebral disc degeneration, lumbar region without mention of lumbar back pain or lower extremity pain: Secondary | ICD-10-CM

## 2022-11-28 DIAGNOSIS — M47816 Spondylosis without myelopathy or radiculopathy, lumbar region: Secondary | ICD-10-CM | POA: Diagnosis present

## 2022-11-28 MED ORDER — HYDROCODONE-ACETAMINOPHEN 7.5-325 MG PO TABS: 1.0000 | ORAL_TABLET | Freq: Four times a day (QID) | ORAL | 0 refills | Status: DC | PRN

## 2022-11-28 MED ORDER — HYDROCODONE-ACETAMINOPHEN 7.5-325 MG PO TABS: 1.0000 | ORAL_TABLET | Freq: Four times a day (QID) | ORAL | 0 refills | Status: AC | PRN

## 2022-11-28 NOTE — Progress Notes (Signed)
PROVIDER NOTE: Information contained herein reflects review and annotations entered in association with encounter. Interpretation of such information and data should be left to medically-trained personnel. Information provided to patient can be located elsewhere in the medical record under "Patient Instructions". Document created using STT-dictation technology, any transcriptional errors that may result from process are unintentional.    Patient: Andres Torres  Service Category: E/M  Provider: Levert Jolly, MD  DOB: 03-Mar-1951  DOS: 11/28/2022  Specialty: Interventional Pain Management  MRN: [id number]  Setting: Ambulatory outpatient  PCP: Sandrea Hughs, NP  Type: Established Patient    Referring Provider: Sandrea Hughs, NP  Location: Office  Delivery: Face-to-face     HPI  [name], a 72 y.o. year old male, is here today because of his Cervical radiculopathy [M54.12]. [name] primary complain today is Hip Pain (left)  Last encounter: My last encounter with him was on 08/31/22  Pertinent problems: [name] has Localized osteoarthrosis of left hip; Foot arch pain; Chronic pain syndrome; Lumbar degenerative disc disease; Chronic right shoulder pain; Lumbar spondylosis; Lumbar facet joint pain; and Cervical facet joint syndrome on their pertinent problem list. Pain Assessment: Severity of Chronic pain is reported as a 8 /10. Location: Hip Right/Denies. Onset: More than a month ago. Quality: Stabbing, Sharp, Throbbing, Burning, Constant, Aching. Timing: Constant. Modifying factor(s): Meds and laying down. Vitals:  height is 5\' 11"  (1.803 m) and weight is 174 lb (78.9 kg). His temperature is 97.1 F (36.2 C) (abnormal). His blood pressure is 186/84 (abnormal) and his pulse is 78. His oxygen saturation is 99%.   Reason for encounter: medication management.    Patient follows up today for medication management.  Opioid rotation from oxycodone to hydrocodone.  We usually do an opioid  rotation every 3-6 months months to help with tolerance.   Experiencing URI symptoms at this time.  Pharmacotherapy Assessment  Analgesic: Hydrocodone 7.5 mg twice daily as needed , quantity 60/month   Monitoring: [place] PMP: PDMP not reviewed this encounter.       Pharmacotherapy: No side-effects or adverse reactions reported. Compliance: No problems identified. Effectiveness: Clinically acceptable.  UDS:  Summary  Date Value Ref Range Status  06/06/2022 Note  Final    Comment:    ==================================================================== [facility] (MW) ==================================================================== Test                             Result       Flag       Units  Drug Present and Declared for Prescription Verification   Oxycodone                      214          EXPECTED   ng/mg creat   Oxymorphone                    280          EXPECTED   ng/mg creat   Noroxycodone                   429          EXPECTED   ng/mg creat   Noroxymorphone                 142          EXPECTED   ng/mg creat    Sources of oxycodone are scheduled prescription  medications.    Oxymorphone, noroxycodone, and noroxymorphone are expected    metabolites of oxycodone. Oxymorphone is also available as a    scheduled prescription medication.  Drug Present not Declared for Prescription Verification   Carboxy-THC                    76           UNEXPECTED ng/mg creat    Carboxy-THC is a metabolite of tetrahydrocannabinol (THC). Source of    THC is most commonly herbal marijuana or marijuana-based products,    but THC is also present in a scheduled prescription medication.    Trace amounts of THC can be present in hemp and cannabidiol (CBD)    products. This test is not intended to distinguish between delta-9-    tetrahydrocannabinol, the predominant form of THC in most herbal or    marijuana-based products, and delta-8-tetrahydrocannabinol.  Drug Absent but Declared for  Prescription Verification   Hydrocodone                    Not Detected UNEXPECTED ng/mg creat ==================================================================== Test                      Result    Flag   Units      Ref Range   Creatinine              153              mg/dL      >=44 ==================================================================== Declared Medications:  The flagging and interpretation on this report are based on the  following declared medications.  Unexpected results may arise from  inaccuracies in the declared medications.   **Note: The testing scope of this panel includes these medications:   Hydrocodone (Norco)  Oxycodone   **Note: The testing scope of this panel does not include the  following reported medications:   Acetaminophen (Norco)  Albuterol (Ventolin HFA)  Atorvastatin (Lipitor)  Multivitamin  Trazodone (Desyrel)  Ubiquinone (CoQ10)  Vitamin C ==================================================================== For clinical consultation, please call [phone]. ====================================================================       ROS  Constitutional: Denies any fever or chills Gastrointestinal: No reported hemesis, hematochezia, vomiting, or acute GI distress Musculoskeletal:  Left knee pain, low back pain Neurological: No reported episodes of acute onset apraxia, aphasia, dysarthria, agnosia, amnesia, paralysis, loss of coordination, or loss of consciousness  Medication Review  CoQ10, HYDROcodone-acetaminophen, albuterol, atorvastatin, glucose blood, multivitamin with minerals, traZODone, and vitamin C  History Review  Allergy: [name] has No Known Allergies. Drug: [name]  reports that he does not currently use drugs. Alcohol:  reports that he does not currently use alcohol. Tobacco:  reports that he quit smoking about 4 years ago. His smoking use included cigarettes. He has never used smokeless tobacco. Social: Mr.  Andres Torres  reports that he quit smoking about 4 years ago. His smoking use included cigarettes. He has never used smokeless tobacco. He reports that he does not currently use alcohol. He reports that he does not currently use drugs. Medical:  has a past medical history of Diabetes mellitus without complication (HCC) and Sick sinus syndrome (HCC). Surgical: Mr. Si  has a past surgical history that includes PACEMAKER LEADLESS INSERTION (N/A, 08/28/2019) and TEMPORARY PACEMAKER (Right, 08/28/2019). Family: family history includes Arthritis in his mother; Diabetes in his father; Kidney disease in his father.  Laboratory Chemistry Profile   Renal Lab Results  Component Value  Date   BUN 19 08/29/2019   CREATININE 1.19 08/29/2019   BCR 10 04/23/2018   GFRAA >60 08/29/2019   GFRNONAA >60 08/29/2019     Hepatic Lab Results  Component Value Date   AST 31 08/09/2019   ALT 43 08/09/2019   ALBUMIN 4.3 08/09/2019   ALKPHOS 56 08/09/2019     Electrolytes Lab Results  Component Value Date   NA 137 08/29/2019   K 4.7 08/29/2019   CL 103 08/29/2019   CALCIUM 9.0 08/29/2019   MG 2.2 08/10/2019   PHOS 3.8 08/10/2019     Bone No results found for: "VD25OH", "VD125OH2TOT", "YQ6578IO9", "GE9528UX3", "25OHVITD1", "25OHVITD2", "25OHVITD3", "TESTOFREE", "TESTOSTERONE"   Inflammation (CRP: Acute Phase) (ESR: Chronic Phase) No results found for: "CRP", "ESRSEDRATE", "LATICACIDVEN"     Note: Above Lab results reviewed.   Physical Exam  General appearance: Well nourished, well developed, and well hydrated. In no apparent acute distress Mental status: Alert, oriented x 3 (person, place, & time)       Respiratory: No evidence of acute respiratory distress Eyes: PERLA Vitals: BP (!) 186/84   Pulse 78   Temp (!) 97.1 F (36.2 C)   Ht 5\' 11"  (1.803 m)   Wt 174 lb (78.9 kg)   SpO2 99%   BMI 24.27 kg/m  BMI: Estimated body mass index is 24.27 kg/m as calculated from the following:   Height as  of this encounter: 5\' 11"  (1.803 m).   Weight as of this encounter: 174 lb (78.9 kg). Ideal: Ideal body weight: 75.3 kg (166 lb 0.1 oz) Adjusted ideal body weight: 76.8 kg (169 lb 3.3 oz)   Gait & Posture Assessment  Ambulation: Unassisted Gait: Relatively normal for age and body habitus Posture: WNL  Lower Extremity Exam    Side: Right lower extremity  Side: Left lower extremity  Stability: No instability observed          Stability: No instability observed          Skin & Extremity Inspection: Skin color, temperature, and hair growth are WNL. No peripheral edema or cyanosis. No masses, redness, swelling, asymmetry, or associated skin lesions. No contractures.  Skin & Extremity Inspection: Skin color, temperature, and hair growth are WNL. No peripheral edema or cyanosis. No masses, redness, swelling, asymmetry, or associated skin lesions. No contractures.  Functional ROM: Unrestricted ROM                  Functional ROM: Pain restricted ROM for hip and knee joints          Muscle Tone/Strength: Functionally intact. No obvious neuro-muscular anomalies detected.  Muscle Tone/Strength: Functionally intact. No obvious neuro-muscular anomalies detected.  Sensory (Neurological): Unimpaired        Sensory (Neurological): Arthropathic arthralgia        DTR: Patellar: deferred today Achilles: deferred today Plantar: deferred today  DTR: Patellar: deferred today Achilles: deferred today Plantar: deferred today  Palpation: No palpable anomalies  Palpation: No palpable anomalies    Assessment   Status Diagnosis  Controlled Controlled Controlled 1. Cervical radiculopathy   2. Lumbar spondylosis   3. Cervical facet joint syndrome   4. Localized osteoarthrosis of left hip   5. Lumbar degenerative disc disease   6. Lumbar facet joint pain   7. Chronic pain syndrome        Plan of Care   Mr. Creek Gan has a current medication list which includes the following long-term  medication(s): atorvastatin, trazodone, and ventolin hfa.  Pharmacotherapy (Medications Ordered): Meds ordered this encounter  Medications   HYDROcodone-acetaminophen (NORCO) 7.5-325 MG tablet    Sig: Take 1 tablet by mouth every 6 (six) hours as needed for severe pain. Must last 30 days.    Dispense:  60 tablet    Refill:  0    Chronic Pain: STOP Act (Not applicable) Fill 1 day early if closed on refill date. Avoid benzodiazepines within 8 hours of opioids   HYDROcodone-acetaminophen (NORCO) 7.5-325 MG tablet    Sig: Take 1 tablet by mouth every 6 (six) hours as needed for severe pain. Must last 30 days.    Dispense:  60 tablet    Refill:  0    Chronic Pain: STOP Act (Not applicable) Fill 1 day early if closed on refill date. Avoid benzodiazepines within 8 hours of opioids   HYDROcodone-acetaminophen (NORCO) 7.5-325 MG tablet    Sig: Take 1 tablet by mouth every 6 (six) hours as needed for severe pain. Must last 30 days.    Dispense:  60 tablet    Refill:  0    Chronic Pain: STOP Act (Not applicable) Fill 1 day early if closed on refill date. Avoid benzodiazepines within 8 hours of opioids   No orders of the defined types were placed in this encounter.    Follow-up plan:   Return in about 3 months (around 02/27/2023) for Medication Management, in person.   Recent Visits Date Type Provider Dept  08/31/22 Office Visit Gillis Santa, MD [facility]  Showing recent visits within past 90 days and meeting all other requirements Today's Visits Date Type Provider Dept  11/28/22 Office Visit Gillis Santa, MD [facility]  Showing today's visits and meeting all other requirements Future Appointments No visits were found meeting these conditions. Showing future appointments within next 90 days and meeting all other requirements  I discussed the assessment and treatment plan with the patient. The patient was provided an opportunity to ask questions and all were  answered. The patient agreed with the plan and demonstrated an understanding of the instructions.  Patient advised to call back or seek an in-person evaluation if the symptoms or condition worsens.  Duration of encounter:30 minutes.  Note by: Gillis Santa, MD Date: 11/28/2022; Time: 3:16 PM

## 2022-11-28 NOTE — Progress Notes (Signed)
Nursing Pain Medication Assessment:  Safety precautions to be maintained throughout the outpatient stay will include: orient to surroundings, keep bed in low position, maintain call bell within reach at all times, provide assistance with transfer out of bed and ambulation.  Medication Inspection Compliance: Mr. Bolz did not comply with our request to bring his pills to be counted. He was reminded that bringing the medication bottles, even when empty, is a requirement.  Medication: None brought in. Pill/Patch Count: None available to be counted. Bottle Appearance: No container available. Did not bring bottle(s) to appointment. Filled Date: N/A Last Medication intake:  TodaySafety precautions to be maintained throughout the outpatient stay will include: orient to surroundings, keep bed in low position, maintain call bell within reach at all times, provide assistance with transfer out of bed and ambulation.

## 2023-03-01 ENCOUNTER — Ambulatory Visit
Payer: Medicare Other | Attending: Student in an Organized Health Care Education/Training Program | Admitting: Student in an Organized Health Care Education/Training Program

## 2023-03-01 ENCOUNTER — Encounter: Payer: Self-pay | Admitting: Student in an Organized Health Care Education/Training Program

## 2023-03-01 VITALS — BP 182/84 | HR 79 | Temp 97.6°F | Resp 18 | Ht 70.0 in | Wt 175.0 lb

## 2023-03-01 DIAGNOSIS — M5412 Radiculopathy, cervical region: Secondary | ICD-10-CM | POA: Diagnosis present

## 2023-03-01 DIAGNOSIS — M1612 Unilateral primary osteoarthritis, left hip: Secondary | ICD-10-CM

## 2023-03-01 DIAGNOSIS — M47812 Spondylosis without myelopathy or radiculopathy, cervical region: Secondary | ICD-10-CM | POA: Diagnosis present

## 2023-03-01 DIAGNOSIS — M5136 Other intervertebral disc degeneration, lumbar region: Secondary | ICD-10-CM | POA: Insufficient documentation

## 2023-03-01 DIAGNOSIS — G894 Chronic pain syndrome: Secondary | ICD-10-CM | POA: Diagnosis present

## 2023-03-01 MED ORDER — HYDROCODONE-ACETAMINOPHEN 10-325 MG PO TABS: 1.0000 | ORAL_TABLET | Freq: Two times a day (BID) | ORAL | 0 refills | Status: AC | PRN

## 2023-03-01 MED ORDER — HYDROCODONE-ACETAMINOPHEN 10-325 MG PO TABS: 1.0000 | ORAL_TABLET | Freq: Two times a day (BID) | ORAL | 0 refills | Status: DC | PRN

## 2023-03-01 NOTE — Progress Notes (Signed)
Nursing Pain Medication Assessment:  Safety precautions to be maintained throughout the outpatient stay will include: orient to surroundings, keep bed in low position, maintain call bell within reach at all times, provide assistance with transfer out of bed and ambulation.  Medication Inspection Compliance: Andres Torres did not comply with our request to bring his pills to be counted. He was reminded that bringing the medication bottles, even when empty, is a requirement.  Medication: None brought in. Pill/Patch Count: None available to be counted. Bottle Appearance: No container available. Did not bring bottle(s) to appointment. Filled Date: N/A Last Medication intake:   2 days ago Safety precautions to be maintained throughout the outpatient stay will include: orient to surroundings, keep bed in low position, maintain call bell within reach at all times, provide assistance with transfer out of bed and ambulation.

## 2023-03-01 NOTE — Progress Notes (Signed)
PROVIDER NOTE: Information contained herein reflects review and annotations entered in association with encounter. Interpretation of such information and data should be left to medically-trained personnel. Information provided to patient can be located elsewhere in the medical record under "Patient Instructions". Document created using STT-dictation technology, any transcriptional errors that may result from process are unintentional.    Patient: Andres Torres  Service Category: E/M  Provider: Chayson Jolly, MD  DOB: 1951/10/19  DOS: 03/01/2023  Specialty: Interventional Pain Management  MRN: [id number]  Setting: Ambulatory outpatient  PCP: Sandrea Hughs, NP  Type: Established Patient    Referring Provider: Sandrea Hughs, NP  Location: Office  Delivery: Face-to-face     HPI  [name], a 72 y.o. year old male, is here today because of his Chronic pain syndrome [G89.4]. Andres Torres primary complain today is Neck Pain and Hip Pain (left)  Last encounter: My last encounter with him was on 11/28/22  Pertinent problems: Andres Torres has Localized osteoarthrosis of left hip; Foot arch pain; Chronic pain syndrome; Lumbar degenerative disc disease; Chronic right shoulder pain; Lumbar spondylosis; Lumbar facet joint pain; and Cervical facet joint syndrome on their pertinent problem list. Pain Assessment: Severity of Chronic pain is reported as a 7 /10. Location: Hip Left/denies. Onset: More than a month ago. Quality: Aching. Timing: Constant. Modifying factor(s): medications, hot bath. Vitals:  height is  (1.778 m) and weight is 175 lb (79.4 kg). His temporal temperature is 97.6 F (36.4 C). His blood pressure is 182/84 (abnormal) and his pulse is 79. His respiration is 18 and oxygen saturation is 100%.   Reason for encounter: medication management.    No change in medical history since last visit.  Increased pain of his cervical spine and left hip pain.  Patient continues multimodal pain regimen  as prescribed.  States that it provides some pain relief but is requesting to increase dose to 10 mg.  Pharmacotherapy Assessment  Analgesic: Hydrocodone 7.5 mg twice daily as needed , quantity 60/month   Monitoring:  PMP: PDMP reviewed during this encounter.       Pharmacotherapy: No side-effects or adverse reactions reported. Compliance: No problems identified. Effectiveness: Clinically acceptable.  UDS:  Summary  Date Value Ref Range Status  06/06/2022 Note  Final    Comment:    ==================================================================== [employer] (MW) ==================================================================== Test                             Result       Flag       Units  Drug Present and Declared for Prescription Verification   Oxycodone                      214          EXPECTED   ng/mg creat   Oxymorphone                    280          EXPECTED   ng/mg creat   Noroxycodone                   429          EXPECTED   ng/mg creat   Noroxymorphone                 142          EXPECTED   ng/mg creat  Sources of oxycodone are scheduled prescription medications.    Oxymorphone, noroxycodone, and noroxymorphone are expected    metabolites of oxycodone. Oxymorphone is also available as a    scheduled prescription medication.  Drug Present not Declared for Prescription Verification   Carboxy-THC                    76           UNEXPECTED ng/mg creat    Carboxy-THC is a metabolite of tetrahydrocannabinol (THC). Source of    THC is most commonly herbal marijuana or marijuana-based products,    but THC is also present in a scheduled prescription medication.    Trace amounts of THC can be present in hemp and cannabidiol (CBD)    products. This test is not intended to distinguish between delta-9-    tetrahydrocannabinol, the predominant form of THC in most herbal or    marijuana-based products, and delta-8-tetrahydrocannabinol.  Drug Absent but Declared  for Prescription Verification   Hydrocodone                    Not Detected UNEXPECTED ng/mg creat ==================================================================== Test                      Result    Flag   Units      Ref Range   Creatinine              153              mg/dL      >=14 ==================================================================== Declared Medications:  The flagging and interpretation on this report are based on the  following declared medications.  Unexpected results may arise from  inaccuracies in the declared medications.   **Note: The testing scope of this panel includes these medications:   Hydrocodone (Norco)  Oxycodone   **Note: The testing scope of this panel does not include the  following reported medications:   Acetaminophen (Norco)  Albuterol (Ventolin HFA)  Atorvastatin (Lipitor)  Multivitamin  Trazodone (Desyrel)  Ubiquinone (CoQ10)  Vitamin C ==================================================================== For clinical consultation, please call [phone]. ====================================================================       ROS  Constitutional: Denies any fever or chills Gastrointestinal: No reported hemesis, hematochezia, vomiting, or acute GI distress Musculoskeletal:  Left knee pain, low back pain Neurological: No reported episodes of acute onset apraxia, aphasia, dysarthria, agnosia, amnesia, paralysis, loss of coordination, or loss of consciousness  Medication Review  CoQ10, HYDROcodone-acetaminophen, albuterol, atorvastatin, glucose blood, multivitamin with minerals, sitaGLIPtin, traZODone, and vitamin C  History Review  Allergy: Andres Torres has No Known Allergies. Drug: Andres Torres  reports that he does not currently use drugs. Alcohol:  reports that he does not currently use alcohol. Tobacco:  reports that he quit smoking about 4 years ago. His smoking use included cigarettes. He has never used smokeless  tobacco. Social: Andres Torres  reports that he quit smoking about 4 years ago. His smoking use included cigarettes. He has never used smokeless tobacco. He reports that he does not currently use alcohol. He reports that he does not currently use drugs. Medical:  has a past medical history of Diabetes mellitus without complication and Sick sinus syndrome. Surgical: Andres Torres  has a past surgical history that includes PACEMAKER LEADLESS INSERTION (N/A, 08/28/2019) and TEMPORARY PACEMAKER (Right, 08/28/2019). Family: family history includes Arthritis in his mother; Diabetes in his father; Kidney disease in his father.  Laboratory Chemistry Profile   Renal  Lab Results  Component Value Date   BUN 19 08/29/2019   CREATININE 1.19 08/29/2019   BCR 10 04/23/2018   GFRAA >60 08/29/2019   GFRNONAA >60 08/29/2019     Hepatic Lab Results  Component Value Date   AST 31 08/09/2019   ALT 43 08/09/2019   ALBUMIN 4.3 08/09/2019   ALKPHOS 56 08/09/2019     Electrolytes Lab Results  Component Value Date   NA 137 08/29/2019   K 4.7 08/29/2019   CL 103 08/29/2019   CALCIUM 9.0 08/29/2019   MG 2.2 08/10/2019   PHOS 3.8 08/10/2019     Bone No results found for: "VD25OH", "VD125OH2TOT", "WU9811BJ4", "NW2956OZ3", "25OHVITD1", "25OHVITD2", "25OHVITD3", "TESTOFREE", "TESTOSTERONE"   Inflammation (CRP: Acute Phase) (ESR: Chronic Phase) No results found for: "CRP", "ESRSEDRATE", "LATICACIDVEN"     Note: Above Lab results reviewed.   Physical Exam  General appearance: Well nourished, well developed, and well hydrated. In no apparent acute distress Mental status: Alert, oriented x 3 (person, place, & time)       Respiratory: No evidence of acute respiratory distress Eyes: PERLA Vitals: BP (!) 182/84   Pulse 79   Temp 97.6 F (36.4 C) (Temporal)   Resp 18   Ht 5\' 10"  (1.778 m)   Wt 175 lb (79.4 kg)   SpO2 100%   BMI 25.11 kg/m  BMI: Estimated body mass index is 25.11 kg/m as calculated  from the following:   Height as of this encounter: 5\' 10"  (1.778 m).   Weight as of this encounter: 175 lb (79.4 kg). Ideal: Ideal body weight: 73 kg (160 lb 15 oz) Adjusted ideal body weight: 75.6 kg (166 lb 9 oz)   C-spine pain   Bilateral hip pain  Gait & Posture Assessment  Ambulation: Unassisted Gait: Relatively normal for age and body habitus Posture: WNL  Lower Extremity Exam    Side: Right lower extremity  Side: Left lower extremity  Stability: No instability observed          Stability: No instability observed          Skin & Extremity Inspection: Skin color, temperature, and hair growth are WNL. No peripheral edema or cyanosis. No masses, redness, swelling, asymmetry, or associated skin lesions. No contractures.  Skin & Extremity Inspection: Skin color, temperature, and hair growth are WNL. No peripheral edema or cyanosis. No masses, redness, swelling, asymmetry, or associated skin lesions. No contractures.  Functional ROM: Unrestricted ROM                  Functional ROM: Pain restricted ROM for hip and knee joints          Muscle Tone/Strength: Functionally intact. No obvious neuro-muscular anomalies detected.  Muscle Tone/Strength: Functionally intact. No obvious neuro-muscular anomalies detected.  Sensory (Neurological): Unimpaired        Sensory (Neurological): Arthropathic arthralgia        DTR: Patellar: deferred today Achilles: deferred today Plantar: deferred today  DTR: Patellar: deferred today Achilles: deferred today Plantar: deferred today  Palpation: No palpable anomalies  Palpation: No palpable anomalies    Assessment   Status Diagnosis  Controlled Having a Flare-up Having a Flare-up 1. Chronic pain syndrome   2. Cervical radiculopathy   3. Cervical facet joint syndrome   4. Lumbar degenerative disc disease   5. Localized osteoarthrosis of left hip        Plan of Care   Andres Torres has a current medication list which includes the  following long-term medication(s): atorvastatin, trazodone, and ventolin hfa.  Pharmacotherapy (Medications Ordered): Meds ordered this encounter  Medications   HYDROcodone-acetaminophen (NORCO) 10-325 MG tablet    Sig: Take 1 tablet by mouth every 12 (twelve) hours as needed for severe pain. Must last 30 days.    Dispense:  60 tablet    Refill:  0    Chronic Pain: STOP Act (Not applicable) Fill 1 day early if closed on refill date. Avoid benzodiazepines within 8 hours of opioids   HYDROcodone-acetaminophen (NORCO) 10-325 MG tablet    Sig: Take 1 tablet by mouth every 12 (twelve) hours as needed for severe pain. Must last 30 days.    Dispense:  60 tablet    Refill:  0    Chronic Pain: STOP Act (Not applicable) Fill 1 day early if closed on refill date. Avoid benzodiazepines within 8 hours of opioids   HYDROcodone-acetaminophen (NORCO) 10-325 MG tablet    Sig: Take 1 tablet by mouth every 12 (twelve) hours as needed for severe pain. Must last 30 days.    Dispense:  60 tablet    Refill:  0    Chronic Pain: STOP Act (Not applicable) Fill 1 day early if closed on refill date. Avoid benzodiazepines within 8 hours of opioids   No orders of the defined types were placed in this encounter.    Follow-up plan:   Return if symptoms worsen or fail to improve.   Recent Visits No visits were found meeting these conditions. Showing recent visits within past 90 days and meeting all other requirements Today's Visits Date Type Provider Dept  03/01/23 Office Visit Darcell Jolly, MD [facility]  Showing today's visits and meeting all other requirements Future Appointments No visits were found meeting these conditions. Showing future appointments within next 90 days and meeting all other requirements  I discussed the assessment and treatment plan with the patient. The patient was provided an opportunity to ask questions and all were answered. The patient agreed with the plan and  demonstrated an understanding of the instructions.  Patient advised to call back or seek an in-person evaluation if the symptoms or condition worsens.  Duration of encounter:30 minutes.  Note by: Niklaus Jolly, MD Date: 03/01/2023; Time: 12:40 PM

## 2023-03-05 ENCOUNTER — Other Ambulatory Visit: Payer: Self-pay | Admitting: *Deleted

## 2023-03-05 ENCOUNTER — Telehealth: Payer: Self-pay | Admitting: Student in an Organized Health Care Education/Training Program

## 2023-03-05 DIAGNOSIS — G894 Chronic pain syndrome: Secondary | ICD-10-CM

## 2023-03-05 MED ORDER — HYDROCODONE-ACETAMINOPHEN 10-325 MG PO TABS: 1.0000 | ORAL_TABLET | Freq: Two times a day (BID) | ORAL | 0 refills | Status: AC | PRN

## 2023-03-05 NOTE — Telephone Encounter (Signed)
Medication refill request sent to [facility]  

## 2023-03-05 NOTE — Telephone Encounter (Signed)
PT stated that his current pharmacy is out of the hydrocodone. PT stated that he will like for his prescription to be send to [facility] on [address]. Please give patient a call once prescription has been send in.TY

## 2023-06-04 ENCOUNTER — Emergency Department: Payer: Medicare Other

## 2023-06-04 ENCOUNTER — Observation Stay
Admission: EM | Admit: 2023-06-04 | Payer: Medicare Other | Source: Home / Self Care | Attending: Emergency Medicine | Admitting: Emergency Medicine

## 2023-06-04 ENCOUNTER — Other Ambulatory Visit: Payer: Self-pay

## 2023-06-04 ENCOUNTER — Observation Stay: Payer: Medicare Other

## 2023-06-04 DIAGNOSIS — Z79899 Other long term (current) drug therapy: Secondary | ICD-10-CM | POA: Diagnosis not present

## 2023-06-04 DIAGNOSIS — R42 Dizziness and giddiness: Secondary | ICD-10-CM | POA: Insufficient documentation

## 2023-06-04 DIAGNOSIS — Z87891 Personal history of nicotine dependence: Secondary | ICD-10-CM | POA: Insufficient documentation

## 2023-06-04 DIAGNOSIS — E785 Hyperlipidemia, unspecified: Secondary | ICD-10-CM

## 2023-06-04 DIAGNOSIS — R0789 Other chest pain: Secondary | ICD-10-CM | POA: Diagnosis not present

## 2023-06-04 DIAGNOSIS — I639 Cerebral infarction, unspecified: Secondary | ICD-10-CM | POA: Diagnosis present

## 2023-06-04 DIAGNOSIS — E119 Type 2 diabetes mellitus without complications: Secondary | ICD-10-CM

## 2023-06-04 DIAGNOSIS — R2681 Unsteadiness on feet: Secondary | ICD-10-CM | POA: Diagnosis not present

## 2023-06-04 DIAGNOSIS — I10 Essential (primary) hypertension: Secondary | ICD-10-CM

## 2023-06-04 DIAGNOSIS — R2689 Other abnormalities of gait and mobility: Secondary | ICD-10-CM | POA: Diagnosis not present

## 2023-06-04 DIAGNOSIS — G459 Transient cerebral ischemic attack, unspecified: Secondary | ICD-10-CM | POA: Insufficient documentation

## 2023-06-04 DIAGNOSIS — Z7984 Long term (current) use of oral hypoglycemic drugs: Secondary | ICD-10-CM | POA: Diagnosis not present

## 2023-06-04 DIAGNOSIS — Z950 Presence of cardiac pacemaker: Secondary | ICD-10-CM | POA: Insufficient documentation

## 2023-06-04 DIAGNOSIS — R079 Chest pain, unspecified: Secondary | ICD-10-CM

## 2023-06-04 HISTORY — DX: Essential (primary) hypertension: I10

## 2023-06-04 LAB — CBG MONITORING, ED: Glucose-Capillary: 149 mg/dL — ABNORMAL HIGH (ref 70–99)

## 2023-06-04 LAB — COMPREHENSIVE METABOLIC PANEL
ALT: 25 U/L (ref 0–44)
AST: 28 U/L (ref 15–41)
Albumin: 4.4 g/dL (ref 3.5–5.0)
Alkaline Phosphatase: 58 U/L (ref 38–126)
Anion gap: 8 (ref 5–15)
BUN: 14 mg/dL (ref 8–23)
CO2: 24 mmol/L (ref 22–32)
Calcium: 9.1 mg/dL (ref 8.9–10.3)
Chloride: 103 mmol/L (ref 98–111)
Creatinine, Ser: 1.1 mg/dL (ref 0.61–1.24)
GFR, Estimated: >60 mL/min (ref >60)
Glucose, Bld: 181 mg/dL — ABNORMAL HIGH (ref 70–99)
Potassium: 3.6 mmol/L (ref 3.5–5.1)
Sodium: 135 mmol/L (ref 135–145)
Total Bilirubin: 0.5 mg/dL (ref 0.3–1.2)
Total Protein: 8 g/dL (ref 6.5–8.1)

## 2023-06-04 LAB — DIFFERENTIAL
Abs Immature Granulocytes: 0.03 10*3/uL (ref 0.00–0.07)
Basophils Absolute: 0.1 10*3/uL (ref 0.0–0.1)
Basophils Relative: 1 %
Eosinophils Absolute: 0.1 10*3/uL (ref 0.0–0.5)
Eosinophils Relative: 1 %
Immature Granulocytes: 0 %
Lymphocytes Relative: 28 %
Lymphs Abs: 3.7 10*3/uL (ref 0.7–4.0)
Monocytes Absolute: 1.2 10*3/uL — ABNORMAL HIGH (ref 0.1–1.0)
Monocytes Relative: 10 %
Neutro Abs: 7.7 10*3/uL (ref 1.7–7.7)
Neutrophils Relative %: 60 %

## 2023-06-04 LAB — CBC
HCT: 43.4 % (ref 39.0–52.0)
Hemoglobin: 15 g/dL (ref 13.0–17.0)
MCH: 31.8 pg (ref 26.0–34.0)
MCHC: 34.6 g/dL (ref 30.0–36.0)
MCV: 91.9 fL (ref 80.0–100.0)
Platelets: 289 10*3/uL (ref 150–400)
RBC: 4.72 MIL/uL (ref 4.22–5.81)
RDW: 13.2 % (ref 11.5–15.5)
WBC: 12.8 10*3/uL — ABNORMAL HIGH (ref 4.0–10.5)
nRBC: 0 % (ref 0.0–0.2)

## 2023-06-04 LAB — PROTIME-INR
INR: 1.1 (ref 0.8–1.2)
Prothrombin Time: 14.3 seconds (ref 11.4–15.2)

## 2023-06-04 LAB — APTT: aPTT: 35 seconds (ref 24–36)

## 2023-06-04 LAB — ETHANOL: Alcohol, Ethyl (B): <10 mg/dL (ref <10)

## 2023-06-04 LAB — TROPONIN I (HIGH SENSITIVITY)
Troponin I (High Sensitivity): 10 ng/L (ref <18)
Troponin I (High Sensitivity): 11 ng/L (ref <18)

## 2023-06-04 LAB — HEMOGLOBIN A1C
Hgb A1c MFr Bld: 8 % — ABNORMAL HIGH (ref 4.8–5.6)
Mean Plasma Glucose: 182.9 mg/dL

## 2023-06-04 MED ORDER — ACETAMINOPHEN 325 MG RE SUPP: 650.0000 mg | Freq: Four times a day (QID) | RECTAL | Status: DC | PRN

## 2023-06-04 MED ORDER — ALBUTEROL SULFATE (2.5 MG/3ML) 0.083% IN NEBU: 3.0000 mL | INHALATION_SOLUTION | Freq: Three times a day (TID) | RESPIRATORY_TRACT | Status: DC | PRN

## 2023-06-04 MED ORDER — ONDANSETRON HCL 4 MG/2ML IJ SOLN: 4.0000 mg | Freq: Four times a day (QID) | INTRAMUSCULAR | Status: DC | PRN

## 2023-06-04 MED ORDER — LINAGLIPTIN 5 MG PO TABS
5.0000 mg | ORAL_TABLET | Freq: Every day | ORAL | Status: DC
  Administered 2023-06-05: 5 mg via ORAL
  Filled 2023-06-04 (×2): qty 1

## 2023-06-04 MED ORDER — IOHEXOL 350 MG/ML SOLN
75.0000 mL | Freq: Once | INTRAVENOUS | Status: AC | PRN
  Administered 2023-06-04: 75 mL via INTRAVENOUS

## 2023-06-04 MED ORDER — INSULIN ASPART 100 UNIT/ML IJ SOLN
0.0000 [IU] | Freq: Three times a day (TID) | INTRAMUSCULAR | Status: DC
  Filled 2023-06-04: qty 1

## 2023-06-04 MED ORDER — CLOPIDOGREL BISULFATE 75 MG PO TABS
75.0000 mg | ORAL_TABLET | Freq: Every day | ORAL | Status: DC
Start: 2023-06-04 — End: 2023-06-04

## 2023-06-04 MED ORDER — NITROGLYCERIN 0.4 MG SL SUBL: 0.4000 mg | SUBLINGUAL_TABLET | SUBLINGUAL | Status: DC | PRN

## 2023-06-04 MED ORDER — INSULIN ASPART 100 UNIT/ML IJ SOLN: 0.0000 [IU] | Freq: Every day | INTRAMUSCULAR | Status: DC

## 2023-06-04 MED ORDER — COQ10 200 MG PO CAPS: 200.0000 mg | ORAL_CAPSULE | Freq: Every day | ORAL | Status: DC

## 2023-06-04 MED ORDER — VITAMIN C 500 MG PO TABS
1000.0000 mg | ORAL_TABLET | Freq: Every day | ORAL | Status: DC
  Administered 2023-06-05: 1000 mg via ORAL
  Filled 2023-06-04: qty 2

## 2023-06-04 MED ORDER — ATORVASTATIN CALCIUM 20 MG PO TABS
40.0000 mg | ORAL_TABLET | Freq: Every day | ORAL | Status: DC
  Administered 2023-06-04: 40 mg via ORAL
  Filled 2023-06-04: qty 2

## 2023-06-04 MED ORDER — MAGNESIUM HYDROXIDE 400 MG/5ML PO SUSP: 30.0000 mL | Freq: Every day | ORAL | Status: DC | PRN

## 2023-06-04 MED ORDER — MORPHINE SULFATE (PF) 2 MG/ML IV SOLN
2.0000 mg | INTRAVENOUS | Status: DC | PRN
  Administered 2023-06-04 – 2023-06-05 (×2): 2 mg via INTRAVENOUS
  Filled 2023-06-04 (×2): qty 1

## 2023-06-04 MED ORDER — ONDANSETRON HCL 4 MG PO TABS: 4.0000 mg | ORAL_TABLET | Freq: Four times a day (QID) | ORAL | Status: DC | PRN

## 2023-06-04 MED ORDER — TRAZODONE HCL 50 MG PO TABS: 25.0000 mg | ORAL_TABLET | Freq: Every evening | ORAL | Status: DC | PRN

## 2023-06-04 MED ORDER — ADULT MULTIVITAMIN W/MINERALS CH
1.0000 | ORAL_TABLET | Freq: Every day | ORAL | Status: DC
  Administered 2023-06-05: 1 via ORAL
  Filled 2023-06-04: qty 1

## 2023-06-04 MED ORDER — ACETAMINOPHEN 325 MG PO TABS: 650.0000 mg | ORAL_TABLET | Freq: Four times a day (QID) | ORAL | Status: DC | PRN

## 2023-06-04 MED ORDER — FENTANYL CITRATE PF 50 MCG/ML IJ SOSY
50.0000 ug | PREFILLED_SYRINGE | Freq: Once | INTRAMUSCULAR | Status: AC
  Administered 2023-06-04: 50 ug via INTRAVENOUS
  Filled 2023-06-04: qty 1

## 2023-06-04 MED ORDER — POTASSIUM CHLORIDE IN NACL 20-0.9 MEQ/L-% IV SOLN
INTRAVENOUS | Status: DC
  Filled 2023-06-04 (×3): qty 1000

## 2023-06-04 MED ORDER — STROKE: EARLY STAGES OF RECOVERY BOOK: Freq: Once | Status: DC

## 2023-06-04 MED ORDER — ENOXAPARIN SODIUM 40 MG/0.4ML IJ SOSY
40.0000 mg | PREFILLED_SYRINGE | INTRAMUSCULAR | Status: DC
  Filled 2023-06-04: qty 0.4

## 2023-06-04 MED ORDER — TRAZODONE HCL 50 MG PO TABS: 50.0000 mg | ORAL_TABLET | Freq: Every day | ORAL | Status: DC

## 2023-06-04 NOTE — ED Notes (Signed)
Pt to CT at this time.

## 2023-06-04 NOTE — Assessment & Plan Note (Signed)
-   The patient will be admitted to an observation cardiac telemetry bed. - Will follow serial troponins and EKGs. - The patient will be placed on aspirin as well as p.r.n. sublingual nitroglycerin and morphine sulfate for pain. - We will obtain a cardiology consult in a.m. for further cardiac risk stratification. - I notified Dr. Juliann Pares about the patient

## 2023-06-04 NOTE — H&P (Addendum)
[place]   PATIENT NAME: Andres Torres    MR#:  [id number]  DATE OF BIRTH:  02-Jun-1951  DATE OF ADMISSION:  06/04/2023  PRIMARY CARE PHYSICIAN: Sandrea Hughs, NP   Patient is coming from: Home  REQUESTING/REFERRING PHYSICIAN: Phineas Semen, MD  CHIEF COMPLAINT:   Chief Complaint  Patient presents with   Chest Pain    HISTORY OF PRESENT ILLNESS:  Andres Torres is a 72 y.o. male with medical history significant for type 2 diabetes mellitus, dyslipidemia, hypertension and sick sinus syndrome status post PPM, who presented to emergency room with acute onset of left-sided chest pain that is sharp in nature and without radiation or nausea or vomiting or diaphoresis.  He has chronic pain and is followed by pain management but has missed recent appointments due to lack of transportation.  He experienced dizziness that started about 5 days ago and lasted about a day and a half followed by ataxia since then with difficulty with ambulation.  He initially attributed his dizziness to the hot weather.  He denies any paresthesias or focal muscle weakness.  No headache or blurred vision or diplopia.  No tinnitus or vertigo.  No witnessed seizures.  No urinary or stool incontinence.  No chest pain or palpitations.  No cough or wheezing.  No dysuria, oliguria or hematuria or flank pain.  No bleeding diathesis.  The patient is to take aspirin and is stopped a while ago as he thinks that it did not do anything for him per his report.  He also stopped taking all his medications for a while including BP medications.  He has chronic left hip pain and follows with pain physician and missed an appointment today.  He would like to be rescheduled upon his discharge  ED Course: When he came to the ER, BP was 165/71 with otherwise normal vital signs.  Labs revealed a blood Kos of 181 with otherwise unremarkable CMP.  High sensitive troponin was 10 and later 11 and CBC showed leukocytosis of 12.8.  Alcohol  abuse less than 10.   EKG as reviewed by me : EKG showed Ventricular paced rhythm with a rate of 69 Imaging: Noncontrasted head CT scan revealed small chronic appearing left cerebellar infarct with no acute findings.  2 view chest x-ray showed no acute cardiopulmonary disease.    The patient was given 50 mcg of IV fentanyl.  He will be admitted to an observation cardiac telemetry bed for further evaluation and management. PAST MEDICAL HISTORY:   Past Medical History:  Diagnosis Date   Diabetes mellitus without complication (HCC)    Hypertension    Sick sinus syndrome (HCC)     PAST SURGICAL HISTORY:   Past Surgical History:  Procedure Laterality Date   PACEMAKER LEADLESS INSERTION N/A 08/28/2019   Procedure: PACEMAKER LEADLESS INSERTION;  Surgeon: Marcina Millard, MD;  Location: [facility] INVASIVE CV LAB;  Service: Cardiovascular;  Laterality: N/A;   TEMPORARY PACEMAKER Right 08/28/2019   Procedure: TEMPORARY PACEMAKER;  Surgeon: Marcina Millard, MD;  Location: [facility] INVASIVE CV LAB;  Service: Cardiovascular;  Laterality: Right;  RIJ    SOCIAL HISTORY:   Social History   Tobacco Use   Smoking status: Former    Current packs/day: 0.00    Types: Cigarettes    Quit date: 03/24/2018    Years since quitting: 5.2   Smokeless tobacco: Never  Substance Use Topics   Alcohol use: Not Currently    FAMILY HISTORY:   Family History  Problem Relation Age of Onset   Arthritis Mother    Diabetes Father    Kidney disease Father     DRUG ALLERGIES:  No Known Allergies  REVIEW OF SYSTEMS:   ROS As per history of present illness. All pertinent systems were reviewed above. Constitutional, HEENT, cardiovascular, respiratory, GI, GU, musculoskeletal, neuro, psychiatric, endocrine, integumentary and hematologic systems were reviewed and are otherwise negative/unremarkable except for positive findings mentioned above in the HPI.   MEDICATIONS AT HOME:   Prior to Admission  medications   Medication Sig Start Date End Date Taking? Authorizing Provider  ACCU-CHEK GUIDE test strip USE TO CHECK BLOOD SUGAR ONCE DAILY 04/13/20   [provider]  Ascorbic Acid (VITAMIN C) 1000 MG tablet Take 1,000 mg by mouth daily.    [provider]  atorvastatin (LIPITOR) 40 MG tablet Take 40 mg by mouth daily. 03/19/20   [provider]  Coenzyme Q10 (COQ10) 200 MG CAPS Take 200 mg by mouth daily.    [provider]  JANUVIA 100 MG tablet Take 100 mg by mouth daily. 09/05/22   [provider]  Multiple Vitamin (MULTIVITAMIN WITH MINERALS) TABS tablet Take 1 tablet by mouth daily. Men's One-A-Day    [provider]  traZODone (DESYREL) 50 MG tablet Take by mouth. 03/07/22   [provider]  VENTOLIN HFA 108 (90 Base) MCG/ACT inhaler Inhale 2 puffs into the lungs every 8 (eight) hours as needed for wheezing or shortness of breath.  03/22/18   [provider]      VITAL SIGNS:  Blood pressure (!) 165/71, pulse 73, temperature 98.1 F (36.7 C), temperature source Oral, resp. rate 18, height 5\' 11"  (1.803 m), weight 79.4 kg, SpO2 98%.  PHYSICAL EXAMINATION:  Physical Exam  GENERAL:  72 y.o.-year-old patient lying in the bed with no acute distress.  EYES: Pupils equal, round, reactive to light and accommodation. No scleral icterus. Extraocular muscles intact.  HEENT: Head atraumatic, normocephalic. Oropharynx and nasopharynx clear.  NECK:  Supple, no jugular venous distention. No thyroid enlargement, no tenderness.  LUNGS: Normal breath sounds bilaterally, no wheezing, rales,rhonchi or crepitation. No use of accessory muscles of respiration.  CARDIOVASCULAR: Regular rate and rhythm, S1, S2 normal. No murmurs, rubs, or gallops.  ABDOMEN: Soft, nondistended, nontender. Bowel sounds present. No organomegaly or mass.  EXTREMITIES: No pedal edema, cyanosis, or clubbing.  NEUROLOGIC: Cranial nerves II through XII are  intact. Muscle strength 5/5 in all extremities. Sensation intact. Gait not checked.  PSYCHIATRIC: The patient is alert and oriented x 3.  Normal affect and good eye contact. SKIN: No obvious rash, lesion, or ulcer.   LABORATORY PANEL:   CBC Recent Labs  Lab 06/04/23 0946  WBC 12.8*  HGB 15.0  HCT 43.4  PLT 289   ------------------------------------------------------------------------------------------------------------------  Chemistries  Recent Labs  Lab 06/04/23 0946  NA 135  K 3.6  CL 103  CO2 24  GLUCOSE 181*  BUN 14  CREATININE 1.10  CALCIUM 9.1  AST 28  ALT 25  ALKPHOS 58  BILITOT 0.5   ------------------------------------------------------------------------------------------------------------------  Cardiac Enzymes No results for input(s): "TROPONINI" in the last 168 hours. ------------------------------------------------------------------------------------------------------------------  RADIOLOGY:  CT HEAD WO CONTRAST  Result Date: 06/04/2023 CLINICAL DATA:  Neuro deficit with acute stroke suspected EXAM: CT HEAD WITHOUT CONTRAST TECHNIQUE: Contiguous axial images were obtained from the base of the skull through the vertex without intravenous contrast. RADIATION DOSE REDUCTION: This exam was performed according to the departmental dose-optimization program which  includes automated exposure control, adjustment of the mA and/or kV according to patient size and/or use of iterative reconstruction technique. COMPARISON:  None Available. FINDINGS: Brain: No evidence of acute infarction, hemorrhage, hydrocephalus, extra-axial collection or mass lesion/mass effect. Small left cerebellar infarct which is chronic appearing. Mild cerebral volume loss. Vascular: No hyperdense vessel or unexpected calcification. Skull: Normal. Negative for fracture or focal lesion. Sinuses/Orbits: No acute finding. IMPRESSION: 1. No acute finding. 2. Small chronic appearing left cerebellar  infarct. Electronically Signed   By: Tiburcio Pea M.D.   On: 06/04/2023 11:05   DG Chest 2 View  Result Date: 06/04/2023 CLINICAL DATA:  Chest pain. EXAM: CHEST - 2 VIEW COMPARISON:  Chest radiographs 08/09/2019 FINDINGS: The cardiomediastinal silhouette is within normal limits. A leadless pacemaker is noted. The lungs are hyperinflated with mild coarsening of the interstitial markings. No overt pulmonary edema, airspace consolidation, pleural effusion, or pneumothorax is identified. No acute osseous abnormality is seen. IMPRESSION: No active cardiopulmonary disease. Electronically Signed   By: Sebastian Ache M.D.   On: 06/04/2023 10:49      IMPRESSION AND PLAN:  Assessment and Plan: * CVA (cerebral vascular accident) (HCC) - The patient has dizziness and ataxia and this could be a subacute cerebellar CVA. - The patient will be admitted to an observation cardiac telemetry bed.   - We will follow neuro checks q.4 hours for 24 hours.   - The patient will be placed on aspirin.   - Will obtain a head and neck CTA and 2D echo with bubble study .  Brain MRI could not be obtained due to his pacemaker. - A neurology consultation  as well as physical/occupation/speech therapy consults will be obtained in a.m.Marland Kitchen - I notified Dr. Otelia Limes but the patient. - The patient will be placed on statin therapy and fasting lipids will be checked.   Chest pain - The patient will be admitted to an observation cardiac telemetry bed. - Will follow serial troponins and EKGs. - The patient will be placed on aspirin as well as p.r.n. sublingual nitroglycerin and morphine sulfate for pain. - We will obtain a cardiology consult in a.m. for further cardiac risk stratification. - I notified Dr. Juliann Pares about the patient    Diabetes mellitus type 2, controlled, without complications (HCC) - The patient will be placed on supplemental coverage with NovoLog. - We will continue his Januvia.  Dyslipidemia - We will  continue statin therapy and check fasting lipids.  Essential hypertension - This is apparently diet managed.  BP was initially elevated and improved. - Permissive hypertension will be allowed while we are assessing for CVA.   DVT prophylaxis: Lovenox.  Advanced Care Planning:  Code Status: full code.  Family Communication:  The plan of care was discussed in details with the patient (and family). I answered all questions. The patient agreed to proceed with the above mentioned plan. Further management will depend upon hospital course. Disposition Plan: Back to previous home environment Consults called: Cardiology and neurology All the records are reviewed and case discussed with ED provider.  Status is: Observation   I certify that at the time of admission, it is my clinical judgment that the patient will require hospital care extending less than 2 midnights.                            Dispo: The patient is from: Home  Anticipated d/c is to: Home              Patient currently is not medically stable to d/c.              Difficult to place patient: No  Hannah Beat M.D on 06/04/2023 at 1:47 PM  [facility]   From 7 PM-7 AM, contact night-coverage [email or web address]  CC: Primary care physician; Sandrea Hughs, NP

## 2023-06-04 NOTE — Assessment & Plan Note (Signed)
-   We will continue statin therapy and check fasting lipids. 

## 2023-06-04 NOTE — Assessment & Plan Note (Signed)
-   The patient has dizziness and ataxia and this could be a subacute cerebellar CVA. - The patient will be admitted to an observation cardiac telemetry bed.   - We will follow neuro checks q.4 hours for 24 hours.   - The patient will be placed on aspirin.   - Will obtain a head and neck CTA and 2D echo with bubble study .  Brain MRI could not be obtained due to his pacemaker. - A neurology consultation  as well as physical/occupation/speech therapy consults will be obtained in a.m.Marland Kitchen - I notified Dr. Otelia Limes but the patient. - The patient will be placed on statin therapy and fasting lipids will be checked.

## 2023-06-04 NOTE — Assessment & Plan Note (Addendum)
-   This is apparently diet managed.  BP was initially elevated and improved. - Permissive hypertension will be allowed while we are assessing for CVA.

## 2023-06-04 NOTE — Consult Note (Addendum)
NEURO HOSPITALIST CONSULT NOTE   Requestig physician: Dr. Arville Care  Reason for Consult: Gait unsteadiness with dizziness  History obtained from:  Patient and Chart     HPI:                                                                                                                                          Andres Torres is a 72 y.o. male with a PMHx of DM, HTN and sick sinus syndrome s/p pacemaker placement presenting with a 7 day history of dizziness and gait unsteadiness with listing to the left, symptomatically. He also experienced a fall 5 days ago, which he attributes to his new unsteadiness. He denies vertigo or presyncope, characterizing his dizziness as feeling like he is rocking on a boat. Occasional oscillopsia is also endorsed. He endorses chronic right sided weakness since his pacemaker was placed about 4 years ago. He has chronic left shoulder decreased mobility, as well as left hip pain and left knee pain that are also chronic.   Past Medical History:  Diagnosis Date   Diabetes mellitus without complication (HCC)    Hypertension    Sick sinus syndrome (HCC)     Past Surgical History:  Procedure Laterality Date   PACEMAKER LEADLESS INSERTION N/A 08/28/2019   Procedure: PACEMAKER LEADLESS INSERTION;  Surgeon: Marcina Millard, MD;  Location: [facility] INVASIVE CV LAB;  Service: Cardiovascular;  Laterality: N/A;   TEMPORARY PACEMAKER Right 08/28/2019   Procedure: TEMPORARY PACEMAKER;  Surgeon: Marcina Millard, MD;  Location: [facility] INVASIVE CV LAB;  Service: Cardiovascular;  Laterality: Right;  RIJ    Family History  Problem Relation Age of Onset   Arthritis Mother    Diabetes Father    Kidney disease Father               Social History:  reports that he quit smoking about 5 years ago. His smoking use included cigarettes. He has never used smokeless tobacco. He reports that he does not currently use alcohol. He reports that he does not currently use  drugs.  No Known Allergies  MEDICATIONS:                                                                                                                     No  current facility-administered medications on file prior to encounter.   Current Outpatient Medications on File Prior to Encounter  Medication Sig Dispense Refill   albuterol (VENTOLIN HFA) 108 (90 Base) MCG/ACT inhaler Inhale 2 puffs into the lungs every 4 (four) hours as needed for wheezing or shortness of breath.     Ascorbic Acid (VITAMIN C) 1000 MG tablet Take 1,000 mg by mouth daily.     atorvastatin (LIPITOR) 40 MG tablet Take 40 mg by mouth at bedtime.     Garlic 400 MG TBEC Take 1 tablet by mouth as directed.     HYDROcodone-acetaminophen (NORCO) 10-325 MG tablet Take 1 tablet by mouth 2 (two) times daily.     Multiple Vitamin (MULTIVITAMIN WITH MINERALS) TABS tablet Take 1 tablet by mouth daily.     sitaGLIPtin (JANUVIA) 100 MG tablet Take 100 mg by mouth daily.      Scheduled:  [START ON 06/05/2023]  stroke: early stages of recovery book   Does not apply Once   [START ON 06/05/2023] vitamin C  1,000 mg Oral Daily   atorvastatin  40 mg Oral QHS   enoxaparin (LOVENOX) injection  40 mg Subcutaneous Q24H   insulin aspart  0-5 Units Subcutaneous QHS   [START ON 06/05/2023] insulin aspart  0-9 Units Subcutaneous TID WC   linagliptin  5 mg Oral Daily   [START ON 06/05/2023] multivitamin with minerals  1 tablet Oral Daily   Continuous:  0.9 % NaCl with KCl 20 mEq / L 100 mL/hr at 06/04/23 1425     ROS:                                                                                                                                       Denies neck pain. Other ROS as per HPI.    Blood pressure 131/82, pulse 80, temperature 98 F (36.7 C), temperature source Oral, resp. rate 19, height 5\' 11"  (1.803 m), weight 79.4 kg, SpO2 96%.   General Examination:                                                                                                        Physical Exam HEENT- [place]/AT. No neck pain with movement. No neck stiffness.  Lungs- Respirations unlabored Extremities- Warm and well-perfused  Neurological Examination Mental Status: Awake and alert. Fully oriented. Thought content appropriate.  Speech fluent without evidence of aphasia. Able to follow all commands without difficulty. Cranial Nerves: II: Temporal visual fields intact  with no extinction to DSS.  III,IV, VI: No ptosis. EOMI. No nystagmus in all directions of gaze. V: Temp sensation equal bilaterally VII: Smile symmetric VIII: Hearing intact to voice IX,X: No hypophonia or hoarseness XI: Symmetric XII: Midline tongue extension Motor: RUE: 5/5 LUE: 5/5 except 4/5 triceps and grip, as well as deltoid weakness and decreased shoulder ROM secondary to chronic left shoulder injury and progressive joint dysfunction.  RLE: 5/5 LLE: 5/5 except for left hip flexion against resistance which is deferred due to pain but with antigravity movement intact.  Sensory: Temp and FT intact x 4. No extinction to DSS. Deep Tendon Reflexes: 2+ and symmetric bilateral biceps, brachioradialis and patellae Cerebellar: No ataxia with FNF, RAM and H-S on the right. Ataxic FNF on the left. Ataxic RAM to left arm. Dysmetria with foot tapping on the left.  Gait: Antalgic on the left. Able to stand up with arm assistance but does not require assistance from examiner. Lists to the RIGHT during ambulation. Unsteady gait.    Lab Results: Basic Metabolic Panel: Recent Labs  Lab 06/04/23 0946  NA 135  K 3.6  CL 103  CO2 24  GLUCOSE 181*  BUN 14  CREATININE 1.10  CALCIUM 9.1    CBC: Recent Labs  Lab 06/04/23 0946  WBC 12.8*  NEUTROABS 7.7  HGB 15.0  HCT 43.4  MCV 91.9  PLT 289    Cardiac Enzymes: No results for input(s): "CKTOTAL", "CKMB", "CKMBINDEX", "TROPONINI" in the last 168 hours.  Lipid Panel: No results for input(s): "CHOL", "TRIG", "HDL",  "CHOLHDL", "VLDL", "LDLCALC" in the last 168 hours.  Imaging: CT ANGIO HEAD NECK W WO CM  Result Date: 06/04/2023 CLINICAL DATA:  Chest pain. Dizziness and altered balance for 1 week EXAM: CT ANGIOGRAPHY HEAD AND NECK WITH AND WITHOUT CONTRAST TECHNIQUE: Multidetector CT imaging of the head and neck was performed using the standard protocol during bolus administration of intravenous contrast. Multiplanar CT image reconstructions and MIPs were obtained to evaluate the vascular anatomy. Carotid stenosis measurements (when applicable) are obtained utilizing NASCET criteria, using the distal internal carotid diameter as the denominator. RADIATION DOSE REDUCTION: This exam was performed according to the departmental dose-optimization program which includes automated exposure control, adjustment of the mA and/or kV according to patient size and/or use of iterative reconstruction technique. CONTRAST:  75mL OMNIPAQUE IOHEXOL 350 MG/ML SOLN COMPARISON:  Head CT earlier today FINDINGS: CTA NECK FINDINGS Aortic arch: Atheromatous wall thickening.  Two vessel branching. Right carotid system: Atheromatous wall thickening diffusely with mild accentuation of mixed density plaque at the bifurcation. No stenosis or ulceration. Left carotid system: Diffuse atheromatous wall thickening of the common and internal carotids. No ulceration or significant stenosis Vertebral arteries: No proximal subclavian stenosis despite atherosclerosis. From the origin, the left vertebral artery shows thready flow with less intense enhancement with undulating lumen size. Severe narrowing at the left vertebral origin on coronal reformats. Only mild origin stenosis from atheromatous plaque on the right. Skeleton: Advanced cervical spine degeneration. Other neck: Negative Upper chest: Clear apical lungs Review of the MIP images confirms the above findings CTA HEAD FINDINGS Anterior circulation: Atheromatous irregularity of the carotid siphons which is  advanced. Right cavernous stenosis measures up to 50%. Atheromatous irregularity of ICA branches bilaterally. No branch occlusion or proximal flow reducing stenosis of these vessels. The right A1 segment is relatively hypoplastic. Posterior circulation: Thready and diminished flow in the left vertebral artery continues into the V4 segment with severe narrowing just before the  basilar. Moderate or advanced right vertebral stenosis just below the basilar, see coronal reformats. The basilar shows undulating lumen attributed to atherosclerosis, no significant basilar stenosis. Bilateral PCA atheromatous irregularity with out proximal focal stenosis or branch occlusion. Venous sinuses: Not opacified on this arterial study Anatomic variants: As above Review of the MIP images confirms the above findings IMPRESSION: 1. Thready and delayed flow throughout the left vertebral artery extending to the basilar. This is most likely due to severe narrowings at the origin and terminus, but if there is neck pain or recent injury a dissection would also be considered. 2. Moderate to advanced narrowing of the right vertebral artery just below the basilar. 3. Intracranial branch atherosclerosis without evidence of embolic disease. 4. Cervical carotid atherosclerosis without significant stenosis. Electronically Signed   By: Tiburcio Pea M.D.   On: 06/04/2023 14:59   CT HEAD WO CONTRAST  Result Date: 06/04/2023 CLINICAL DATA:  Neuro deficit with acute stroke suspected EXAM: CT HEAD WITHOUT CONTRAST TECHNIQUE: Contiguous axial images were obtained from the base of the skull through the vertex without intravenous contrast. RADIATION DOSE REDUCTION: This exam was performed according to the departmental dose-optimization program which includes automated exposure control, adjustment of the mA and/or kV according to patient size and/or use of iterative reconstruction technique. COMPARISON:  None Available. FINDINGS: Brain: No evidence of  acute infarction, hemorrhage, hydrocephalus, extra-axial collection or mass lesion/mass effect. Small left cerebellar infarct which is chronic appearing. Mild cerebral volume loss. Vascular: No hyperdense vessel or unexpected calcification. Skull: Normal. Negative for fracture or focal lesion. Sinuses/Orbits: No acute finding. IMPRESSION: 1. No acute finding. 2. Small chronic appearing left cerebellar infarct. Electronically Signed   By: Tiburcio Pea M.D.   On: 06/04/2023 11:05   DG Chest 2 View  Result Date: 06/04/2023 CLINICAL DATA:  Chest pain. EXAM: CHEST - 2 VIEW COMPARISON:  Chest radiographs 08/09/2019 FINDINGS: The cardiomediastinal silhouette is within normal limits. A leadless pacemaker is noted. The lungs are hyperinflated with mild coarsening of the interstitial markings. No overt pulmonary edema, airspace consolidation, pleural effusion, or pneumothorax is identified. No acute osseous abnormality is seen. IMPRESSION: No active cardiopulmonary disease. Electronically Signed   By: Sebastian Ache M.D.   On: 06/04/2023 10:49    Assessment: 72 year old male presenting with a 7 day history of dizziness and gait unsteadiness with listing to the left, symptomatically. He also experienced a fall 5 days ago, which he attributes to his new unsteadiness.  - Exam reveals left hemiataxia and mild left grip and triceps weakness in the context of old left rotator cuff injury. Hip flexion on the left not tested due to joint pain. On testing of ambulation, he lists to the RIGHT and is unsteady, but does not require assistance.  - CT head: No acute finding. Small chronic appearing left cerebellar infarct. - CTA of head and neck: Thready and delayed flow throughout the left vertebral artery extending to the basilar. This is most likely due to severe narrowings at the origin and terminus, but if there is neck pain or recent injury a dissection would also be considered. Moderate to advanced narrowing of the right  vertebral artery just below the basilar. Intracranial branch atherosclerosis without evidence of embolic disease. Cervical carotid atherosclerosis without significant stenosis. - EKG: Ventricular-paced rhythm; when compared with ECG of 29-Aug-2019 05:07, no significant change was found - Labs: - WBC 12.8. CBC otherwise unremarkable.  - CMP essentially unremarkable.  - HgbA1c elevated at 8.0 - EtOH <  10 - Overall impression: Left cerebellar infarct seen on CT head may be subacute, which would explain his presentation. Also possible would be a small brainstem infarct not visible on CT. No thalamic infarct visualized to explain his left hemiataxia.    Recommendations: - Unable to obtain MRI due to pacemaker.  - Continue atorvastatin - Start ASA 81 mg po every day.  - BP management per standard protocol. Out of the permissive HTN time window.  - TTE - Cardiac telemetry - PT consult, OT consult, Speech consult - Risk factor modification to include improved glycemic control - Frequent neuro checks - NPO until passes stroke swallow screen   Electronically signed: Dr. Caryl Pina 06/04/2023, 9:15 PM

## 2023-06-04 NOTE — ED Provider Notes (Signed)
[facility] Provider Note    Event Date/Time   First MD Initiated Contact with Patient 06/04/23 1131     (approximate)   History   Chest Pain   HPI  Andres Torres is a 72 y.o. male who presents to the emergency department today with multiple medical complaints.  The first complaint he sent to myself was for some chest pain.  Located in his left central chest.  He describes it as sharp in nature.  In addition the patient is having some left hip pain.  Does have history of chronic pain and is followed by pain management however has missed recent appointments due to lack of transportation.  Additionally patient says about 5 days ago he became very dizzy.  He says the dizziness lasted for about a day and a half however he has continued to remain unstable in his gait.  He says normally he is able to move without any significant difficulty.     Physical Exam   Triage Vital Signs: ED Triage Vitals  Encounter Vitals Group     BP 06/04/23 0942 (!) 165/71     Systolic BP Percentile --      Diastolic BP Percentile --      Pulse Rate 06/04/23 0942 73     Resp 06/04/23 0942 18     Temp 06/04/23 0942 98.1 F (36.7 C)     Temp Source 06/04/23 0942 Oral     SpO2 06/04/23 0942 98 %     Weight --      Height 06/04/23 0942 5\' 11"  (1.803 m)     Head Circumference --      Peak Flow --      Pain Score 06/04/23 0948 9     Pain Loc --      Pain Education --      Exclude from Growth Chart --     Most recent vital signs: Vitals:   06/04/23 0942  BP: (!) 165/71  Pulse: 73  Resp: 18  Temp: 98.1 F (36.7 C)  SpO2: 98%   General: Awake, alert, oriented. CV:  Good peripheral perfusion. Regular rate and rhythm. Resp:  Normal effort. Shortness of breath. Abd:  No distention.    ED Results / Procedures / Treatments   Labs (all labs ordered are listed, but only abnormal results are displayed) Labs Reviewed  CBC - Abnormal; Notable for the following components:       Result Value   WBC 12.8 (*)    All other components within normal limits  DIFFERENTIAL - Abnormal; Notable for the following components:   Monocytes Absolute 1.2 (*)    All other components within normal limits  COMPREHENSIVE METABOLIC PANEL - Abnormal; Notable for the following components:   Glucose, Bld 181 (*)    All other components within normal limits  PROTIME-INR  APTT  ETHANOL  HEMOGLOBIN A1C  TROPONIN I (HIGH SENSITIVITY)  TROPONIN I (HIGH SENSITIVITY)     EKG  I, Phineas Semen, attending physician, personally viewed and interpreted this EKG  EKG Time: 0948 Rate: 69 Rhythm: ventricular paced rhythm Axis: left axis deviation Intervals: qtc 450 QRS: wide, paced complex ST changes: no st elevation Impression: abnormal ekg   RADIOLOGY I independently interpreted and visualized the CT head. My interpretation: No bleed Radiology interpretation:  IMPRESSION:  1. No acute finding.  2. Small chronic appearing left cerebellar infarct.   I independently interpreted and visualized the CXR. My interpretation: No pneumonia Radiology  interpretation:  IMPRESSION:  No active cardiopulmonary disease.     PROCEDURES:  Critical Care performed: No   MEDICATIONS ORDERED IN ED: Medications - No data to display   IMPRESSION / MDM / ASSESSMENT AND PLAN / ED COURSE  I reviewed the triage vital signs and the nursing notes.                              Differential diagnosis includes, but is not limited to, ACS, pneumonia, CVA, anemia  Patient's presentation is most consistent with acute presentation with potential threat to life or bodily function.   The patient is on the cardiac monitor to evaluate for evidence of arrhythmia and/or significant heart rate changes.  Patient presents to the emergency department today because of concern for chest pain, dizziness and balance issues. In terms of the chest pain troponin was negative here.  EKG without concerning  abnormalities.  In terms of the dizziness CT scan was obtained which did show an chronic appearing lesion in cerebellum.  However this would certainly correlate with the patient's symptoms.  Given recent onset of the symptoms would have concern for more acute infarct.  Unfortunately patient is unable to get MRI secondary to pacemaker.  Discussed with Dr. Arville Care with the hospitalist service who will plan on admission.      FINAL CLINICAL IMPRESSION(S) / ED DIAGNOSES   Final diagnoses:  Imbalance  Cerebrovascular accident (CVA), unspecified mechanism (HCC)     Note:  This document was prepared using Dragon voice recognition software and may include unintentional dictation errors.    Phineas Semen, MD 06/04/23 [phone]

## 2023-06-04 NOTE — Assessment & Plan Note (Signed)
-   The patient will be placed on supplemental coverage with NovoLog. - We will continue his Januvia.

## 2023-06-04 NOTE — ED Triage Notes (Signed)
Pt to ED for chest pain x4 days. Also reports dizziness and altered balance x1 week. Denies hitting head. +generalized weakness

## 2023-06-05 ENCOUNTER — Observation Stay: Payer: Medicare Other

## 2023-06-05 ENCOUNTER — Observation Stay (HOSPITAL_BASED_OUTPATIENT_CLINIC_OR_DEPARTMENT_OTHER)
Admit: 2023-06-05 | Discharge: 2023-06-05 | Disposition: A | Discharge status: Home / Self Care | Payer: Medicare Other | Attending: Family Medicine | Admitting: Family Medicine

## 2023-06-05 DIAGNOSIS — R0789 Other chest pain: Secondary | ICD-10-CM | POA: Diagnosis not present

## 2023-06-05 DIAGNOSIS — G459 Transient cerebral ischemic attack, unspecified: Secondary | ICD-10-CM | POA: Diagnosis not present

## 2023-06-05 DIAGNOSIS — I6389 Other cerebral infarction: Secondary | ICD-10-CM

## 2023-06-05 LAB — CBC
HCT: 37.8 % — ABNORMAL LOW (ref 39.0–52.0)
Hemoglobin: 13.4 g/dL (ref 13.0–17.0)
MCH: 32.2 pg (ref 26.0–34.0)
MCHC: 35.4 g/dL (ref 30.0–36.0)
MCV: 90.9 fL (ref 80.0–100.0)
Platelets: 256 10*3/uL (ref 150–400)
RBC: 4.16 MIL/uL — ABNORMAL LOW (ref 4.22–5.81)
RDW: 13 % (ref 11.5–15.5)
WBC: 10.7 10*3/uL — ABNORMAL HIGH (ref 4.0–10.5)
nRBC: 0 % (ref 0.0–0.2)

## 2023-06-05 LAB — LIPID PANEL
Cholesterol: 133 mg/dL (ref 0–200)
HDL: 36 mg/dL — ABNORMAL LOW (ref >40)
LDL Cholesterol: 86 mg/dL (ref 0–99)
Total CHOL/HDL Ratio: 3.7 RATIO
Triglycerides: 57 mg/dL (ref <150)
VLDL: 11 mg/dL (ref 0–40)

## 2023-06-05 LAB — BASIC METABOLIC PANEL
Anion gap: 7 (ref 5–15)
BUN: 16 mg/dL (ref 8–23)
CO2: 24 mmol/L (ref 22–32)
Calcium: 8.5 mg/dL — ABNORMAL LOW (ref 8.9–10.3)
Chloride: 105 mmol/L (ref 98–111)
Creatinine, Ser: 0.98 mg/dL (ref 0.61–1.24)
GFR, Estimated: >60 mL/min (ref >60)
Glucose, Bld: 158 mg/dL — ABNORMAL HIGH (ref 70–99)
Potassium: 4 mmol/L (ref 3.5–5.1)
Sodium: 136 mmol/L (ref 135–145)

## 2023-06-05 LAB — ECHOCARDIOGRAM COMPLETE BUBBLE STUDY
AR max vel: 2.47 cm2
AV Area VTI: 2.79 cm2
AV Area mean vel: 2.64 cm2
AV Mean grad: 4 mmHg
AV Peak grad: 8.3 mmHg
Ao pk vel: 1.44 m/s
Area-P 1/2: 2.65 cm2
MV VTI: 1.8 cm2
S' Lateral: 2.5 cm

## 2023-06-05 LAB — CBG MONITORING, ED
Glucose-Capillary: 195 mg/dL — ABNORMAL HIGH (ref 70–99)
Glucose-Capillary: 158 mg/dL — ABNORMAL HIGH (ref 70–99)
Glucose-Capillary: 123 mg/dL — ABNORMAL HIGH (ref 70–99)

## 2023-06-05 MED ORDER — MELATONIN 5 MG PO TABS
2.5000 mg | ORAL_TABLET | Freq: Every day | ORAL | Status: DC
  Administered 2023-06-05: 2.5 mg via ORAL
  Filled 2023-06-05: qty 1

## 2023-06-05 MED ORDER — HYDROCODONE-ACETAMINOPHEN 10-325 MG PO TABS
1.0000 | ORAL_TABLET | Freq: Two times a day (BID) | ORAL | Status: DC
  Administered 2023-06-05 (×2): 1 via ORAL
  Filled 2023-06-05 (×2): qty 1

## 2023-06-05 NOTE — Discharge Summary (Signed)
Physician Discharge Summary   Patient: Andres Torres MRN: [id number] DOB: 06-Sep-1951  Admit date:     06/04/2023  Discharge date: 06/05/23  Discharge Physician: Loyce Dys   PCP: Sandrea Hughs, NP    Discharge Diagnoses: TIA  Ruled out CVA (cerebral vascular accident) [facility]) Chest pain likely musculoskeletal Diabetes mellitus type 2, controlled, without complications (HCC) Dyslipidemia Essential hypertension  Hospital Course: Andres Torres is a 72 y.o. male with medical history significant for type 2 diabetes mellitus, dyslipidemia, hypertension and sick sinus syndrome status post PPM, who presented to emergency room with acute onset of left-sided chest pain that is sharp in nature and without radiation or nausea or vomiting or diaphoresis.  He has chronic pain and is followed by pain management but has missed recent appointments due to lack of transportation.  He experienced dizziness that started about 5 days ago and lasted about a day and a half followed by ataxia since then with difficulty with ambulation.  Patient therefore came to the emergency room for further management.  ED Course: When he came to the ER, BP was 165/71 with otherwise normal vital signs.  Labs revealed a blood Kos of 181 with otherwise unremarkable CMP.  High sensitive troponin was 10 and later 11 and CBC showed leukocytosis of 12.8.  Alcohol abuse less than 10.   EKG as reviewed by me : EKG showed Ventricular paced rhythm with a rate of 69 Patient was seen by cardiologist and underwent echocardiogram that was within normal limit, was also seen by neurologist and stroke assessment was done and at this point has been collected by neurologist for discharge.  Also had PT OT eval and his ataxia is much better and has been recommended for home health with PT OT.  Patient therefore being discharged today to follow-up with primary care physician    Consultants: Cardiology, neurology Procedures performed:  None Disposition: Home health Diet recommendation:  Discharge Diet Orders (From admission, onward)     Start     Ordered   06/05/23 0000  Diet - low sodium heart healthy        06/05/23 1656           Cardiac diet DISCHARGE MEDICATION: Allergies as of 06/05/2023   No Known Allergies      Medication List     TAKE these medications    albuterol 108 (90 Base) MCG/ACT inhaler Commonly known as: VENTOLIN HFA Inhale 2 puffs into the lungs every 4 (four) hours as needed for wheezing or shortness of breath.   atorvastatin 40 MG tablet Commonly known as: LIPITOR Take 40 mg by mouth at bedtime.   Garlic 400 MG Tbec Take 1 tablet by mouth as directed.   HYDROcodone-acetaminophen 10-325 MG tablet Commonly known as: NORCO Take 1 tablet by mouth 2 (two) times daily.   multivitamin with minerals Tabs tablet Take 1 tablet by mouth daily.   sitaGLIPtin 100 MG tablet Commonly known as: JANUVIA Take 100 mg by mouth daily.   vitamin C 1000 MG tablet Take 1,000 mg by mouth daily.               Durable Medical Equipment  (From admission, onward)           Start     Ordered   06/05/23 1359  For home use only DME 3 n 1  Once       Comments: 3in1 with drop arm   06/05/23 1359  Follow-up Information     [facility] Go in 1 week(s).   Why: [facility] Card[facility] MD/APP 1-2 weeks Contact information: [address] [place] [address] [phone]                Discharge Exam: Ceasar Mons Weights   06/04/23 1300  Weight: 79.4 kg   GENERAL: Elderly male laying in bed in no acute distress EYES: Pupils equal, round, reactive to light and accommodation. No scleral icterus. Extraocular muscles intact.  HEENT: Normocephalic/atraumatic muscles of respiration.  CARDIOVASCULAR: Heart sounds 1 and 2 present no murmur ABDOMEN: Soft, nondistended, nontender. Bowel sounds present. No organomegaly or mass.  EXTREMITIES:  No pedal edema, cyanosis, or clubbing.  NEUROLOGIC: Alert and oriented x 3 gait assessed by PT OT SKIN: No obvious rash, lesion, or ulcer.  Condition at discharge: good  Discharge time spent:  32 minutes.  Signed: Loyce Dys, MD [facility] 06/05/2023

## 2023-06-05 NOTE — ED Notes (Signed)
Pt CBG was 123. Notified RN, Santina Evans.

## 2023-06-05 NOTE — Evaluation (Signed)
Occupational Therapy Evaluation Patient Details Name: Andres Torres MRN: [id number] DOB: 03-28-1951 Today's Date: 06/05/2023   History of Present Illness Patient is a 72 year old with gait unsteadiness, dizziness, and listing to the left. History of pacemaker and unable to undergo MRI. Small chronic appearing left cerebellar infarct per head CT report. History of chronic pain L shoulder and L hip   Clinical Impression   Patient received for OT evaluation. See flowsheet below for details of function. Pt appears to be close to his recent baseline, but discusses recent challenges with showering, transferring, and IADLs. Has chronic pain, which is managed by pain clinic team. Pt mobilized this morning with PT; demonstrated LE and UE ROM today and engages with OT in discussion of DME needs at home; agreeable to [facility]. Patient will benefit from continued OT while in acute care.      Recommendations for follow up therapy are one component of a multi-disciplinary discharge planning process, led by the attending physician.  Recommendations may be updated based on patient status, additional functional criteria and insurance authorization.   Assistance Recommended at Discharge PRN  Patient can return home with the following Assist for transportation    Functional Status Assessment  Patient has had a recent decline in their functional status and demonstrates the ability to make significant improvements in function in a reasonable and predictable amount of time.  Equipment Recommendations  BSC/3in1;Tub/shower seat    Recommendations for Other Services       Precautions / Restrictions Precautions Precautions: Fall Restrictions Weight Bearing Restrictions: No      Mobility Bed Mobility               General bed mobility comments: pt in recliner at beginning and end of session    Transfers Overall transfer level: Needs assistance Equipment used: None Transfers: Sit to/from Stand Sit  to Stand: Supervision           General transfer comment: Pt stood wtihout assist; declined further mobility at this time; mobilized earlier with PT.      Balance                                           ADL either performed or assessed with clinical judgement   ADL Overall ADL's : Needs assistance/impaired   Eating/Feeding Details (indicate cue type and reason): anticipate (I)   Grooming Details (indicate cue type and reason): anticipate (I)       Lower Body Bathing Details (indicate cue type and reason): discussed recommendation for shower chair; pt in agreement     Lower Body Dressing: Set up;Sit to/from stand Lower Body Dressing Details (indicate cue type and reason): Pt able to doff and re-don BIL shoes in figure four position with moderate increase in pain in L hip.   Toilet Transfer Details (indicate cue type and reason): pt reporting difficulty sit to stand from regular toilet       Tub/Shower Transfer Details (indicate cue type and reason): pt states that transferring has become unsafe at home; he feels afraid of falling in shower (tub).   General ADL Comments: Pt declining functional mobility at this time, citing poor sleep last night, but did engage in lengthy conversation with OT about ADL/IADL challenges recently at home. Would benefit from elevated toilet with rails (BSC over toilet) and shower chair for fall prevention and hygiene (has been  limiting showers due to fear of falling); will benefit from [facility] for addressing these occupational challenges in his home environment.     Vision Patient Visual Report: No change from baseline       Perception     Praxis      Pertinent Vitals/Pain Pain Assessment Pain Assessment: 0-10 Pain Score:  (unrated) Pain Location: chronic pain in L shoulder and L hip Pain Descriptors / Indicators: Discomfort Pain Intervention(s): Limited activity within patient's tolerance, Monitored during session      Hand Dominance Right   Extremity/Trunk Assessment Upper Extremity Assessment Upper Extremity Assessment: LUE deficits/detail (states he can tell in his BIL fingers if his BP is too high.) RUE Deficits / Details: grossly WFL (although he reports intermittent chronic finger tip numbness bilaterally) RUE Sensation: WNL LUE Deficits / Details: shoulder adduction limited while shoulder flexed; has pain chronically LUE Sensation: WNL (although he reports intermittent chronic finger tip numbness bilaterally)   Lower Extremity Assessment Lower Extremity Assessment: Defer to PT evaluation (reports L hip pain chronically) RLE Deficits / Details: grossly WFL. DF 5/5 RLE Sensation: WNL LLE Deficits / Details: L hip pain with AROM and activity. DF 5/5 LLE Sensation: WNL       Communication Communication Communication: No difficulties   Cognition Arousal/Alertness: Awake/alert Behavior During Therapy: WFL for tasks assessed/performed Overall Cognitive Status: Within Functional Limits for tasks assessed                                 General Comments: Able to provide detailed hx of medical and social issues.     General Comments  Pt on room air.    Exercises     Shoulder Instructions      Home Living Family/patient expects to be discharged to:: Private residence Living Arrangements: Alone Available Help at Discharge: Family;Friend(s);Available PRN/intermittently Type of Home: Apartment Home Access: Stairs to enter Entrance Stairs-Number of Steps: 14 Entrance Stairs-Rails: Left (ascending) Home Layout: One level     Bathroom Shower/Tub: Chief Strategy Officer: Standard     Home Equipment: None   Additional Comments: Daughter lives nearby; helps intermittently. Pt states that he did have his son with him, but he is no longer living in the home with patient.      Prior Functioning/Environment Prior Level of Function : Independent/Modified  Independent;Driving             Mobility Comments: Pt is limited community ambulator, no AD. One fall (pt states from dizziness/heat recently). Pt states mobility is difficult lately; reports decline in overall well-being since pandemic began and he got pacemaker (early in pandemic). Chronic pain limits mobility. ADLs Comments: Pt states he is (I) for ADLs, but has had more difficulty in recent weeks. States he went from showering 3-4x/week to about 1x/week due to fear of falling; states difficulty getting out of the shower moreso than in; does not have any DME to help. Increasing difficulty with toilet transfers (no grab bars). Is able to slowly dress with figure four position in sitting. States difficulty recently with laundry 2/2 it being far away and down a flight of stairs. Has not driven in about 2 weeks; typically grocery shops and prepares own meals. 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        OT  Problem List: Decreased activity tolerance;Impaired balance (sitting and/or standing);Decreased knowledge of use of DME or AE      OT Treatment/Interventions: Self-care/ADL training;Therapeutic exercise;Patient/family education;DME and/or AE instruction;Therapeutic activities    OT Goals(Current goals can be found in the care plan section) Acute Rehab OT Goals Patient Stated Goal: Get better; decrease fall risk OT Goal Formulation: With patient Time For Goal Achievement: 06/19/23 Potential to Achieve Goals: Good ADL Goals Pt Will Transfer to Toilet: bedside commode;ambulating;Independently Pt Will Perform Toileting - Clothing Manipulation and hygiene: Independently;sit to/from stand Pt Will Perform Tub/Shower Transfer: Tub transfer;with modified independence;shower seat  OT Frequency: Min 1X/week    Co-evaluation              AM-PAC OT "6 Clicks" Daily  Activity     Outcome Measure Help from another person eating meals?: None Help from another person taking care of personal grooming?: None Help from another person toileting, which includes using toliet, bedpan, or urinal?: A Little Help from another person bathing (including washing, rinsing, drying)?: A Little Help from another person to put on and taking off regular upper body clothing?: None Help from another person to put on and taking off regular lower body clothing?: None 6 Click Score: 22   End of Session Nurse Communication: Other (comment);Patient requests pain meds (pt need for CM services)  Activity Tolerance: Patient limited by fatigue Patient left: in chair;with call bell/phone within reach  OT Visit Diagnosis: Unsteadiness on feet (R26.81)                Time: 3474-2595 OT Time Calculation (min): 26 min Charges:  OT General Charges $OT Visit: 1 Visit OT Evaluation $OT Eval Moderate Complexity: 1 Mod OT Treatments $Self Care/Home Management : 8-22 mins  Linward Foster, MS, OTR/L  Alvester Morin 06/05/2023, 12:39 PM

## 2023-06-05 NOTE — TOC Initial Note (Signed)
Transition of Care [facility]) - Initial/Assessment Note    Patient Details  Name: Andres Torres MRN: [id number] Date of Birth: Aug 24, 1951  Transition of Care [facility]) CM/SW Contact:    Kreg Shropshire, RN Phone Number: 06/05/2023, 2:05 PM  Clinical Narrative:                 Cm received consult for [facility] for pt. Pt lives alone and has support from his children who live nearby. He has no preference on [facility] company. Cm sent secure message to [facility]. [place] confirmed to take pt. OT also recomened BSC 3 in 1. Cm sent secure message to [facility] to have [facility] delivered to pt house per pt request. He has transportation home whenever d/c. Cm will continue to follow.  Expected Discharge Plan: Home w Home Health Services Barriers to Discharge: Continued Medical Work up   Patient Goals and CMS Choice   CMS Medicare.gov Compare Post Acute Care list provided to:: Patient Choice offered to / list presented to : Patient      Expected Discharge Plan and Services       Living arrangements for the past 2 months: Single Family Home                 DME Arranged: 3-N-1 DME Agency: [employer] Date DME Agency Contacted: 06/05/23 Time DME Agency Contacted: [phone] Representative spoke with at DME Agency: [facility] HH Arranged: PT, OT, Nurse's Aide HH Agency: [facility]     Representative spoke with at [facility] Agency: [facility]  Prior Living Arrangements/Services Living arrangements for the past 2 months: Single Family Home Lives with:: Self Patient language and need for interpreter reviewed:: Yes Do you feel safe going back to the place where you live?: Yes               Activities of Daily Living      Permission Sought/Granted                  Emotional Assessment Appearance:: Appears stated age Attitude/Demeanor/Rapport: Engaged Affect (typically observed): Calm Orientation: : Oriented to Self, Oriented to Place, Oriented to  Time, Fluctuating Orientation  (Suspected and/or reported Sundowners) Alcohol / Substance Use: Never Used Psych Involvement: No (comment)  Admission diagnosis:  CVA (cerebral vascular accident) [facility]) [I63.9] Patient Active Problem List   Diagnosis Date Noted   CVA (cerebral vascular accident) (HCC) 06/04/2023   Chest pain 06/04/2023   Diabetes mellitus type 2, controlled, without complications (HCC) 06/04/2023   Dyslipidemia 06/04/2023   Essential hypertension 06/04/2023   Chronic pain of left knee 03/08/2021   Arthritis of left knee 03/08/2021   Cervical radiculopathy 03/08/2021   CHB (complete heart block) (HCC) 08/28/2019   Complete heart block (HCC) 08/09/2019   Lumbar spondylosis 07/03/2018   Lumbar facet joint pain 07/03/2018   Cervical facet joint syndrome 07/03/2018   Chronic pain syndrome 04/23/2018   Lumbar degenerative disc disease 04/23/2018   Chronic right shoulder pain 04/23/2018   Musculoskeletal pain 04/23/2018   Pharmacologic therapy 04/23/2018   Neck pain 04/23/2018   Localized osteoarthrosis of left hip 10/22/2013   Foot arch pain 10/22/2013   PCP:  Sandrea Hughs, NP Pharmacy:   [facility] - [place], [place] - [address] [place] [address] Phone: [phone] Fax: [phone]     Social Determinants of Health (SDOH) Social History: SDOH Screenings   Depression (PHQ2-9): Low Risk  (  03/01/2023)  Tobacco Use: Medium Risk (06/04/2023)   SDOH Interventions:     Readmission Risk Interventions     No data to display

## 2023-06-05 NOTE — TOC CM/SW Note (Signed)
Patient is not able to walk the distance required to go the bathroom, or he/she is unable to safely negotiate stairs required to access the bathroom.  A 3in1 BSC will alleviate this problem  

## 2023-06-05 NOTE — Progress Notes (Signed)
SLP Cancellation Note  Patient Details Name: Andres Torres MRN: [id number] DOB: December 19, 1950   Cancelled treatment:       Reason Eval/Treat Not Completed: SLP screened, no needs identified, will sign off (chart reviewed; consulted NSG then met w/ pt in room.)  Pt denied any difficulty swallowing and is currently on a regular diet; tolerates swallowing pills w/ water per NSG. Pt drinking water in room. He explained that he eats a more specific diet, more plant-based. He also explained that d/t missing Dentition (several "pulled b/f the pandemic" and "I still need more done to get dentures") he makes Smoothies at home to drink. He agreed to talk w/ the Dietician re: his dietary needs while admitted d/t not eating much from the Menu.  Pt conversed in conversation w/out expressive/receptive communication deficits noted; pt denied any speech-language deficits - "what do you think? I am good". Speech clear. No further skilled ST services indicated as pt appears at his  communication baseline. Pt agreed. NSG to reconsult if any change in status while admitted.     Jerilynn Som, MS, CCC-SLP Speech Language Pathologist Rehab Services; [facility] [phone] (ascom) Nicholi Ghuman 06/05/2023, 12:37 PM

## 2023-06-05 NOTE — Progress Notes (Signed)
*  PRELIMINARY RESULTS* Echocardiogram 2D Echocardiogram has been performed.  Andres Torres 06/05/2023, 7:57 AM

## 2023-06-05 NOTE — Progress Notes (Signed)
Patient ID: Andres Torres, male   DOB: August 26, 1951, 72 y.o.   MRN: [id number] [facility]    SUBJECTIVE: Patient states to be admitted to be well no further chest pain and send palpitations occasional tachycardia patient had a previous CVA but in general it seems to be doing reasonably well now   Vitals:   06/05/23 0200 06/05/23 0832 06/05/23 0854 06/05/23 1523  BP: (!) 142/80  (!) 190/89 125/78  Pulse: (!) 120  71 78  Resp: 17  18 16   Temp:  98.2 F (36.8 C)  97.8 F (36.6 C)  TempSrc:  Oral  Oral  SpO2: 99%  100% 95%  Weight:      Height:        No intake or output data in the 24 hours ending 06/05/23 1704    PHYSICAL EXAM  General: Well developed, well nourished, in no acute distress HEENT:  Normocephalic and atramatic Neck:  No JVD.  Lungs: Clear bilaterally to auscultation and percussion. Heart: HRRR . Normal S1 and S2 without gallops or murmurs.  Abdomen: Bowel sounds are positive, abdomen soft and non-tender  Msk:  Back normal, normal gait. Normal strength and tone for age. Extremities: No clubbing, cyanosis or edema.   Neuro: Alert and oriented X 3. Psych:  Good affect, responds appropriately   LABS: Basic Metabolic Panel: Recent Labs    06/04/23 0946 06/05/23 0626  NA 135 136  K 3.6 4.0  CL 103 105  CO2 24 24  GLUCOSE 181* 158*  BUN 14 16  CREATININE 1.10 0.98  CALCIUM 9.1 8.5*   Liver Function Tests: Recent Labs    06/04/23 0946  AST 28  ALT 25  ALKPHOS 58  BILITOT 0.5  PROT 8.0  ALBUMIN 4.4   No results for input(s): "LIPASE", "AMYLASE" in the last 72 hours. CBC: Recent Labs    06/04/23 0946 06/05/23 0626  WBC 12.8* 10.7*  NEUTROABS 7.7  --   HGB 15.0 13.4  HCT 43.4 37.8*  MCV 91.9 90.9  PLT 289 256   Cardiac Enzymes: No results for input(s): "CKTOTAL", "CKMB", "CKMBINDEX", "TROPONINI" in the last 72 hours. BNP: Invalid input(s): "POCBNP" D-Dimer: No results for input(s): "DDIMER" in the last 72 hours. Hemoglobin  A1C: Recent Labs    06/04/23 0946  HGBA1C 8.0*   Fasting Lipid Panel: Recent Labs    06/05/23 0626  CHOL 133  HDL 36*  LDLCALC 86  TRIG 57  CHOLHDL 3.7   Thyroid Function Tests: No results for input(s): "TSH", "T4TOTAL", "T3FREE", "THYROIDAB" in the last 72 hours.  Invalid input(s): "FREET3" Anemia Panel: No results for input(s): "VITAMINB12", "FOLATE", "FERRITIN", "TIBC", "IRON", "RETICCTPCT" in the last 72 hours.  CT HEAD WO CONTRAST  Result Date: 06/05/2023 CLINICAL DATA:  Neuro deficit, acute, stroke suspected.  Follow-up. EXAM: CT HEAD WITHOUT CONTRAST TECHNIQUE: Contiguous axial images were obtained from the base of the skull through the vertex without intravenous contrast. RADIATION DOSE REDUCTION: This exam was performed according to the departmental dose-optimization program which includes automated exposure control, adjustment of the mA and/or kV according to patient size and/or use of iterative reconstruction technique. COMPARISON:  Head CT 06/04/2023. FINDINGS: Brain: No acute hemorrhage. 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 Mild generalized volume loss. No acute hydrocephalus or extra-axial collection. No mass effect or midline shift. Vascular: Circulating contrast from recent CTA. Skull: No calvarial fracture or suspicious bone lesion. Skull base is unremarkable. Sinuses/Orbits: Unremarkable. Other: None. IMPRESSION: 1. No acute  intracranial hemorrhage. 2. 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 Electronically Signed   By: Orvan Falconer M.D.   On: 06/05/2023 14:14   ECHOCARDIOGRAM COMPLETE BUBBLE STUDY  Result Date: 06/05/2023    ECHOCARDIOGRAM REPORT   Patient Name:   [name] Date of Exam: 06/05/2023 Medical Rec #:  [id number]     Height:       71.0 in Accession #:    [id number]    Weight:       175.0 lb Date of Birth:  04-10-1951      BSA:          1.992 m Patient Age:    72 years       BP:           142/80 mmHg Patient Gender: M             HR:           120 bpm. Exam Location:  [facility] Procedure: 2D Echo, Cardiac Doppler, Color Doppler and Saline Contrast Bubble            Study Indications:     Stroke 434.91 /I63.9  History:         Patient has prior history of Echocardiogram examinations, most                  recent 08/10/2019. Risk Factors:Diabetes and Hypertension.  Sonographer:     Cristela Blue Referring Phys:  [id number] JAN A MANSY Diagnosing Phys: Lorine Bears MD  Sonographer Comments: Image acquisition challenging due to respiratory motion. IMPRESSIONS  1. Left ventricular ejection fraction, by estimation, is 55 to 60%. The left ventricle has normal function. The left ventricle has no regional wall motion abnormalities. There is mild asymmetric left ventricular hypertrophy of the septal segment. Left ventricular diastolic parameters are consistent with Grade I diastolic dysfunction (impaired relaxation).  2. Right ventricular systolic function is normal. The right ventricular size is normal. Tricuspid regurgitation signal is inadequate for assessing PA pressure.  3. The mitral valve is normal in structure. Trivial mitral valve regurgitation. No evidence of mitral stenosis.  4. The aortic valve is normal in structure. Aortic valve regurgitation is not visualized. Aortic valve sclerosis/calcification is present, without any evidence of aortic stenosis.  5. The inferior vena cava is normal in size with greater than 50% respiratory variability, suggesting right atrial pressure of 3 mmHg.  6. Agitated saline contrast bubble study was negative, with no evidence of any interatrial shunt. FINDINGS  Left Ventricle: Left ventricular ejection fraction, by estimation, is 55 to 60%. The left ventricle has normal function. The left ventricle has no regional wall motion abnormalities. The left ventricular internal cavity size was normal in size. There is  mild asymmetric left ventricular hypertrophy of the  septal segment. Left ventricular diastolic parameters are consistent with Grade I diastolic dysfunction (impaired relaxation). Right Ventricle: The right ventricular size is normal. No increase in right ventricular wall thickness. Right ventricular systolic function is normal. Tricuspid regurgitation signal is inadequate for assessing PA pressure. The tricuspid regurgitant velocity is 1.67 m/s, and with an assumed right atrial pressure of 3 mmHg, the estimated right ventricular systolic pressure is 14.2 mmHg. Left Atrium: Left atrial size was normal in size. Right Atrium: Right atrial size was normal in size. Pericardium: There is no evidence of pericardial effusion. Mitral Valve: The mitral valve is normal in structure. Trivial mitral valve regurgitation. No evidence of mitral valve stenosis. MV peak gradient, 14.7  mmHg. The mean mitral valve gradient is 6.0 mmHg. Tricuspid Valve: The tricuspid valve is normal in structure. Tricuspid valve regurgitation is not demonstrated. No evidence of tricuspid stenosis. Aortic Valve: The aortic valve is normal in structure. Aortic valve regurgitation is not visualized. Aortic valve sclerosis/calcification is present, without any evidence of aortic stenosis. Aortic valve mean gradient measures 4.0 mmHg. Aortic valve peak  gradient measures 8.3 mmHg. Aortic valve area, by VTI measures 2.79 cm. Pulmonic Valve: The pulmonic valve was normal in structure. Pulmonic valve regurgitation is not visualized. No evidence of pulmonic stenosis. Aorta: The aortic root is normal in size and structure. Venous: The inferior vena cava is normal in size with greater than 50% respiratory variability, suggesting right atrial pressure of 3 mmHg. IAS/Shunts: No atrial level shunt detected by color flow Doppler. Agitated saline contrast was given intravenously to evaluate for intracardiac shunting. Agitated saline contrast bubble study was negative, with no evidence of any interatrial shunt.  LEFT  VENTRICLE PLAX 2D LVIDd:         4.20 cm   Diastology LVIDs:         2.50 cm   LV e' medial:    5.55 cm/s LV PW:         0.90 cm   LV E/e' medial:  13.8 LV IVS:        1.50 cm   LV e' lateral:   6.96 cm/s LVOT diam:     2.00 cm   LV E/e' lateral: 11.0 LV SV:         82 LV SV Index:   41 LVOT Area:     3.14 cm  RIGHT VENTRICLE RV Basal diam:  3.80 cm RV Mid diam:    3.00 cm RV S prime:     9.79 cm/s TAPSE (M-mode): 2.3 cm LEFT ATRIUM           Index        RIGHT ATRIUM           Index LA diam:      2.90 cm 1.46 cm/m   RA Area:     13.50 cm LA Vol (A2C): 37.9 ml 19.02 ml/m  RA Volume:   34.20 ml  17.17 ml/m LA Vol (A4C): 16.9 ml 8.48 ml/m  AORTIC VALVE AV Area (Vmax):    2.47 cm AV Area (Vmean):   2.64 cm AV Area (VTI):     2.79 cm AV Vmax:           144.00 cm/s AV Vmean:          88.100 cm/s AV VTI:            0.293 m AV Peak Grad:      8.3 mmHg AV Mean Grad:      4.0 mmHg LVOT Vmax:         113.00 cm/s LVOT Vmean:        74.000 cm/s LVOT VTI:          0.260 m LVOT/AV VTI ratio: 0.89  AORTA Ao Root diam: 3.40 cm MITRAL VALVE                TRICUSPID VALVE MV Area (PHT): 2.65 cm     TR Peak grad:   11.2 mmHg MV Area VTI:   1.80 cm     TR Vmax:        167.00 cm/s MV Peak grad:  14.7 mmHg MV Mean grad:  6.0 mmHg  SHUNTS MV Vmax:       1.92 m/s     Systemic VTI:  0.26 m MV Vmean:      117.0 cm/s   Systemic Diam: 2.00 cm MV Decel Time: 286 msec MV E velocity: 76.50 cm/s MV A velocity: 154.00 cm/s MV E/A ratio:  0.50 Lorine Bears MD Electronically signed by Lorine Bears MD Signature Date/Time: 06/05/2023/12:56:37 PM    Final    CT ANGIO HEAD NECK W WO CM  Result Date: 06/04/2023 CLINICAL DATA:  Chest pain. Dizziness and altered balance for 1 week EXAM: CT ANGIOGRAPHY HEAD AND NECK WITH AND WITHOUT CONTRAST TECHNIQUE: Multidetector CT imaging of the head and neck was performed using the standard protocol during bolus administration of intravenous contrast. Multiplanar CT image reconstructions and MIPs  were obtained to evaluate the vascular anatomy. Carotid stenosis measurements (when applicable) are obtained utilizing NASCET criteria, using the distal internal carotid diameter as the denominator. RADIATION DOSE REDUCTION: This exam was performed according to the departmental dose-optimization program which includes automated exposure control, adjustment of the mA and/or kV according to patient size and/or use of iterative reconstruction technique. CONTRAST:  75mL OMNIPAQUE IOHEXOL 350 MG/ML SOLN COMPARISON:  Head CT earlier today FINDINGS: CTA NECK FINDINGS Aortic arch: Atheromatous wall thickening.  Two vessel branching. Right carotid system: Atheromatous wall thickening diffusely with mild accentuation of mixed density plaque at the bifurcation. No stenosis or ulceration. Left carotid system: Diffuse atheromatous wall thickening of the common and internal carotids. No ulceration or significant stenosis Vertebral arteries: No proximal subclavian stenosis despite atherosclerosis. From the origin, the left vertebral artery shows thready flow with less intense enhancement with undulating lumen size. Severe narrowing at the left vertebral origin on coronal reformats. Only mild origin stenosis from atheromatous plaque on the right. Skeleton: Advanced cervical spine degeneration. Other neck: Negative Upper chest: Clear apical lungs Review of the MIP images confirms the above findings CTA HEAD FINDINGS Anterior circulation: Atheromatous irregularity of the carotid siphons which is advanced. Right cavernous stenosis measures up to 50%. Atheromatous irregularity of ICA branches bilaterally. No branch occlusion or proximal flow reducing stenosis of these vessels. The right A1 segment is relatively hypoplastic. Posterior circulation: Thready and diminished flow in the left vertebral artery continues into the V4 segment with severe narrowing just before the basilar. Moderate or advanced right vertebral stenosis just below  the basilar, see coronal reformats. The basilar shows undulating lumen attributed to atherosclerosis, no significant basilar stenosis. Bilateral PCA atheromatous irregularity with out proximal focal stenosis or branch occlusion. Venous sinuses: Not opacified on this arterial study Anatomic variants: As above Review of the MIP images confirms the above findings IMPRESSION: 1. Thready and delayed flow throughout the left vertebral artery extending to the basilar. This is most likely due to severe narrowings at the origin and terminus, but if there is neck pain or recent injury a dissection would also be considered. 2. Moderate to advanced narrowing of the right vertebral artery just below the basilar. 3. Intracranial branch atherosclerosis without evidence of embolic disease. 4. Cervical carotid atherosclerosis without significant stenosis. Electronically Signed   By: Tiburcio Pea M.D.   On: 06/04/2023 14:59   CT HEAD WO CONTRAST  Result Date: 06/04/2023 CLINICAL DATA:  Neuro deficit with acute stroke suspected EXAM: CT HEAD WITHOUT CONTRAST TECHNIQUE: Contiguous axial images were obtained from the base of the skull through the vertex without intravenous contrast. RADIATION DOSE REDUCTION: This exam was performed according to the departmental dose-optimization program which  includes automated exposure control, adjustment of the mA and/or kV according to patient size and/or use of iterative reconstruction technique. COMPARISON:  None Available. FINDINGS: Brain: No evidence of acute infarction, hemorrhage, hydrocephalus, extra-axial collection or mass lesion/mass effect. Small left cerebellar infarct which is chronic appearing. Mild cerebral volume loss. Vascular: No hyperdense vessel or unexpected calcification. Skull: Normal. Negative for fracture or focal lesion. Sinuses/Orbits: No acute finding. IMPRESSION: 1. No acute finding. 2. Small chronic appearing left cerebellar infarct. Electronically Signed   By:  Tiburcio Pea M.D.   On: 06/04/2023 11:05   DG Chest 2 View  Result Date: 06/04/2023 CLINICAL DATA:  Chest pain. EXAM: CHEST - 2 VIEW COMPARISON:  Chest radiographs 08/09/2019 FINDINGS: The cardiomediastinal silhouette is within normal limits. A leadless pacemaker is noted. The lungs are hyperinflated with mild coarsening of the interstitial markings. No overt pulmonary edema, airspace consolidation, pleural effusion, or pneumothorax is identified. No acute osseous abnormality is seen. IMPRESSION: No active cardiopulmonary disease. Electronically Signed   By: Sebastian Ache M.D.   On: 06/04/2023 10:49     Echo preserved left ventricular function EF of  55%  TELEMETRY: Ventricular paced rhythm left bundle branch block rate of 70:  ASSESSMENT AND PLAN:  Principal Problem:   CVA (cerebral vascular accident) (HCC) Active Problems:   Chest pain   Diabetes mellitus type 2, controlled, without complications (HCC)   Dyslipidemia   Essential hypertension    Plan Vertigo intermittent recurrent appears to be noncardiac consider evaluation by neurology History of CVA in the past no current deficits recommend follow-up with neurology Diabetes type 2 uncomplicated continue Januvia continue diet and exercise Hypertension reasonably controlled continue conservative management diet and exercise Hyperlipidemia continue Lipitor therapy for lipid management Shortness of breath possible COPD continue inhalers Patient should be safe from a cardiac standpoint for discharge Have patient follow-up with cardiology 1 to 2 weeks   Alwyn Pea, MD, 06/05/2023 5:04 PM

## 2023-06-05 NOTE — Evaluation (Signed)
Physical Therapy Evaluation Patient Details Name: Andres Torres MRN: [id number] DOB: 1951/07/05 Today's Date: 06/05/2023  History of Present Illness  Patient is a 72 year old with gait unsteadiness, dizziness, and listing to the left. History of pacemaker and unable to undergo MRI. Small chronic appearing left cerebellar infarct per head CT report. History of chronic pain L shoulder and L hip   Clinical Impression  Patient is agreeable to PT evaluation. He reports limited community ambulation at baseline secondary to chronic L hip pain. He is independent with mobility at lives alone in an apartment and reports his daughter lives in the same complex.  The patient ambulated in hallway today with mild veering to the right with no overt loss of balance. Gait distance is limited by chronic L hip pain. No dizziness is reported with mobility and supervision provided for safety. Recommend to continue PT to maximize independence and decrease caregiver burden. The patient is interested in home health services upon hospital discharge.       Assistance Recommended at Discharge Set up Supervision/Assistance  If plan is discharge home, recommend the following:  Can travel by private vehicle  Help with stairs or ramp for entrance;Assist for transportation;Assistance with cooking/housework        Equipment Recommendations BSC/3in1  Recommendations for Other Services       Functional Status Assessment Patient has had a recent decline in their functional status and demonstrates the ability to make significant improvements in function in a reasonable and predictable amount of time.     Precautions / Restrictions Precautions Precautions: Fall Restrictions Weight Bearing Restrictions: No      Mobility  Bed Mobility Overal bed mobility: Needs Assistance Bed Mobility: Supine to Sit     Supine to sit: Supervision     General bed mobility comments: increased time related to chronic L hip pain     Transfers Overall transfer level: Needs assistance Equipment used: None Transfers: Sit to/from Stand Sit to Stand: Supervision                Ambulation/Gait Ambulation/Gait assistance: Supervision Gait Distance (Feet): 100 Feet Assistive device: None, IV Pole Gait Pattern/deviations: Step-through pattern, Decreased stance time - left, Antalgic, Drifts right/left Gait velocity: decreased     General Gait Details: mild drifting to the R noted without gross loss of balance. gait is antalgic with decreased stance time on L with chronic hip pain limiting further progression of ambulation. limited community distance ambulation at baseline. intermittent use of IV for comfort  Stairs            Wheelchair Mobility     Tilt Bed    Modified Rankin (Stroke Patients Only)       Balance Overall balance assessment: Needs assistance Sitting-balance support: Feet supported Sitting balance-Leahy Scale: Good     Standing balance support: No upper extremity supported Standing balance-Leahy Scale: Fair                               Pertinent Vitals/Pain Pain Assessment Pain Assessment: Faces Faces Pain Scale: Hurts little more Pain Location: L shoulder and L hip Pain Descriptors / Indicators: Discomfort Pain Intervention(s): Limited activity within patient's tolerance, Monitored during session, Repositioned    Home Living Family/patient expects to be discharged to:: Private residence Living Arrangements: Alone Available Help at Discharge: Family;Friend(s);Available PRN/intermittently Type of Home: Apartment Home Access: Stairs to enter Entrance Stairs-Rails: Left (L rail going up, wall  on the R) Entrance Stairs-Number of Steps: 14   Home Layout: One level Home Equipment: None      Prior Function Prior Level of Function : Independent/Modified Independent             Mobility Comments: community ambulation limited by L hip pain. only 1 fall  reported recently (he reports from dizziness and the heat outside) ADLs Comments: independent, gets down into the tub for bathing     Hand Dominance   Dominant Hand: Right    Extremity/Trunk Assessment   Upper Extremity Assessment Upper Extremity Assessment: LUE deficits/detail;RUE deficits/detail RUE Deficits / Details: grossly WFL (although he reports intermittent chronic finger tip numbness bilaterally) RUE Sensation: WNL LUE Deficits / Details: shoulder pain with resistance. grip strength slightly weaker than the right LUE Sensation: WNL (although he reports intermittent chronic finger tip numbness bilaterally)    Lower Extremity Assessment Lower Extremity Assessment: RLE deficits/detail;LLE deficits/detail RLE Deficits / Details: grossly WFL. DF 5/5 RLE Sensation: WNL LLE Deficits / Details: L hip pain with AROM and activity. DF 5/5 LLE Sensation: WNL       Communication   Communication: No difficulties  Cognition Arousal/Alertness: Awake/alert Behavior During Therapy: WFL for tasks assessed/performed Overall Cognitive Status: Within Functional Limits for tasks assessed                                          General Comments General comments (skin integrity, edema, etc.): Sp02 100% on room air after ambulation. no dizziness reported with activity    Exercises     Assessment/Plan    PT Assessment Patient needs continued PT services  PT Problem List Decreased activity tolerance;Decreased balance;Decreased mobility;Pain       PT Treatment Interventions DME instruction;Gait training;Stair training;Functional mobility training;Therapeutic activities;Therapeutic exercise;Balance training;Cognitive remediation;Neuromuscular re-education;Patient/family education    PT Goals (Current goals can be found in the Care Plan section)  Acute Rehab PT Goals Patient Stated Goal: to have an aide at home for a few days PT Goal Formulation: With patient Time  For Goal Achievement: 06/19/23 Potential to Achieve Goals: Good    Frequency Min 1X/week     Co-evaluation               AM-PAC PT "6 Clicks" Mobility  Outcome Measure Help needed turning from your back to your side while in a flat bed without using bedrails?: None Help needed moving from lying on your back to sitting on the side of a flat bed without using bedrails?: A Little Help needed moving to and from a bed to a chair (including a wheelchair)?: A Little Help needed standing up from a chair using your arms (e.g., wheelchair or bedside chair)?: A Little Help needed to walk in hospital room?: A Little Help needed climbing 3-5 steps with a railing? : A Little 6 Click Score: 19    End of Session   Activity Tolerance: Patient tolerated treatment well Patient left: in bed;with call bell/phone within reach;with bed alarm set Nurse Communication: Mobility status PT Visit Diagnosis: Other abnormalities of gait and mobility (R26.89);Difficulty in walking, not elsewhere classified (R26.2)    Time: 4540-9811 PT Time Calculation (min) (ACUTE ONLY): 34 min   Charges:   PT Evaluation $PT Eval Low Complexity: 1 Low PT Treatments $Therapeutic Activity: 8-22 mins PT General Charges $$ ACUTE PT VISIT: 1 Visit  Donna Bernard, PT, MPT   Ina Homes 06/05/2023, 9:34 AM

## 2023-06-07 ENCOUNTER — Encounter: Payer: Self-pay | Admitting: Student in an Organized Health Care Education/Training Program

## 2023-06-07 ENCOUNTER — Ambulatory Visit
Payer: Medicare Other | Attending: Student in an Organized Health Care Education/Training Program | Admitting: Student in an Organized Health Care Education/Training Program

## 2023-06-07 VITALS — BP 176/83 | HR 78 | Temp 97.9°F | Resp 16 | Ht 71.0 in | Wt 172.0 lb

## 2023-06-07 DIAGNOSIS — M1612 Unilateral primary osteoarthritis, left hip: Secondary | ICD-10-CM | POA: Insufficient documentation

## 2023-06-07 DIAGNOSIS — M47816 Spondylosis without myelopathy or radiculopathy, lumbar region: Secondary | ICD-10-CM | POA: Diagnosis present

## 2023-06-07 DIAGNOSIS — M5136 Other intervertebral disc degeneration, lumbar region: Secondary | ICD-10-CM | POA: Insufficient documentation

## 2023-06-07 DIAGNOSIS — M47812 Spondylosis without myelopathy or radiculopathy, cervical region: Secondary | ICD-10-CM | POA: Insufficient documentation

## 2023-06-07 DIAGNOSIS — G894 Chronic pain syndrome: Secondary | ICD-10-CM | POA: Diagnosis not present

## 2023-06-07 DIAGNOSIS — M5412 Radiculopathy, cervical region: Secondary | ICD-10-CM | POA: Insufficient documentation

## 2023-06-07 MED ORDER — HYDROCODONE-ACETAMINOPHEN 10-325 MG PO TABS: 1.0000 | ORAL_TABLET | Freq: Two times a day (BID) | ORAL | 0 refills | Status: AC | PRN

## 2023-06-07 NOTE — Progress Notes (Signed)
Nursing Pain Medication Assessment:  Safety precautions to be maintained throughout the outpatient stay will include: orient to surroundings, keep bed in low position, maintain call bell within reach at all times, provide assistance with transfer out of bed and ambulation.  Medication Inspection Compliance: Andres Torres did not comply with our request to bring his pills to be counted. He was reminded that bringing the medication bottles, even when empty, is a requirement.  Medication: None brought in. Pill/Patch Count: None available to be counted. Bottle Appearance: No container available. Did not bring bottle(s) to appointment. Filled Date: N/A Last Medication intake:  Ran out of medicine more than 48 hours ago Safety precautions to be maintained throughout the outpatient stay will include: orient to surroundings, keep bed in low position, maintain call bell within reach at all times, provide assistance with transfer out of bed and ambulation.

## 2023-06-07 NOTE — Progress Notes (Signed)
PROVIDER NOTE: Information contained herein reflects review and annotations entered in association with encounter. Interpretation of such information and data should be left to medically-trained personnel. Information provided to patient can be located elsewhere in the medical record under "Patient Instructions". Document created using STT-dictation technology, any transcriptional errors that may result from process are unintentional.    Patient: Andres Torres  Service Category: E/M  Provider: Jotham Jolly, MD  DOB: 02/06/1951  DOS: 06/07/2023  Specialty: Interventional Pain Management  MRN: [id number]  Setting: Ambulatory outpatient  PCP: Sandrea Hughs, NP  Type: Established Patient    Referring Provider: Sandrea Hughs, NP  Location: Office  Delivery: Face-to-face     HPI  [name], a 72 y.o. year old male, is here today because of his Chronic pain syndrome [G89.4]. Mr. Blank primary complain today is Hip Pain (left) and Neck Pain  Last encounter: My last encounter with him was on 03/01/23  Pertinent problems: Mr. Rainville has Localized osteoarthrosis of left hip; Foot arch pain; Chronic pain syndrome; Lumbar degenerative disc disease; Chronic right shoulder pain; Lumbar spondylosis; Lumbar facet joint pain; and Cervical facet joint syndrome on their pertinent problem list. Pain Assessment: Severity of Chronic pain is reported as a 7 /10. Location: Hip Left/ . Onset:  . Quality: Throbbing, Sharp. Timing:  . Modifying factor(s):  Marland Kitchen Vitals:  height is 5\' 11"  (1.803 m) and weight is 172 lb (78 kg). His temporal temperature is 97.9 F (36.6 C). His blood pressure is 176/83 (abnormal) and his pulse is 78. His respiration is 16 and oxygen saturation is 99%.   Reason for encounter: medication management.    -Had an ED visit today for TIA 06/04/23 -troponin was negative. EKG without concerning abnormalities. In terms of the dizziness CT scan was obtained which did show an chronic appearing lesion  in cerebellum.  -No weakness or sensory deficits -No vision impairment  -Some auditory imbalance/discrepancy -Otherwise, pain well managed, here for refill of Hydrocodone  Pharmacotherapy Assessment  Analgesic: Hydrocodone 7.5 mg twice daily as needed , quantity 60/month   Monitoring: [place] PMP: PDMP reviewed during this encounter.       Pharmacotherapy: No side-effects or adverse reactions reported. Compliance: No problems identified. Effectiveness: Clinically acceptable.  UDS:  Summary  Date Value Ref Range Status  06/06/2022 Note  Final    Comment:    ==================================================================== [facility] Select 13 (MW) ==================================================================== Test                             Result       Flag       Units  Drug Present and Declared for Prescription Verification   Oxycodone                      214          EXPECTED   ng/mg creat   Oxymorphone                    280          EXPECTED   ng/mg creat   Noroxycodone                   429          EXPECTED   ng/mg creat   Noroxymorphone                 142  EXPECTED   ng/mg creat    Sources of oxycodone are scheduled prescription medications.    Oxymorphone, noroxycodone, and noroxymorphone are expected    metabolites of oxycodone. Oxymorphone is also available as a    scheduled prescription medication.  Drug Present not Declared for Prescription Verification   Carboxy-THC                    76           UNEXPECTED ng/mg creat    Carboxy-THC is a metabolite of tetrahydrocannabinol (THC). Source of    THC is most commonly herbal marijuana or marijuana-based products,    but THC is also present in a scheduled prescription medication.    Trace amounts of THC can be present in hemp and cannabidiol (CBD)    products. This test is not intended to distinguish between delta-9-    tetrahydrocannabinol, the predominant form of THC in most herbal or     marijuana-based products, and delta-8-tetrahydrocannabinol.  Drug Absent but Declared for Prescription Verification   Hydrocodone                    Not Detected UNEXPECTED ng/mg creat ==================================================================== Test                      Result    Flag   Units      Ref Range   Creatinine              153              mg/dL      >=16 ==================================================================== Declared Medications:  The flagging and interpretation on this report are based on the  following declared medications.  Unexpected results may arise from  inaccuracies in the declared medications.   **Note: The testing scope of this panel includes these medications:   Hydrocodone (Norco)  Oxycodone   **Note: The testing scope of this panel does not include the  following reported medications:   Acetaminophen (Norco)  Albuterol (Ventolin HFA)  Atorvastatin (Lipitor)  Multivitamin  Trazodone (Desyrel)  Ubiquinone (CoQ10)  Vitamin C ==================================================================== For clinical consultation, please call [phone]. ====================================================================       ROS  Constitutional: Denies any fever or chills Gastrointestinal: No reported hemesis, hematochezia, vomiting, or acute GI distress Musculoskeletal:  Left knee pain, low back pain Neurological: No reported episodes of acute onset apraxia, aphasia, dysarthria, agnosia, amnesia, paralysis, loss of coordination, or loss of consciousness  Medication Review  Garlic, HYDROcodone-acetaminophen, albuterol, atorvastatin, multivitamin with minerals, sitaGLIPtin, and vitamin C  History Review  Allergy: Mr. Jou has No Known Allergies. Drug: Mr. Mcmanaway  reports that he does not currently use drugs. Alcohol:  reports that he does not currently use alcohol. Tobacco:  reports that he quit smoking about 5 years ago. His  smoking use included cigarettes. He has never used smokeless tobacco. Social: Mr. Paule  reports that he quit smoking about 5 years ago. His smoking use included cigarettes. He has never used smokeless tobacco. He reports that he does not currently use alcohol. He reports that he does not currently use drugs. Medical:  has a past medical history of Diabetes mellitus without complication (HCC), Hypertension, and Sick sinus syndrome (HCC). Surgical: Mr. Casillas  has a past surgical history that includes PACEMAKER LEADLESS INSERTION (N/A, 08/28/2019) and TEMPORARY PACEMAKER (Right, 08/28/2019). Family: family history includes Arthritis in his mother; Diabetes in his father; Kidney disease in his  father.  Laboratory Chemistry Profile   Renal Lab Results  Component Value Date   BUN 16 06/05/2023   CREATININE 0.98 06/05/2023   BCR 10 04/23/2018   GFRAA >60 08/29/2019   GFRNONAA >60 06/05/2023     Hepatic Lab Results  Component Value Date   AST 28 06/04/2023   ALT 25 06/04/2023   ALBUMIN 4.4 06/04/2023   ALKPHOS 58 06/04/2023     Electrolytes Lab Results  Component Value Date   NA 136 06/05/2023   K 4.0 06/05/2023   CL 105 06/05/2023   CALCIUM 8.5 (L) 06/05/2023   MG 2.2 08/10/2019   PHOS 3.8 08/10/2019     Bone No results found for: "VD25OH", "VD125OH2TOT", "ZO1096EA5", "WU9811BJ4", "25OHVITD1", "25OHVITD2", "25OHVITD3", "TESTOFREE", "TESTOSTERONE"   Inflammation (CRP: Acute Phase) (ESR: Chronic Phase) No results found for: "CRP", "ESRSEDRATE", "LATICACIDVEN"     Note: Above Lab results reviewed.   Physical Exam  General appearance: Well nourished, well developed, and well hydrated. In no apparent acute distress Mental status: Alert, oriented x 3 (person, place, & time)       Respiratory: No evidence of acute respiratory distress Eyes: PERLA Vitals: BP (!) 176/83   Pulse 78   Temp 97.9 F (36.6 C) (Temporal)   Resp 16   Ht 5\' 11"  (1.803 m)   Wt 172 lb (78 kg)   SpO2  99%   BMI 23.99 kg/m  BMI: Estimated body mass index is 23.99 kg/m as calculated from the following:   Height as of this encounter: 5\' 11"  (1.803 m).   Weight as of this encounter: 172 lb (78 kg). Ideal: Ideal body weight: 75.3 kg (166 lb 0.1 oz) Adjusted ideal body weight: 76.4 kg (168 lb 6.5 oz)   Bilateral hip pain  Gait & Posture Assessment  Ambulation: Unassisted Gait: Relatively normal for age and body habitus Posture: WNL  Lower Extremity Exam    Side: Right lower extremity  Side: Left lower extremity  Stability: No instability observed          Stability: No instability observed          Skin & Extremity Inspection: Skin color, temperature, and hair growth are WNL. No peripheral edema or cyanosis. No masses, redness, swelling, asymmetry, or associated skin lesions. No contractures.  Skin & Extremity Inspection: Skin color, temperature, and hair growth are WNL. No peripheral edema or cyanosis. No masses, redness, swelling, asymmetry, or associated skin lesions. No contractures.  Functional ROM: Unrestricted ROM                  Functional ROM: Pain restricted ROM for hip and knee joints          Muscle Tone/Strength: Functionally intact. No obvious neuro-muscular anomalies detected.  Muscle Tone/Strength: Functionally intact. No obvious neuro-muscular anomalies detected.  Sensory (Neurological): Unimpaired        Sensory (Neurological): Arthropathic arthralgia        DTR: Patellar: deferred today Achilles: deferred today Plantar: deferred today  DTR: Patellar: deferred today Achilles: deferred today Plantar: deferred today  Palpation: No palpable anomalies  Palpation: No palpable anomalies    Assessment   Status Diagnosis  Controlled Controlled Controlled 1. Chronic pain syndrome   2. Cervical radiculopathy   3. Cervical facet joint syndrome   4. Lumbar degenerative disc disease   5. Localized osteoarthrosis of left hip   6. Lumbar spondylosis         Plan of  Care   [name]  has a current medication list which includes the following long-term medication(s): albuterol and atorvastatin.  Pharmacotherapy (Medications Ordered): Meds ordered this encounter  Medications   HYDROcodone-acetaminophen (NORCO) 10-325 MG tablet    Sig: Take 1 tablet by mouth every 12 (twelve) hours as needed.    Dispense:  60 tablet    Refill:  0   HYDROcodone-acetaminophen (NORCO) 10-325 MG tablet    Sig: Take 1 tablet by mouth every 12 (twelve) hours as needed.    Dispense:  60 tablet    Refill:  0   HYDROcodone-acetaminophen (NORCO) 10-325 MG tablet    Sig: Take 1 tablet by mouth every 12 (twelve) hours as needed.    Dispense:  60 tablet    Refill:  0   Orders Placed This Encounter  Procedures   [facility] Select 13 (MW), Urine    Volume: 30 ml(s). Minimum 3 ml of urine is needed. Document temperature of fresh sample. Indications: Long term (current) use of opiate analgesic [phone])    Order Specific Question:   Release to patient    Answer:   Immediate     Follow-up plan:   Return in about 3 months (around 09/07/2023) for Medication Management, in person.   Recent Visits No visits were found meeting these conditions. Showing recent visits within past 90 days and meeting all other requirements Today's Visits Date Type Provider Dept  06/07/23 Office Visit Burnham Jolly, MD [facility]  Showing today's visits and meeting all other requirements Future Appointments Date Type Provider Dept  08/30/23 Appointment Kaiven Jolly, MD [facility]  Showing future appointments within next 90 days and meeting all other requirements  I discussed the assessment and treatment plan with the patient. The patient was provided an opportunity to ask questions and all were answered. The patient agreed with the plan and demonstrated an understanding of the instructions.  Patient advised to call back or seek an in-person evaluation if the  symptoms or condition worsens.  Duration of encounter:30 minutes.  Note by: Acel Jolly, MD Date: 06/07/2023; Time: 3:37 PM

## 2023-07-05 ENCOUNTER — Telehealth: Payer: Self-pay | Admitting: Student in an Organized Health Care Education/Training Program

## 2023-07-05 NOTE — Telephone Encounter (Signed)
Called pharmacy and they can not fill this medication until 07/09/23 unless there is a new prescriptionwritten to be filled 07/06/23. I called the patient earlier and he understands that it has to last 30 days. Can you fill for 07/06/23?

## 2023-07-05 NOTE — Telephone Encounter (Signed)
Called patient and would like to pick up early related to going out of town with his daughter. Instructed that he needs to take on the day it ws due. Patient with understanding

## 2023-07-05 NOTE — Telephone Encounter (Signed)
Patient is going out of town with family. His meds are due to fill on Sunday and he will not be back, his pharmacy is closed on Sunday. They are wanting to know if the meds could be picked up tomorrow Morning. They will be leaving as soon as they can pick up the meds

## 2023-08-29 ENCOUNTER — Ambulatory Visit: Payer: Medicare Other | Attending: Cardiology | Admitting: Cardiology

## 2023-08-30 ENCOUNTER — Encounter: Payer: Self-pay | Admitting: Cardiology

## 2023-08-30 ENCOUNTER — Encounter: Payer: Medicare Other | Admitting: Student in an Organized Health Care Education/Training Program

## 2023-09-19 ENCOUNTER — Telehealth: Payer: Self-pay | Admitting: Student in an Organized Health Care Education/Training Program

## 2023-09-19 NOTE — Telephone Encounter (Signed)
Called patient, he is currently under the care of [facility], who happened to be there at the time of the call. They will arrange appt with Dr. Cherylann Ratel.

## 2023-09-19 NOTE — Telephone Encounter (Signed)
PT called to schedule MM appt. PT had appt 08-30-23 , was a no show. PT is currently out of medications. The earliest I can schedule patient for will be 10-16-23. So patient wants to see what can be done. Please give patient a call. TY

## 2023-11-13 ENCOUNTER — Ambulatory Visit
Payer: Medicare (Managed Care) | Attending: Student in an Organized Health Care Education/Training Program | Admitting: Student in an Organized Health Care Education/Training Program

## 2023-11-13 ENCOUNTER — Encounter: Payer: Self-pay | Admitting: Student in an Organized Health Care Education/Training Program

## 2023-11-13 VITALS — BP 143/71 | HR 72 | Temp 97.2°F | Resp 16 | Ht 71.5 in | Wt 168.3 lb

## 2023-11-13 DIAGNOSIS — M47816 Spondylosis without myelopathy or radiculopathy, lumbar region: Secondary | ICD-10-CM | POA: Diagnosis present

## 2023-11-13 DIAGNOSIS — M1612 Unilateral primary osteoarthritis, left hip: Secondary | ICD-10-CM | POA: Diagnosis present

## 2023-11-13 DIAGNOSIS — M47812 Spondylosis without myelopathy or radiculopathy, cervical region: Secondary | ICD-10-CM

## 2023-11-13 DIAGNOSIS — M5412 Radiculopathy, cervical region: Secondary | ICD-10-CM

## 2023-11-13 DIAGNOSIS — M51362 Other intervertebral disc degeneration, lumbar region with discogenic back pain and lower extremity pain: Secondary | ICD-10-CM | POA: Diagnosis present

## 2023-11-13 DIAGNOSIS — G894 Chronic pain syndrome: Secondary | ICD-10-CM | POA: Diagnosis not present

## 2023-11-13 MED ORDER — HYDROCODONE-ACETAMINOPHEN 10-325 MG PO TABS: 1.0000 | ORAL_TABLET | Freq: Two times a day (BID) | ORAL | 0 refills | Status: AC | PRN

## 2023-11-13 NOTE — Progress Notes (Signed)
 PROVIDER NOTE: Information contained herein reflects review and annotations entered in association with encounter. Interpretation of such information and data should be left to medically-trained personnel. Information provided to patient can be located elsewhere in the medical record under Patient Instructions. Document created using STT-dictation technology, any transcriptional errors that may result from process are unintentional.    Patient: Andres Torres  Service Category: E/M  Provider: Wallie Sherry, MD  DOB: March 02, 1951  DOS: 11/13/2023  Referring Provider: Era Raisin, NP  MRN: [id number]  Specialty: Interventional Pain Management  PCP: Era Raisin, NP  Type: Established Patient  Setting: Ambulatory outpatient    Location: Office  Delivery: Face-to-face     HPI  [name], a 72 y.o. year old male, is here today because of his Chronic pain syndrome [G89.4]. Mr. Normington primary complain today is Hip Pain (left), Neck Pain, Knee Pain (left), and Peripheral Neuropathy (B/l feet)  Pertinent problems: Mr. Zechman has Localized osteoarthrosis of left hip; Foot arch pain; Chronic pain syndrome; Lumbar degenerative disc disease; Chronic right shoulder pain; Lumbar spondylosis; Lumbar facet joint pain; and Cervical facet joint syndrome on their pertinent problem list. Pain Assessment: Severity of Chronic pain is reported as a 9 /10. Location: Hip Left/to lower back. Onset: More than a month ago. Quality: Aching, Dull, Sharp. Timing: Constant. Modifying factor(s): meds. Vitals:  height is 5' 11.5 (1.816 m) and weight is 168 lb 4.8 oz (76.3 kg). His temperature is 97.2 F (36.2 C) (abnormal). His blood pressure is 143/71 (abnormal) and his pulse is 72. His respiration is 16 and oxygen saturation is 98%.  BMI: Estimated body mass index is 23.15 kg/m as calculated from the following:   Height as of this encounter: 5' 11.5 (1.816 m).   Weight as of this encounter: 168 lb 4.8 oz (76.3  kg). Last encounter: 06/07/2023. Last procedure: Visit date not found.  Reason for encounter: medication management.   Discussed the use of AI scribe software for clinical note transcription with the patient, who gave verbal consent to proceed.  History of Present Illness   The patient, with a history of chronic pain, presented with concerns about his current pain medication, Oxycodone . He reported that the medication affected his brain in a way that Hydrocodone  did not. The patient admitted to using other medications not prescribed by his doctor, including Morphine  and THC, which were confirmed by a recent urine screen. He explained that he had received these medications from a friend who had been injured. The patient also admitted to using [facility] gummies for pain management.  The patient had recently spent time at [facility], a senior care facility, but found the experience inadequate. He felt that his health was better than most of the other residents and that the facility's resources would be better spent on those with more serious infirmities. The patient expressed a desire to return to his previous pain medication regimen.  The patient also shared some personal information, including his age (72 years old), his active involvement in his faith, and his joy in spending time with his grandchildren and great-grandchildren. He also mentioned a history of physical activity and injuries from his younger years, which may contribute to his current pain issues.       Pharmacotherapy Assessment  Analgesic:  Hydrocodone  10 mg twice daily as needed, quantity 60/month   Monitoring: [place] PMP: PDMP reviewed during this encounter.       Pharmacotherapy: No side-effects or adverse reactions reported. Compliance: No problems identified. Effectiveness: Clinically  acceptable.  Dayna Pulling, RN  11/13/2023  2:00 PM  Sign when Signing Visit Nursing Pain Medication Assessment:  Safety precautions to be maintained  throughout the outpatient stay will include: orient to surroundings, keep bed in low position, maintain call bell within reach at all times, provide assistance with transfer out of bed and ambulation.  Medication Inspection Compliance: Mr. Blanchette did not comply with our request to bring his pills to be counted. He was reminded that bringing the medication bottles, even when empty, is a requirement.  Medication: None brought in. Pill/Patch Count: None available to be counted. Bottle Appearance: No container available. Did not bring bottle(s) to appointment. Filled Date: N/A Last Medication intake:  Ran out of medicine more than 48 hours ago    No results found for: CBDTHCR No results found for: D8THCCBX No results found for: D9THCCBX  UDS:  Summary  Date Value Ref Range Status  06/07/2023 Note  Final    Comment:    ==================================================================== [employer] Select 13 (MW) ==================================================================== Test                             Result       Flag       Units  Drug Present and Declared for Prescription Verification   Hydrocodone                     190          EXPECTED   ng/mg creat   Hydromorphone                  147          EXPECTED   ng/mg creat   Dihydrocodeine                 74           EXPECTED   ng/mg creat   Norhydrocodone                 169          EXPECTED   ng/mg creat    Sources of hydrocodone  include scheduled prescription medications.    Hydromorphone, dihydrocodeine and norhydrocodone are expected    metabolites of hydrocodone . Hydromorphone and dihydrocodeine are    also available as scheduled prescription medications.  Drug Present not Declared for Prescription Verification   Carboxy-THC                    18           UNEXPECTED ng/mg creat    Carboxy-THC is a metabolite of tetrahydrocannabinol (THC). Source of    THC is most commonly herbal marijuana or marijuana-based  products,    but THC is also present in a scheduled prescription medication.    Trace amounts of THC can be present in hemp and cannabidiol (CBD)    products. This test is not intended to distinguish between delta-9-    tetrahydrocannabinol, the predominant form of THC in most herbal or    marijuana-based products, and delta-8-tetrahydrocannabinol.    Morphine                        61           UNEXPECTED ng/mg creat    Potential sources of morphine  include administration of codeine or    morphine , use of heroin, or ingestion of poppy seeds.  Oxycodone                       342          UNEXPECTED ng/mg creat   Oxymorphone                    782          UNEXPECTED ng/mg creat   Noroxycodone                   965          UNEXPECTED ng/mg creat   Noroxymorphone                 208          UNEXPECTED ng/mg creat    Sources of oxycodone  are scheduled prescription medications.    Oxymorphone, noroxycodone, and noroxymorphone are expected    metabolites of oxycodone . Oxymorphone is also available as a    scheduled prescription medication.  ==================================================================== Test                      Result    Flag   Units      Ref Range   Creatinine              107              mg/dL      >=79 ==================================================================== Declared Medications:  The flagging and interpretation on this report are based on the  following declared medications.  Unexpected results may arise from  inaccuracies in the declared medications.   **Note: The testing scope of this panel includes these medications:   Hydrocodone  (Norco)   **Note: The testing scope of this panel does not include the  following reported medications:   Acetaminophen  (Norco)  Albuterol   Atorvastatin  (Lipitor)  Multivitamin  Sitagliptin (Januvia)  Supplement  Vitamin C  ==================================================================== For clinical  consultation, please call [phone]. ====================================================================       ROS  Constitutional: Denies any fever or chills Gastrointestinal: No reported hemesis, hematochezia, vomiting, or acute GI distress Musculoskeletal:  as above, left hip, left knee, left low back,bilateral feet Neurological: No reported episodes of acute onset apraxia, aphasia, dysarthria, agnosia, amnesia, paralysis, loss of coordination, or loss of consciousness  Medication Review  Garlic, HYDROcodone -acetaminophen , albuterol , atorvastatin , multivitamin with minerals, sitaGLIPtin, and vitamin C   History Review  Allergy: Mr. Cheramie has no known allergies. Drug: Mr. Schmieder  reports that he does not currently use drugs. Alcohol:  reports that he does not currently use alcohol. Tobacco:  reports that he quit smoking about 5 years ago. His smoking use included cigarettes. He has never used smokeless tobacco. Social: Mr. Tramell  reports that he quit smoking about 5 years ago. His smoking use included cigarettes. He has never used smokeless tobacco. He reports that he does not currently use alcohol. He reports that he does not currently use drugs. Medical:  has a past medical history of Diabetes mellitus without complication (HCC), Hypertension, and Sick sinus syndrome (HCC). Surgical: Mr. Montoya  has a past surgical history that includes PACEMAKER LEADLESS INSERTION (N/A, 08/28/2019) and TEMPORARY PACEMAKER (Right, 08/28/2019). Family: family history includes Arthritis in his mother; Diabetes in his father; Kidney disease in his father.  Laboratory Chemistry Profile   Renal Lab Results  Component Value Date   BUN 16 06/05/2023   CREATININE 0.98 06/05/2023   BCR 10 04/23/2018   GFRAA >60 08/29/2019  GFRNONAA >60 06/05/2023    Hepatic Lab Results  Component Value Date   AST 28 06/04/2023   ALT 25 06/04/2023   ALBUMIN 4.4 06/04/2023   ALKPHOS 58 06/04/2023     Electrolytes Lab Results  Component Value Date   NA 136 06/05/2023   K 4.0 06/05/2023   CL 105 06/05/2023   CALCIUM  8.5 (L) 06/05/2023   MG 2.2 08/10/2019   PHOS 3.8 08/10/2019    Bone No results found for: VD25OH, VD125OH2TOT, CI6874NY7, CI7874NY7, 25OHVITD1, 25OHVITD2, 25OHVITD3, TESTOFREE, TESTOSTERONE  Inflammation (CRP: Acute Phase) (ESR: Chronic Phase) No results found for: CRP, ESRSEDRATE, LATICACIDVEN       Note: Above Lab results reviewed.  Recent Imaging Review  CT HEAD WO CONTRAST CLINICAL DATA:  Neuro deficit, acute, stroke suspected.  Follow-up.  EXAM: CT HEAD WITHOUT CONTRAST  TECHNIQUE: Contiguous axial images were obtained from the base of the skull through the vertex without intravenous contrast.  RADIATION DOSE REDUCTION: This exam was performed according to the departmental dose-optimization program which includes automated exposure control, adjustment of the mA and/or kV according to patient size and/or use of iterative reconstruction technique.  COMPARISON:  Head CT 06/04/2023.  FINDINGS: Brain: No acute hemorrhage. 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 Mild generalized volume loss. No acute hydrocephalus or extra-axial collection. No mass effect or midline shift.  Vascular: Circulating contrast from recent CTA.  Skull: No calvarial fracture or suspicious bone lesion. Skull base is unremarkable.  Sinuses/Orbits: Unremarkable.  Other: None.  IMPRESSION: 1. No acute intracranial hemorrhage. 2. 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  Electronically Signed   By: Ryan Chess M.D.   On: 06/05/2023 14:14 ECHOCARDIOGRAM COMPLETE BUBBLE STUDY    ECHOCARDIOGRAM REPORT       Patient Name:   CORIAN HANDLEY Date of Exam: 06/05/2023 Medical Rec #:  [id number]     Height:       71.0 in Accession #:    [id number]    Weight:       175.0  lb Date of Birth:  [birth year]      BSA:          1.992 m Patient Age:    72 years      BP:           142/80 mmHg Patient Gender: M             HR:           120 bpm. Exam Location:  [facility]  Procedure: 2D Echo, Cardiac Doppler, Color Doppler and Saline Contrast Bubble            Study  Indications:     Stroke 434.91 /I63.9   History:         Patient has prior history of Echocardiogram examinations, most                  recent 08/10/2019. Risk Factors:Diabetes and Hypertension.   Sonographer:     Christopher Furnace Referring Phys:  [id number] JAN A MANSY Diagnosing Phys: Deatrice Cage MD    Sonographer Comments: Image acquisition challenging due to respiratory motion. IMPRESSIONS   1. Left ventricular ejection fraction, by estimation, is 55 to 60%. The left ventricle has normal function. The left ventricle has no regional wall motion abnormalities. There is mild asymmetric left ventricular hypertrophy of the septal segment. Left  ventricular diastolic parameters are consistent with Grade I diastolic dysfunction (impaired relaxation).  2. Right  ventricular systolic function is normal. The right ventricular size is normal. Tricuspid regurgitation signal is inadequate for assessing PA pressure.  3. The mitral valve is normal in structure. Trivial mitral valve regurgitation. No evidence of mitral stenosis.  4. The aortic valve is normal in structure. Aortic valve regurgitation is not visualized. Aortic valve sclerosis/calcification is present, without any evidence of aortic stenosis.  5. The inferior vena cava is normal in size with greater than 50% respiratory variability, suggesting right atrial pressure of 3 mmHg.  6. Agitated saline contrast bubble study was negative, with no evidence of any interatrial shunt.  FINDINGS  Left Ventricle: Left ventricular ejection fraction, by estimation, is 55 to 60%. The left ventricle has normal function. The left ventricle has no regional wall motion abnormalities.  The left ventricular internal cavity size was normal in size. There is  mild asymmetric left ventricular hypertrophy of the septal segment. Left ventricular diastolic parameters are consistent with Grade I diastolic dysfunction (impaired relaxation).  Right Ventricle: The right ventricular size is normal. No increase in right ventricular wall thickness. Right ventricular systolic function is normal. Tricuspid regurgitation signal is inadequate for assessing PA pressure. The tricuspid regurgitant  velocity is 1.67 m/s, and with an assumed right atrial pressure of 3 mmHg, the estimated right ventricular systolic pressure is 14.2 mmHg.  Left Atrium: Left atrial size was normal in size.  Right Atrium: Right atrial size was normal in size.  Pericardium: There is no evidence of pericardial effusion.  Mitral Valve: The mitral valve is normal in structure. Trivial mitral valve regurgitation. No evidence of mitral valve stenosis. MV peak gradient, 14.7 mmHg. The mean mitral valve gradient is 6.0 mmHg.  Tricuspid Valve: The tricuspid valve is normal in structure. Tricuspid valve regurgitation is not demonstrated. No evidence of tricuspid stenosis.  Aortic Valve: The aortic valve is normal in structure. Aortic valve regurgitation is not visualized. Aortic valve sclerosis/calcification is present, without any evidence of aortic stenosis. Aortic valve mean gradient measures 4.0 mmHg. Aortic valve peak  gradient measures 8.3 mmHg. Aortic valve area, by VTI measures 2.79 cm.  Pulmonic Valve: The pulmonic valve was normal in structure. Pulmonic valve regurgitation is not visualized. No evidence of pulmonic stenosis.  Aorta: The aortic root is normal in size and structure.  Venous: The inferior vena cava is normal in size with greater than 50% respiratory variability, suggesting right atrial pressure of 3 mmHg.  IAS/Shunts: No atrial level shunt detected by color flow Doppler. Agitated saline contrast was  given intravenously to evaluate for intracardiac shunting. Agitated saline contrast bubble study was negative, with no evidence of any interatrial shunt.    LEFT VENTRICLE PLAX 2D LVIDd:         4.20 cm   Diastology LVIDs:         2.50 cm   LV e' medial:    5.55 cm/s LV PW:         0.90 cm   LV E/e' medial:  13.8 LV IVS:        1.50 cm   LV e' lateral:   6.96 cm/s LVOT diam:     2.00 cm   LV E/e' lateral: 11.0 LV SV:         82 LV SV Index:   41 LVOT Area:     3.14 cm    RIGHT VENTRICLE RV Basal diam:  3.80 cm RV Mid diam:    3.00 cm RV S prime:     9.79 cm/s  TAPSE (M-mode): 2.3 cm  LEFT ATRIUM           Index        RIGHT ATRIUM           Index LA diam:      2.90 cm 1.46 cm/m   RA Area:     13.50 cm LA Vol (A2C): 37.9 ml 19.02 ml/m  RA Volume:   34.20 ml  17.17 ml/m LA Vol (A4C): 16.9 ml 8.48 ml/m  AORTIC VALVE AV Area (Vmax):    2.47 cm AV Area (Vmean):   2.64 cm AV Area (VTI):     2.79 cm AV Vmax:           144.00 cm/s AV Vmean:          88.100 cm/s AV VTI:            0.293 m AV Peak Grad:      8.3 mmHg AV Mean Grad:      4.0 mmHg LVOT Vmax:         113.00 cm/s LVOT Vmean:        74.000 cm/s LVOT VTI:          0.260 m LVOT/AV VTI ratio: 0.89   AORTA Ao Root diam: 3.40 cm  MITRAL VALVE                TRICUSPID VALVE MV Area (PHT): 2.65 cm     TR Peak grad:   11.2 mmHg MV Area VTI:   1.80 cm     TR Vmax:        167.00 cm/s MV Peak grad:  14.7 mmHg MV Mean grad:  6.0 mmHg     SHUNTS MV Vmax:       1.92 m/s     Systemic VTI:  0.26 m MV Vmean:      117.0 cm/s   Systemic Diam: 2.00 cm MV Decel Time: 286 msec MV E velocity: 76.50 cm/s MV A velocity: 154.00 cm/s MV E/A ratio:  0.50  Deatrice Cage MD Electronically signed by Deatrice Cage MD Signature Date/Time: 06/05/2023/12:56:37 PM      Final   Note: Reviewed        Physical Exam  General appearance: Well nourished, well developed, and well hydrated. In no apparent acute distress Mental status:  Alert, oriented x 3 (person, place, & time)       Respiratory: No evidence of acute respiratory distress Eyes: PERLA Vitals: BP (!) 143/71   Pulse 72   Temp (!) 97.2 F (36.2 C)   Resp 16   Ht 5' 11.5 (1.816 m)   Wt 168 lb 4.8 oz (76.3 kg)   SpO2 98%   BMI 23.15 kg/m  BMI: Estimated body mass index is 23.15 kg/m as calculated from the following:   Height as of this encounter: 5' 11.5 (1.816 m).   Weight as of this encounter: 168 lb 4.8 oz (76.3 kg). Ideal: Ideal body weight: 76.5 kg (168 lb 8.7 oz)  Assessment   Diagnosis Status  1. Chronic pain syndrome   2. Cervical radiculopathy   3. Cervical facet joint syndrome   4. Degeneration of intervertebral disc of lumbar region with discogenic back pain and lower extremity pain   5. Lumbar spondylosis   6. Localized osteoarthrosis of left hip    Persistent Persistent Persistent   Updated Problems: No problems updated.  Plan of Care  Problem-specific:  Assessment and Plan    Chronic Pain Management  He manages his chronic pain with hydrocodone  but has reported that his current medication, oxycodone , affects his brain differently. His last urine screen revealed morphine , oxycodone , and THC, none of which were prescribed. He admitted to obtaining medications from a friend and using THC gummies. We emphasized the importance of avoiding non-prescribed medications, especially while on controlled substances, and discussed the risks of overdose and interactions. We explained that continued misuse could lead to the discontinuation of pain management services. We will refill hydrocodone , prescribing 60 tablets for 30 days to be taken every 12 hours, and order a repeat urine drug screen.  General Health Maintenance   We discussed the importance of nutrition and supplements, highlighting his significance for brain development in children and grandchildren. We emphasized the need for whole food prenatal vitamins and early supplementation  for optimal neurological development. We encouraged the use of whole food prenatal vitamins for family members and discussed the importance of early brain development and proper nutrition with the family.  Follow-up   We will schedule a follow-up appointment before the new year and ensure the results of the repeat urine drug screen are reviewed at the next visit.       Mr. Chou Busler has a current medication list which includes the following long-term medication(s): albuterol  and atorvastatin .  Pharmacotherapy (Medications Ordered): Meds ordered this encounter  Medications   HYDROcodone -acetaminophen  (NORCO) 10-325 MG tablet    Sig: Take 1 tablet by mouth every 12 (twelve) hours as needed for severe pain (pain score 7-10). Must last 30 days.    Dispense:  60 tablet    Refill:  0    Chronic Pain: STOP Act (Not applicable) Fill 1 day early if closed on refill date. Avoid benzodiazepines within 8 hours of opioids   HYDROcodone -acetaminophen  (NORCO) 10-325 MG tablet    Sig: Take 1 tablet by mouth every 12 (twelve) hours as needed for severe pain (pain score 7-10). Must last 30 days.    Dispense:  60 tablet    Refill:  0    Chronic Pain: STOP Act (Not applicable) Fill 1 day early if closed on refill date. Avoid benzodiazepines within 8 hours of opioids   Orders:  Orders Placed This Encounter  Procedures   [employer] Select 13 (MW), Urine    Volume: 30 ml(s). Minimum 3 ml of urine is needed. Document temperature of fresh sample. Indications: Long term (current) use of opiate analgesic (S20.108)    Release to patient:   Immediate   Follow-up plan:   Return in about 8 weeks (around 01/08/2024) for MM, F2F.      Recent Visits No visits were found meeting these conditions. Showing recent visits within past 90 days and meeting all other requirements Today's Visits Date Type Provider Dept  11/13/23 Office Visit Marcelino Nurse, MD [facility]  Showing today's visits and  meeting all other requirements Future Appointments No visits were found meeting these conditions. Showing future appointments within next 90 days and meeting all other requirements  I discussed the assessment and treatment plan with the patient. The patient was provided an opportunity to ask questions and all were answered. The patient agreed with the plan and demonstrated an understanding of the instructions.  Patient advised to call back or seek an in-person evaluation if the symptoms or condition worsens.  Duration of encounter: .  Total time on encounter, as per AMA guidelines included both the face-to-face and non-face-to-face time personally spent by the physician and/or other qualified health care  professional(s) on the day of the encounter (includes time in activities that require the physician or other qualified health care professional and does not include time in activities normally performed by clinical staff). Physician's time may include the following activities when performed: Preparing to see the patient (e.g., pre-charting review of records, searching for previously ordered imaging, lab work, and nerve conduction tests) Review of prior analgesic pharmacotherapies. Reviewing PMP Interpreting ordered tests (e.g., lab work, imaging, nerve conduction tests) Performing post-procedure evaluations, including interpretation of diagnostic procedures Obtaining and/or reviewing separately obtained history Performing a medically appropriate examination and/or evaluation Counseling and educating the patient/family/caregiver Ordering medications, tests, or procedures Referring and communicating with other health care professionals (when not separately reported) Documenting clinical information in the electronic or other health record Independently interpreting results (not separately reported) and communicating results to the patient/ family/caregiver Care coordination (not  separately reported)  Note by: Wallie Sherry, MD Date: 11/13/2023; Time: 2:46 PM

## 2023-11-13 NOTE — Progress Notes (Signed)
 Nursing Pain Medication Assessment:  Safety precautions to be maintained throughout the outpatient stay will include: orient to surroundings, keep bed in low position, maintain call bell within reach at all times, provide assistance with transfer out of bed and ambulation.  Medication Inspection Compliance: Andres Torres did not comply with our request to bring his pills to be counted. He was reminded that bringing the medication bottles, even when empty, is a requirement.  Medication: None brought in. Pill/Patch Count: None available to be counted. Bottle Appearance: No container available. Did not bring bottle(s) to appointment. Filled Date: N/A Last Medication intake:  Ran out of medicine more than 48 hours ago

## 2023-11-16 LAB — TOXASSURE SELECT 13 (MW), URINE

## 2024-01-03 ENCOUNTER — Ambulatory Visit
Payer: Medicare (Managed Care) | Attending: Student in an Organized Health Care Education/Training Program | Admitting: Student in an Organized Health Care Education/Training Program

## 2024-01-07 ENCOUNTER — Telehealth: Payer: Self-pay | Admitting: Student in an Organized Health Care Education/Training Program

## 2024-01-07 NOTE — Telephone Encounter (Signed)
 [facility] called to reschedule Andres Torres for his next appt. They cannot get him here before March 13 and his meds are due to fill March 1st or 2nd. Transportation canceled last week appt due to the weather. I did inform Erie Noe at [facility] about this.

## 2024-01-14 ENCOUNTER — Encounter: Payer: Self-pay | Admitting: Student in an Organized Health Care Education/Training Program

## 2024-01-15 ENCOUNTER — Ambulatory Visit
Payer: Medicare (Managed Care) | Attending: Student in an Organized Health Care Education/Training Program | Admitting: Student in an Organized Health Care Education/Training Program

## 2024-01-15 DIAGNOSIS — M47812 Spondylosis without myelopathy or radiculopathy, cervical region: Secondary | ICD-10-CM

## 2024-01-15 DIAGNOSIS — M47816 Spondylosis without myelopathy or radiculopathy, lumbar region: Secondary | ICD-10-CM

## 2024-01-15 DIAGNOSIS — M51362 Other intervertebral disc degeneration, lumbar region with discogenic back pain and lower extremity pain: Secondary | ICD-10-CM

## 2024-01-15 DIAGNOSIS — M1612 Unilateral primary osteoarthritis, left hip: Secondary | ICD-10-CM

## 2024-01-15 DIAGNOSIS — M5412 Radiculopathy, cervical region: Secondary | ICD-10-CM

## 2024-01-15 DIAGNOSIS — G894 Chronic pain syndrome: Secondary | ICD-10-CM

## 2024-01-15 NOTE — Progress Notes (Signed)
 Patient: Andres Torres  Service Category: E/M  Provider: Markelle Jolly, MD  DOB: 01-30-1951  DOS: 01/15/2024  Location: Office  MRN: [id number]  Setting: Ambulatory outpatient  Referring Provider: Sandrea Hughs, NP  Type: Established Patient  Specialty: Interventional Pain Management  PCP: Sandrea Hughs, NP  Location: Remote location  Delivery: TeleHealth     Virtual Encounter - Pain Management PROVIDER NOTE: Information contained herein reflects review and annotations entered in association with encounter. Interpretation of such information and data should be left to medically-trained personnel. Information provided to patient can be located elsewhere in the medical record under "Patient Instructions". Document created using STT-dictation technology, any transcriptional errors that may result from process are unintentional.    Contact & Pharmacy Preferred: [phone] Home: [phone] (home) Mobile: [phone] (mobile) E-mail: [email or web address]  [facility] - [place], [place] - [address] [place] [address] Phone: [phone] Fax: [phone]  [facility] [place], [place] - [address] [place] [address] Phone: [phone] Fax: [phone]   Pre-screening  Mr. Johansson offered "in-person" vs "virtual" encounter. He indicated preferring virtual for this encounter.   Reason COVID-19*  Social distancing based on CDC and AMA recommendations.   I contacted Andres Torres on 01/15/2024 via telephone.      I clearly identified myself as Johntavious Jolly, MD. I verified that I was speaking with the correct person using two identifiers (Name: Dabney Schanz, and date of birth: 1951-03-09).  Consent I sought verbal advanced consent from Andres Torres for virtual visit interactions. I informed Mr. Tullis of possible security and privacy concerns, risks, and limitations associated with providing "not-in-person"  medical evaluation and management services. I also informed Mr. Camerer of the availability of "in-person" appointments. Finally, I informed him that there would be a charge for the virtual visit and that he could be  personally, fully or partially, financially responsible for it. Mr. Hallahan expressed understanding and agreed to proceed.   Historic Elements   Mr. Josiel Gahm is a 73 y.o. year old, male patient evaluated today after our last contact on 01/07/2024. Mr. Swift  has a past medical history of Diabetes mellitus without complication (HCC), Hypertension, and Sick sinus syndrome (HCC). He also  has a past surgical history that includes PACEMAKER LEADLESS INSERTION (N/A, 08/28/2019) and TEMPORARY PACEMAKER (Right, 08/28/2019). Mr. Chianese has a current medication list which includes the following prescription(s): albuterol, vitamin c, atorvastatin, garlic, multivitamin with minerals, and sitagliptin. He  reports that he quit smoking about 5 years ago. His smoking use included cigarettes. He has never used smokeless tobacco. He reports that he does not currently use alcohol. He reports that he does not currently use drugs. Mr. Lanni has no known allergies.  BMI: Estimated body mass index is 23.15 kg/m as calculated from the following:   Height as of 11/13/23: 5' 11.5" (1.816 m).   Weight as of 11/13/23: 168 lb 4.8 oz (76.3 kg). Last encounter: 11/13/2023. Last procedure: Visit date not found.  HPI  Today, he is being contacted for follow-up evaluation  Contacted patient regarding medication management.  He is dealing with an acute upper respiratory illness.  He has been having his medications filled by his primary care provider which is preferred.  His regimen has been stable at 10 mg twice daily.  I encouraged him to continue that with his PCP and to contact me for any worsening pain or if he is interested in any interventional  procedures.   Pharmacotherapy Assessment   Opioid Analgesic:  Hydrocodone 10 mg twice daily as needed, quantity 60/month   Monitoring: [place] PMP: PDMP reviewed during this encounter.       Pharmacotherapy: No side-effects or adverse reactions reported. Compliance: No problems identified. Effectiveness: Clinically acceptable. Plan: Refer to "POC". UDS:  Summary  Date Value Ref Range Status  11/13/2023 FINAL  Final    Comment:    ==================================================================== [employer] Select 13 (MW) ==================================================================== Test                             Result       Flag       Units  Drug Present not Declared for Prescription Verification   Oxycodone                      3714         UNEXPECTED ng/mg creat   Oxymorphone                    914          UNEXPECTED ng/mg creat   Noroxycodone                   2297         UNEXPECTED ng/mg creat   Noroxymorphone                 183          UNEXPECTED ng/mg creat    Sources of oxycodone are scheduled prescription medications.    Oxymorphone, noroxycodone, and noroxymorphone are expected    metabolites of oxycodone. Oxymorphone is also available as a    scheduled prescription medication.  Drug Absent but Declared for Prescription Verification   Hydrocodone                    Not Detected UNEXPECTED ng/mg creat ==================================================================== Test                      Result    Flag   Units      Ref Range   Creatinine              207              mg/dL      >=40 ==================================================================== Declared Medications:  The flagging and interpretation on this report are based on the  following declared medications.  Unexpected results may arise from  inaccuracies in the declared medications.   **Note: The testing scope of this panel includes these medications:   Hydrocodone (Norco)   **Note: The testing scope of this panel does not include the  following  reported medications:   Acetaminophen (Norco)  Albuterol (Ventolin HFA)  Atorvastatin (Lipitor)  Multivitamin  Sitagliptin (Januvia)  Supplement  Vitamin C ==================================================================== For clinical consultation, please call [phone]. ====================================================================    No results found for: "CBDTHCR", "D8THCCBX", "D9THCCBX"   Laboratory Chemistry Profile   Renal Lab Results  Component Value Date   BUN 16 06/05/2023   CREATININE 0.98 06/05/2023   BCR 10 04/23/2018   GFRAA >60 08/29/2019   GFRNONAA >60 06/05/2023    Hepatic Lab Results  Component Value Date   AST 28 06/04/2023   ALT 25 06/04/2023   ALBUMIN 4.4 06/04/2023   ALKPHOS 58 06/04/2023    Electrolytes Lab Results  Component Value Date  NA 136 06/05/2023   K 4.0 06/05/2023   CL 105 06/05/2023   CALCIUM 8.5 (L) 06/05/2023   MG 2.2 08/10/2019   PHOS 3.8 08/10/2019    Bone No results found for: "VD25OH", "VD125OH2TOT", "LK4401UU7", "OZ3664QI3", "25OHVITD1", "25OHVITD2", "25OHVITD3", "TESTOFREE", "TESTOSTERONE"  Inflammation (CRP: Acute Phase) (ESR: Chronic Phase) No results found for: "CRP", "ESRSEDRATE", "LATICACIDVEN"       Note: Above Lab results reviewed.  Imaging  CT HEAD WO CONTRAST CLINICAL DATA:  Neuro deficit, acute, stroke suspected.  Follow-up.  EXAM: CT HEAD WITHOUT CONTRAST  TECHNIQUE: Contiguous axial images were obtained from the base of the skull through the vertex without intravenous contrast.  RADIATION DOSE REDUCTION: This exam was performed according to the departmental dose-optimization program which includes automated exposure control, adjustment of the mA and/or kV according to patient size and/or use of iterative reconstruction technique.  COMPARISON:  Head CT 06/04/2023.  FINDINGS: Brain: No acute hemorrhage. 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  Mild generalized volume loss. No acute hydrocephalus or extra-axial collection. No mass effect or midline shift.  Vascular: Circulating contrast from recent CTA.  Skull: No calvarial fracture or suspicious bone lesion. Skull base is unremarkable.  Sinuses/Orbits: Unremarkable.  Other: None.  IMPRESSION: 1. No acute intracranial hemorrhage. 2. 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  Electronically Signed   By: Orvan Falconer M.D.   On: 06/05/2023 14:14 ECHOCARDIOGRAM COMPLETE BUBBLE STUDY    ECHOCARDIOGRAM REPORT       Patient Name:   MARQUEZE RAMCHARAN Date of Exam: 06/05/2023 Medical Rec #:  [id number]     Height:       71.0 in Accession #:    [id number]    Weight:       175.0 lb Date of Birth:  [birth year]      BSA:          1.992 m Patient Age:    72 years      BP:           142/80 mmHg Patient Gender: M             HR:           120 bpm. Exam Location:  [facility]  Procedure: 2D Echo, Cardiac Doppler, Color Doppler and Saline Contrast Bubble            Study  Indications:     Stroke 434.91 /I63.9   History:         Patient has prior history of Echocardiogram examinations, most                  recent 08/10/2019. Risk Factors:Diabetes and Hypertension.   Sonographer:     Cristela Blue Referring Phys:  [id number] JAN A MANSY Diagnosing Phys: Lorine Bears MD    Sonographer Comments: Image acquisition challenging due to respiratory motion. IMPRESSIONS   1. Left ventricular ejection fraction, by estimation, is 55 to 60%. The left ventricle has normal function. The left ventricle has no regional wall motion abnormalities. There is mild asymmetric left ventricular hypertrophy of the septal segment. Left  ventricular diastolic parameters are consistent with Grade I diastolic dysfunction (impaired relaxation).  2. Right ventricular systolic function is normal. The right ventricular size is normal. Tricuspid regurgitation signal is inadequate for  assessing PA pressure.  3. The mitral valve is normal in structure. Trivial mitral valve regurgitation. No evidence of mitral stenosis.  4. The aortic valve is normal in structure.  Aortic valve regurgitation is not visualized. Aortic valve sclerosis/calcification is present, without any evidence of aortic stenosis.  5. The inferior vena cava is normal in size with greater than 50% respiratory variability, suggesting right atrial pressure of 3 mmHg.  6. Agitated saline contrast bubble study was negative, with no evidence of any interatrial shunt.  FINDINGS  Left Ventricle: Left ventricular ejection fraction, by estimation, is 55 to 60%. The left ventricle has normal function. The left ventricle has no regional wall motion abnormalities. The left ventricular internal cavity size was normal in size. There is  mild asymmetric left ventricular hypertrophy of the septal segment. Left ventricular diastolic parameters are consistent with Grade I diastolic dysfunction (impaired relaxation).  Right Ventricle: The right ventricular size is normal. No increase in right ventricular wall thickness. Right ventricular systolic function is normal. Tricuspid regurgitation signal is inadequate for assessing PA pressure. The tricuspid regurgitant  velocity is 1.67 m/s, and with an assumed right atrial pressure of 3 mmHg, the estimated right ventricular systolic pressure is 14.2 mmHg.  Left Atrium: Left atrial size was normal in size.  Right Atrium: Right atrial size was normal in size.  Pericardium: There is no evidence of pericardial effusion.  Mitral Valve: The mitral valve is normal in structure. Trivial mitral valve regurgitation. No evidence of mitral valve stenosis. MV peak gradient, 14.7 mmHg. The mean mitral valve gradient is 6.0 mmHg.  Tricuspid Valve: The tricuspid valve is normal in structure. Tricuspid valve regurgitation is not demonstrated. No evidence of tricuspid stenosis.  Aortic Valve: The  aortic valve is normal in structure. Aortic valve regurgitation is not visualized. Aortic valve sclerosis/calcification is present, without any evidence of aortic stenosis. Aortic valve mean gradient measures 4.0 mmHg. Aortic valve peak  gradient measures 8.3 mmHg. Aortic valve area, by VTI measures 2.79 cm.  Pulmonic Valve: The pulmonic valve was normal in structure. Pulmonic valve regurgitation is not visualized. No evidence of pulmonic stenosis.  Aorta: The aortic root is normal in size and structure.  Venous: The inferior vena cava is normal in size with greater than 50% respiratory variability, suggesting right atrial pressure of 3 mmHg.  IAS/Shunts: No atrial level shunt detected by color flow Doppler. Agitated saline contrast was given intravenously to evaluate for intracardiac shunting. Agitated saline contrast bubble study was negative, with no evidence of any interatrial shunt.    LEFT VENTRICLE PLAX 2D LVIDd:         4.20 cm   Diastology LVIDs:         2.50 cm   LV e' medial:    5.55 cm/s LV PW:         0.90 cm   LV E/e' medial:  13.8 LV IVS:        1.50 cm   LV e' lateral:   6.96 cm/s LVOT diam:     2.00 cm   LV E/e' lateral: 11.0 LV SV:         82 LV SV Index:   41 LVOT Area:     3.14 cm    RIGHT VENTRICLE RV Basal diam:  3.80 cm RV Mid diam:    3.00 cm RV S prime:     9.79 cm/s TAPSE (M-mode): 2.3 cm  LEFT ATRIUM           Index        RIGHT ATRIUM           Index LA diam:      2.90 cm 1.46  cm/m   RA Area:     13.50 cm LA Vol (A2C): 37.9 ml 19.02 ml/m  RA Volume:   34.20 ml  17.17 ml/m LA Vol (A4C): 16.9 ml 8.48 ml/m  AORTIC VALVE AV Area (Vmax):    2.47 cm AV Area (Vmean):   2.64 cm AV Area (VTI):     2.79 cm AV Vmax:           144.00 cm/s AV Vmean:          88.100 cm/s AV VTI:            0.293 m AV Peak Grad:      8.3 mmHg AV Mean Grad:      4.0 mmHg LVOT Vmax:         113.00 cm/s LVOT Vmean:        74.000 cm/s LVOT VTI:          0.260 m LVOT/AV  VTI ratio: 0.89   AORTA Ao Root diam: 3.40 cm  MITRAL VALVE                TRICUSPID VALVE MV Area (PHT): 2.65 cm     TR Peak grad:   11.2 mmHg MV Area VTI:   1.80 cm     TR Vmax:        167.00 cm/s MV Peak grad:  14.7 mmHg MV Mean grad:  6.0 mmHg     SHUNTS MV Vmax:       1.92 m/s     Systemic VTI:  0.26 m MV Vmean:      117.0 cm/s   Systemic Diam: 2.00 cm MV Decel Time: 286 msec MV E velocity: 76.50 cm/s MV A velocity: 154.00 cm/s MV E/A ratio:  0.50  Lorine Bears MD Electronically signed by Lorine Bears MD Signature Date/Time: 06/05/2023/12:56:37 PM      Final    Assessment  The primary encounter diagnosis was Chronic pain syndrome. Diagnoses of Cervical radiculopathy, Cervical facet joint syndrome, Degeneration of intervertebral disc of lumbar region with discogenic back pain and lower extremity pain, Lumbar spondylosis, and Localized osteoarthrosis of left hip were also pertinent to this visit.  Plan of Care  Medication management to continue with primary care provider Follow-up as needed for interventional pain procedures Follow-up plan:   No follow-ups on file.      Recent Visits Date Type Provider Dept  11/13/23 Office Visit Ladanian Jolly, MD [facility]  Showing recent visits within past 90 days and meeting all other requirements Today's Visits Date Type Provider Dept  01/15/24 Office Visit Danne Jolly, MD [facility]  Showing today's visits and meeting all other requirements Future Appointments No visits were found meeting these conditions. Showing future appointments within next 90 days and meeting all other requirements  I discussed the assessment and treatment plan with the patient. The patient was provided an opportunity to ask questions and all were answered. The patient agreed with the plan and demonstrated an understanding of the instructions.  Patient advised to call back or seek an in-person evaluation if the symptoms or  condition worsens.  Duration of encounter: .  Note by: Achilles Jolly, MD Date: 01/15/2024; Time: 11:17 AM

## 2024-01-24 ENCOUNTER — Encounter: Payer: Medicare (Managed Care) | Admitting: Student in an Organized Health Care Education/Training Program

## 2024-03-18 ENCOUNTER — Other Ambulatory Visit: Payer: Self-pay | Admitting: Family Medicine

## 2024-03-18 DIAGNOSIS — R634 Abnormal weight loss: Secondary | ICD-10-CM

## 2024-03-18 DIAGNOSIS — F17200 Nicotine dependence, unspecified, uncomplicated: Secondary | ICD-10-CM

## 2024-04-04 ENCOUNTER — Ambulatory Visit: Payer: Medicare (Managed Care)

## 2024-04-04 ENCOUNTER — Ambulatory Visit
Admission: RE | Admit: 2024-04-04 | Discharge: 2024-04-04 | Disposition: A | Discharge status: Home / Self Care | Payer: Medicare (Managed Care) | Source: Ambulatory Visit | Attending: Family Medicine | Admitting: Family Medicine

## 2024-04-04 DIAGNOSIS — F17200 Nicotine dependence, unspecified, uncomplicated: Secondary | ICD-10-CM | POA: Insufficient documentation

## 2024-04-04 DIAGNOSIS — R634 Abnormal weight loss: Secondary | ICD-10-CM | POA: Diagnosis present

## 2024-04-04 LAB — POCT I-STAT CREATININE: Creatinine, Ser: 1.3 mg/dL — ABNORMAL HIGH (ref 0.61–1.24)

## 2024-04-04 MED ORDER — IOHEXOL 300 MG/ML  SOLN
100.0000 mL | Freq: Once | INTRAMUSCULAR | Status: AC | PRN
  Administered 2024-04-04: 100 mL via INTRAVENOUS

## 2024-04-11 ENCOUNTER — Other Ambulatory Visit: Payer: Self-pay | Admitting: Student

## 2024-04-11 DIAGNOSIS — H90A21 Sensorineural hearing loss, unilateral, right ear, with restricted hearing on the contralateral side: Secondary | ICD-10-CM

## 2024-05-01 NOTE — CV Procedure (Signed)
  Device system confirmed to be MRI conditional, with implant date > 6 weeks ago, and no evidence of abandoned or epicardial leads in review of most recent CXR  Device last cleared by EP Provider: Creighton Doffing 05/01/24  Clearance is good through for 1 year as long as parameters remain stable at time of check. If pt undergoes a cardiac device procedure during that time, they should be re-cleared.   Tachy-therapies to be programmed off if applicable with device back to pre-MRI settings after completion of exam.  [employer] - Programming recommendation received through [employer] App/Tablet  Arlys Lamer, RT  05/01/2024 1:33 PM

## 2024-05-06 ENCOUNTER — Ambulatory Visit: Payer: Medicare (Managed Care) | Admitting: Pulmonary Disease

## 2024-05-09 ENCOUNTER — Ambulatory Visit (HOSPITAL_COMMUNITY)
Admission: RE | Admit: 2024-05-09 | Discharge: 2024-05-09 | Disposition: A | Discharge status: Home / Self Care | Payer: Medicare (Managed Care) | Source: Ambulatory Visit | Attending: Student | Admitting: Student

## 2024-05-09 ENCOUNTER — Encounter (HOSPITAL_COMMUNITY): Payer: Self-pay

## 2024-05-29 ENCOUNTER — Other Ambulatory Visit: Payer: Self-pay | Admitting: Family Medicine

## 2024-05-29 DIAGNOSIS — I712 Thoracic aortic aneurysm, without rupture, unspecified: Secondary | ICD-10-CM

## 2024-05-29 DIAGNOSIS — R911 Solitary pulmonary nodule: Secondary | ICD-10-CM

## 2024-06-05 NOTE — Progress Notes (Signed)
 [facility] - Tailored Care Management Navigation   Thank you for contacting [facility] for Tailored Plan navigation.   Your patient, Andres Torres, is eligible to receive care management through a Medicaid tailored care management program [facility] . This patient is not eligible for services through [facility] because they will receive services through their Medicaid tailored care management program. Care Coordinator was unable to reach patient to provide their assigned tailored care manager and contact information listed above. Unable to provide information regarding assigned tailored care manager. The Care Coordinator also sent the patient a letter to his home with this information.   [facility] Care Coordinator         [address], [address]; [place], [place] [address] l  [email or web address] l [phone]

## 2024-06-05 NOTE — Progress Notes (Signed)
 PATIENT PROFILE: Andres Torres is a 73 y.o. male who presents to the [facility] GI department for consultation at the request of Dr. Eleanore for evaluation of heme + stool.  HISTORY OF PRESENT ILLNESS: Andres Torres reports he is not feeling too good today- states he fell in the shower last night- did not hit his head and was able to hold on the grab bar prior to landing on his side. Having some left hip and right shoulder pain. Notes some intermittent dizziness.  Feels sore all over. States he has lost 30lb over the last 81m and having trouble moving his bowels- states his bms are hard, inconsistent and lumpy over the last month. Not taking any fiber/stool softeners. Denies any melena/hematocehzia. Occasional irregular abdominal pain. Had one incident of nausea but no vomiting. Denies gerd/dysphagia but states he is just not hungry. He has recently started drinking a nutritional supplement. Denies melena/hematochezia and further GI concerns. 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 No acute findings in the chest, abdomen, or pelvis. Specifically,  no findings to explain the patient's history of weight loss.  2. 4 mm right lung nodule. No follow-up needed if patient is  low-risk (and has no known or suspected primary neoplasm).  Non-contrast chest CT can be considered in 12 months if patient is  high-risk. This recommendation follows the consensus statement:  Guidelines for Management of Incidental Pulmonary Nodules Detected  on CT Images: From the Fleischner Society 2017; Radiology 2017;  284:228-243.  3. Ascending thoracic aorta measures  4 cm diameter. Recommend annual  imaging followup by CTA or MRA. This recommendation follows 2010  ACCF/AHA/AATS/ACR/ASA/SCA/SCAI/SIR/STS/SVM Guidelines for the  Diagnosis and Management of Patients with Thoracic Aortic Disease.  Circulation. 2010; 121: Z733-z630. Aortic aneurysm NOS (ICD10-I71.9)  4. Prostatomegaly.  5. Aortic Atherosclerosis (ICD10-I70.0) and Emphysema (ICD10-J43.9).   Last colonoscopy:none Last endoscopy:none  GENERAL REVIEW OF SYSTEMS:   10 systems reviewed, unremarkable other than what is written above.   MEDICATIONS: Outpatient Encounter Medications as of 06/05/2024  Medication Sig Dispense Refill  . albuterol  90 mcg/actuation inhaler Inhale 2 inhalations into the lungs every 6 (six) hours as needed for Wheezing.    . ascorbic acid , vitamin C , (VITAMIN C ) 250 MG tablet Take 250 mg by mouth once daily       . aspirin 81 MG EC tablet Take 81 mg by mouth once daily    . atorvastatin  (LIPITOR) 40 MG tablet Take 40 mg by mouth once daily    . cholecalciferol (VITAMIN D3) 2,000 unit tablet Take 2,000 Units by mouth once daily    . co-enzyme Q-10, ubiquinone, 200 mg capsule Take 1 capsule by mouth once daily    . fluticasone  (FLONASE ) 50 mcg/actuation nasal spray 2 sprays by Each Nare route daily.    . HYDROcodone -acetaminophen  (NORCO) 7.5-325 mg tablet     . multivitamin with minerals tablet Take 1 tablet by mouth once daily       . tamsulosin (FLOMAX) 0.4 mg capsule      No facility-administered encounter medications on file as of 06/05/2024.    ALLERGIES: Patient has no known allergies.  PAST MEDICAL HISTORY:  Past Medical History:  Diagnosis Date  . Chronic pain syndrome   . Depression   . Diabetes mellitus type 2, uncomplicated (CMS/HHS-HCC)     PAST SURGICAL HISTORY: Past Surgical History:  Procedure Laterality Date  . LENS EYE SURGERY Left 06/24/13  . FOOT SURGERY Right   . WISDOM TEETH       FAMILY HISTORY: Family History  Problem Relation  Name Age of Onset  . High blood pressure (Hypertension) Mother    . Diabetes type II Father       SOCIAL HISTORY: Social History   Socioeconomic History  . Marital status: Widowed  Tobacco Use  . Smoking status: Former    Current packs/day: 0.00    Types: Cigarettes    Quit date: 09/14/2015    Years since quitting: 8.7  . Smokeless tobacco: Never  Substance and Sexual Activity  . Alcohol use: No    Alcohol/week: 0.0 standard drinks of alcohol  . Drug use: No  . Sexual activity: Yes    Partners: Female   Social Drivers of Corporate investment banker Strain: High Risk (06/05/2024)   Overall Financial Resource Strain (CARDIA)   . Difficulty of Paying Living Expenses: Hard  Food Insecurity: Food Insecurity Present (06/05/2024)   Hunger Vital Sign   . Worried About Programme researcher, broadcasting/film/video in the Last Year: Sometimes true   . Ran Out of Food in the Last Year: Sometimes true  Transportation Needs: Unmet Transportation Needs (06/05/2024)   PRAPARE - Transportation   . Lack of Transportation (Medical): Yes   . Lack of Transportation (Non-Medical): Yes    PHYSICAL EXAM: Vitals:   06/05/24 0923  BP: (!) 143/74  Pulse: 74  Temp: 36.5 C (97.7 F)   Body mass index is 21.56 kg/m. Weight: 71.1 kg (156 lb 12.8 oz)   General Appearance:    Alert, cooperative, no distress, appears stated age  Head:     Atraumatic, normocephalic  Eyes:   Anciteric  Neck:   Supple, symmetrical, trachea midline, no adenopathy, no thyroid enlargement; no JVD  Throat:   Lips, mucosa, and tongue normal  Lungs:     Clear to auscultation bilaterally, respirations unlabored   Heart:    Regular rate and rhythm, S1 and S2 normal, no murmur, rub   or gallop  Abdomen:     Soft, non-tender, bowel sounds active all four quadrants,    no masses, no organomegaly  Extremities:   Extremities normal, atraumatic, no cyanosis or edema  Skin:   Skin color, texture, turgor normal, no rashes or lesions   Neurologic:    Grossly intact    REVIEW OF DATA: I have reviewed the following data today: Hospital notes imaging labs  ASSESSMENT AND PLAN: Andres Torres is a 73 y.o. male presenting for consultation for weight loss, heme + stool, decreased appetite, constipation.  He had some housing needs so help referral placed; agree with nutritional supplement. Cbc/cmp today. We discussed his lung nodule - says he has follow up planned. Also dicussed further eval regarding his recent fall however he declined- says if he doesn't feel better soon he will seek care. Recommend 2 doses of miralax/d for his constipation & teaching sheets provided. Schedule egd/colonoscopy.  Procedure information given: indications, benefits, risks- including, but not limited to bleeding, infection, perforation, difficulty with sedation, were discussed with the patient, and they are agreeable to the procedures.  States he has family here who can help him. Follow up as needed after  procedure, sooner if problems

## 2024-06-19 NOTE — CV Procedure (Signed)
  Device system confirmed to be MRI conditional, with implant date > 6 weeks ago, and no evidence of abandoned or epicardial leads in review of most recent CXR  Device last cleared by EP Provider: Daphne Barrack 06/19/24  Clearance is good through for 1 year as long as parameters remain stable at time of check. If pt undergoes a cardiac device procedure during that time, they should be re-cleared.   Tachy-therapies to be programmed off if applicable with device back to pre-MRI settings after completion of exam.  Medtronic - Programming recommendation received through Medtronic App/Tablet  Rocky Catalan, RT  06/19/2024 2:34 PM

## 2024-06-26 ENCOUNTER — Ambulatory Visit (HOSPITAL_COMMUNITY)
Admission: RE | Admit: 2024-06-26 | Discharge: 2024-06-26 | Disposition: A | Discharge status: Home / Self Care | Source: Ambulatory Visit | Attending: Student | Admitting: Student

## 2024-06-26 ENCOUNTER — Other Ambulatory Visit: Payer: Self-pay | Admitting: Student

## 2024-06-26 DIAGNOSIS — H90A21 Sensorineural hearing loss, unilateral, right ear, with restricted hearing on the contralateral side: Secondary | ICD-10-CM

## 2024-06-26 MED ORDER — GADOBUTROL 1 MMOL/ML IV SOLN: 7.0000 mL | Freq: Once | INTRAVENOUS | Status: DC | PRN

## 2024-06-30 ENCOUNTER — Encounter: Payer: Self-pay | Admitting: Pulmonary Disease

## 2024-06-30 ENCOUNTER — Ambulatory Visit (INDEPENDENT_AMBULATORY_CARE_PROVIDER_SITE_OTHER): Admitting: Pulmonary Disease

## 2024-06-30 VITALS — BP 142/72 | HR 68 | Temp 98.1°F | Ht 71.5 in | Wt 155.6 lb

## 2024-06-30 DIAGNOSIS — Z950 Presence of cardiac pacemaker: Secondary | ICD-10-CM | POA: Diagnosis not present

## 2024-06-30 DIAGNOSIS — R0602 Shortness of breath: Secondary | ICD-10-CM

## 2024-06-30 DIAGNOSIS — J449 Chronic obstructive pulmonary disease, unspecified: Secondary | ICD-10-CM

## 2024-06-30 DIAGNOSIS — J439 Emphysema, unspecified: Secondary | ICD-10-CM

## 2024-06-30 MED ORDER — TRELEGY ELLIPTA 100-62.5-25 MCG/ACT IN AEPB: 1.0000 | INHALATION_SPRAY | Freq: Every day | RESPIRATORY_TRACT | 0 refills | Status: AC

## 2024-06-30 NOTE — Patient Instructions (Signed)
 VISIT SUMMARY:  During your visit, we discussed your worsening shortness of breath, recent weight loss, and challenges with nicotine  dependence. 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 We also talked about your current use of inhalers and explored additional testing to better understand your condition.  YOUR PLAN:  -CHRONIC OBSTRUCTIVE PULMONARY DISEASE (COPD)/EMPHYSEMA: COPD/emphysema is a chronic lung condition that makes it hard to breathe. We will conduct pulmonary function tests to assess the severity and progression of your condition. You will receive a sample inhaler to use once daily, and it's important to rinse your mouth after each use. Please monitor your response to the inhaler and report back after using the first box.  -NICOTINE  DEPENDENCE, CURRENT: Nicotine  dependence is an addiction to tobacco products. You have recently stopped smoking, which is a significant step. We understand the challenges of nicotine  withdrawal and will support you through this process.  -UNINTENTIONAL WEIGHT LOSS: Unintentional weight loss can be a sign of an underlying health issue. You have reported a 30-pound weight loss and decreased appetite. We will monitor this closely to determine the cause and address any potential concerns.  INSTRUCTIONS:  Please follow up after you have completed the first box of the sample inhaler to discuss your response. Additionally, we will schedule pulmonary function tests to better understand your lung health.

## 2024-06-30 NOTE — Progress Notes (Signed)
 Subjective:    Patient ID: Andres Torres, male    DOB: 06-06-1951, 73 y.o.   MRN: [id number]  Patient Care Team: Eleanore Ronal RAMAN, MD as PCP - General [facility] Medicine)  Chief Complaint  Patient presents with   Consult    Occasional cough and wheezing.     BACKGROUND: Patient is a 73 year old former smoker who presents for evaluation of shortness of breath, cough and wheezing.  He is kindly referred by Dr. Ronal Eleanore.   HPI Discussed the use of AI scribe software for clinical note transcription with the patient, who gave verbal consent to proceed.  History of Present Illness   Andres Torres is a 73 year old male with COPD/emphysema who presents with worsening shortness of breath.  He experiences worsening shortness of breath, particularly when climbing stairs, getting up and down, and walking more than a few yards. The breathing difficulty is described as 'exhausting' and started around 2021, approximately a year after receiving a leadless pacemaker in 2020. He occasionally experiences light wheezing, usually in the early morning.  He has experienced a significant weight loss of 30 pounds. He attributes this partly to a lack of appetite.  This is currently being investigated.  CT chest performed 04 Apr 2024 did not show any findings to explain weight loss.  He does have emphysema noted.  He has a history of heart issues and had a leadless pacemaker placed in 2020, which was checked recently.  He has had CT chest abdomen and pelvis as well as MRI of the brain recently.  He describes the MRI experience as mentally disorienting, affecting his memory and causing distress due to the loud and unfamiliar sounds.  No history of COVID-19, having been tested multiple times with negative results. He has a history of smoking, which he quit three months ago, but finds it challenging due to nicotine  addiction. He has not been using his prescribed inhaler, Incruse, regularly as he is exploring naturopathic  methods to improve his lung health.  His occupational history includes work as a Engineer, production and an Programmer, systems, with no clear history of exposure to asbestos, although he mentions the difficulty in determining exposure due to various environments.     Review of Systems A 10 point review of systems was performed and it is as noted above otherwise negative.   Past Medical History:  Diagnosis Date   Diabetes mellitus without complication (HCC)    Hypertension    Sick sinus syndrome (HCC)     Past Surgical History:  Procedure Laterality Date   PACEMAKER LEADLESS INSERTION N/A 08/28/2019   Procedure: PACEMAKER LEADLESS INSERTION;  Surgeon: Ammon Blunt, MD;  Location: [facility] INVASIVE CV LAB;  Service: Cardiovascular;  Laterality: N/A;   TEMPORARY PACEMAKER Right 08/28/2019   Procedure: TEMPORARY PACEMAKER;  Surgeon: Ammon Blunt, MD;  Location: [facility] INVASIVE CV LAB;  Service: Cardiovascular;  Laterality: Right;  RIJ    Patient Active Problem List   Diagnosis Date Noted   CVA (cerebral vascular accident) (HCC) 06/04/2023   Chest pain 06/04/2023   Diabetes mellitus type 2, controlled, without complications (HCC) 06/04/2023   Dyslipidemia 06/04/2023   Essential hypertension 06/04/2023   Chronic pain of left knee 03/08/2021   Arthritis of left knee 03/08/2021   Cervical radiculopathy 03/08/2021   CHB (complete heart block) (HCC) 08/28/2019   Complete heart block (HCC) 08/09/2019   Lumbar spondylosis 07/03/2018   Lumbar facet joint pain 07/03/2018   Cervical facet joint syndrome 07/03/2018   Chronic pain  syndrome 04/23/2018   Lumbar degenerative disc disease 04/23/2018   Chronic right shoulder pain 04/23/2018   Musculoskeletal pain 04/23/2018   Pharmacologic therapy 04/23/2018   Neck pain 04/23/2018   Localized osteoarthrosis of left hip 10/22/2013   Foot arch pain 10/22/2013    Family History  Problem Relation Age of Onset   Arthritis Mother    Diabetes Father     Kidney disease Father     Social History   Tobacco Use   Smoking status: Former    Current packs/day: 0.00    Types: Cigarettes    Quit date: 03/24/2018    Years since quitting: 6.2   Smokeless tobacco: Never  Substance Use Topics   Alcohol use: Not Currently    No Known Allergies  Current Meds  Medication Sig   albuterol  (VENTOLIN  HFA) 108 (90 Base) MCG/ACT inhaler Inhale 2 puffs into the lungs every 4 (four) hours as needed for wheezing or shortness of breath.   Ascorbic Acid  (VITAMIN C ) 1000 MG tablet Take 1,000 mg by mouth daily.   atorvastatin  (LIPITOR) 40 MG tablet Take 40 mg by mouth at bedtime.   Fluticasone -Umeclidin-Vilant (TRELEGY ELLIPTA ) 100-62.5-25 MCG/ACT AEPB Inhale 1 puff into the lungs daily in the afternoon.   Garlic 400 MG TBEC Take 1 tablet by mouth as directed.   Multiple Vitamin (MULTIVITAMIN WITH MINERALS) TABS tablet Take 1 tablet by mouth daily.   sitaGLIPtin (JANUVIA) 100 MG tablet Take 100 mg by mouth daily.   [DISCONTINUED] umeclidinium bromide (INCRUSE ELLIPTA) 62.5 MCG/ACT AEPB Inhale 1 puff into the lungs daily.     There is no immunization history on file for this patient.      Objective:     BP (!) 142/72 (BP Location: Left Arm, Patient Position: Sitting, Cuff Size: Normal)   Pulse 68   Temp 98.1 F (36.7 C) (Oral)   Ht 5' 11.5 (1.816 m)   Wt 155 lb 9.6 oz (70.6 kg)   SpO2 98%   BMI 21.40 kg/m   SpO2: 98 %  GENERAL: Thin gentleman, no acute distress, presents in transport chair.  No conversational dyspnea. HEAD: Normocephalic, atraumatic.  EYES: Pupils equal, round, reactive to light.  No scleral icterus.  MOUTH: Oral mucosa moist.  No thrush. NECK: Supple. No thyromegaly. Trachea midline. No JVD.  No adenopathy. PULMONARY: Good air entry bilaterally.  Coarse, otherwise, no adventitious sounds. CARDIOVASCULAR: S1 and S2. Regular rate and rhythm.  No rubs, murmurs or gallops heard. ABDOMEN: Benign. MUSCULOSKELETAL: No joint  deformity, no clubbing, no edema.  NEUROLOGIC: No overt focal deficit, no gait disturbance, speech is fluent. SKIN: Intact,warm,dry. PSYCH: Flat affect, normal behavior  Ambulatory oxymetry was performed today:  At rest on room air oxygen saturation was 100%, the patient ambulated at a slow pace, completed 2 laps, unable to complete third lap due to hip pain.  O2 nadir 99%, mild shortness of breath.  Resting heart rate was 75 bpm at maximum for this exercise 88 bpm. No clinically significant oxygen desaturations were noted.  Patient maintained good oxygenation throughout.  Exercise capacity limited by pain/hip discomfort.      Assessment & Plan:     ICD-10-CM   1. Shortness of breath  R06.02 Pulmonary function test    2. Pulmonary emphysema, unspecified emphysema type (HCC)  J43.9 Pulmonary function test    3. COPD suggested by initial evaluation (HCC)  J44.9 Pulmonary function test    4. S/P placement of leadless cardiac pacemaker  Z95.0  08/28/2019 Dr. Ammon  Medtronic [id number]      Orders Placed This Encounter  Procedures   Pulmonary function test    Standing Status:   Future    Expiration Date:   06/30/2025    Where should this test be performed?:   Outpatient Pulmonary    What type of PFT is being ordered?:   Full PFT    Meds ordered this encounter  Medications   Fluticasone -Umeclidin-Vilant (TRELEGY ELLIPTA ) 100-62.5-25 MCG/ACT AEPB    Sig: Inhale 1 puff into the lungs daily in the afternoon.    Dispense:  2 each    Refill:  0   Discussion:    Chronic obstructive pulmonary disease (COPD)/emphysema Progressive dyspnea, particularly with exertion, since 2022. Symptoms include morning wheezing. Limited use of current inhaler (Incruse). Recent smoking cessation may influence symptomatology. Oxygen saturation remains stable during ambulation. - Order pulmonary function tests to assess severity and progression - Provide sample inhaler for trial, one puff daily  (Trelegy Ellipta  100) - Instruct to rinse mouth after inhaler use - Monitor response to inhaler and report after using second box  Nicotine  dependence, in early remission Recently ceased smoking three months ago. Acknowledges difficulty with nicotine  withdrawal.  Unintentional weight loss Reported weight loss of 30 pounds with decreased appetite.      Advised if symptoms do not improve or worsen, to please contact office for sooner follow up or seek emergency care.    I spent 60 minutes of dedicated to the care of this patient on the date of this encounter to include pre-visit review of records, face-to-face time with the patient discussing conditions above, post visit ordering of testing, clinical documentation with the electronic health record, making appropriate referrals as documented, and communicating necessary findings to members of the patients care team.   C. Leita Sanders, MD Advanced Bronchoscopy PCCM [facility]    *This note was dictated using voice recognition software/Dragon.  Despite best efforts to proofread, errors can occur which can change the meaning. Any transcriptional errors that result from this process are unintentional and may not be fully corrected at the time of dictation.

## 2024-07-03 ENCOUNTER — Emergency Department

## 2024-07-03 ENCOUNTER — Other Ambulatory Visit: Payer: Self-pay

## 2024-07-03 ENCOUNTER — Emergency Department
Admission: EM | Admit: 2024-07-03 | Discharge: 2024-07-03 | Disposition: A | Discharge status: Home / Self Care | Attending: Emergency Medicine | Admitting: Emergency Medicine

## 2024-07-03 ENCOUNTER — Encounter: Payer: Self-pay | Admitting: Emergency Medicine

## 2024-07-03 DIAGNOSIS — I10 Essential (primary) hypertension: Secondary | ICD-10-CM | POA: Insufficient documentation

## 2024-07-03 DIAGNOSIS — J441 Chronic obstructive pulmonary disease with (acute) exacerbation: Secondary | ICD-10-CM | POA: Diagnosis not present

## 2024-07-03 DIAGNOSIS — E871 Hypo-osmolality and hyponatremia: Secondary | ICD-10-CM | POA: Insufficient documentation

## 2024-07-03 DIAGNOSIS — Z950 Presence of cardiac pacemaker: Secondary | ICD-10-CM | POA: Insufficient documentation

## 2024-07-03 DIAGNOSIS — E119 Type 2 diabetes mellitus without complications: Secondary | ICD-10-CM | POA: Insufficient documentation

## 2024-07-03 DIAGNOSIS — R0789 Other chest pain: Secondary | ICD-10-CM | POA: Diagnosis present

## 2024-07-03 LAB — CBC
HCT: 43.6 % (ref 39.0–52.0)
Hemoglobin: 15.1 g/dL (ref 13.0–17.0)
MCH: 32.3 pg (ref 26.0–34.0)
MCHC: 34.6 g/dL (ref 30.0–36.0)
MCV: 93.2 fL (ref 80.0–100.0)
Platelets: 279 K/uL (ref 150–400)
RBC: 4.68 MIL/uL (ref 4.22–5.81)
RDW: 13.3 % (ref 11.5–15.5)
WBC: 9.4 K/uL (ref 4.0–10.5)
nRBC: 0 % (ref 0.0–0.2)

## 2024-07-03 LAB — TROPONIN I (HIGH SENSITIVITY)
Troponin I (High Sensitivity): 8 ng/L (ref <18)
Troponin I (High Sensitivity): 7 ng/L (ref <18)

## 2024-07-03 LAB — BASIC METABOLIC PANEL WITH GFR
Anion gap: 11 (ref 5–15)
BUN: 18 mg/dL (ref 8–23)
CO2: 25 mmol/L (ref 22–32)
Calcium: 9.8 mg/dL (ref 8.9–10.3)
Chloride: 98 mmol/L (ref 98–111)
Creatinine, Ser: 1.03 mg/dL (ref 0.61–1.24)
GFR, Estimated: >60 mL/min (ref >60)
Glucose, Bld: 146 mg/dL — ABNORMAL HIGH (ref 70–99)
Potassium: 4.2 mmol/L (ref 3.5–5.1)
Sodium: 134 mmol/L — ABNORMAL LOW (ref 135–145)

## 2024-07-03 LAB — CBG MONITORING, ED: Glucose-Capillary: 132 mg/dL — ABNORMAL HIGH (ref 70–99)

## 2024-07-03 MED ORDER — OXYCODONE-ACETAMINOPHEN 5-325 MG PO TABS
1.0000 | ORAL_TABLET | Freq: Once | ORAL | Status: AC
  Administered 2024-07-03: 1 via ORAL
  Filled 2024-07-03: qty 1

## 2024-07-03 NOTE — Discharge Instructions (Signed)
 The interrogation of your pacemaker shows that it is functioning normally.  Call [facility] to make a follow-up appointment with cardiology within the next few days.  Return to the ER for new, worsening, or persistent severe chest pain, shortness of breath, weakness or lightheadedness, or any other new or worsening symptoms that concern you.

## 2024-07-03 NOTE — ED Triage Notes (Signed)
 Pt presents to the ED via POV with complaints of CP over his pacemaker (Medtronic) x 4 days. He notes having an MRI on Monday and since that time he has had some pain over his pacemaker site. He notes having a fall today trying to get into the tube - he notes slipping/losing his balance. Denies hitting his head, no LOC, not on thinners. A&Ox4 at this time.

## 2024-07-03 NOTE — ED Provider Notes (Addendum)
 [facility] Provider Note    Event Date/Time   First MD Initiated Contact with Patient 07/03/24 1835     (approximate)   History   Chest Pain   HPI  Andres Torres is a 73 y.o. male with a history of COPD, diabetes, sinus syndrome status post pacemaker, and hypertension who presents with chest pain over the last week, intermittent, occurring several times throughout the day.  He describes it as an aching pain, occurring near his pacemaker site over his heart, lasting about 30 seconds at a time, and then resolving.  When it happens it is associated with some lightheadedness and shortness of breath.  He states he feels disoriented.  Subsequently will resolve.  He has no associated nausea or vomiting.  He states that this happens several times throughout the day.  The symptoms seemed to start after he had an MRI done a week ago.  He denies any leg swelling.  I reviewed the past medical records.  The patient was most recently evaluated by Dr. Tamea from pulmonology on 8/18 for evaluation of shortness of breath thought to be due to COPD.   Physical Exam   Triage Vital Signs: ED Triage Vitals  Encounter Vitals Group     BP 07/03/24 1650 (!) 143/84     Girls Systolic BP Percentile --      Girls Diastolic BP Percentile --      Boys Systolic BP Percentile --      Boys Diastolic BP Percentile --      Pulse Rate 07/03/24 1650 70     Resp 07/03/24 1650 20     Temp 07/03/24 1650 97.8 F (36.6 C)     Temp Source 07/03/24 1650 Oral     SpO2 07/03/24 1650 99 %     Weight 07/03/24 1648 160 lb (72.6 kg)     Height 07/03/24 1648 5' 11.5 (1.816 m)     Head Circumference --      Peak Flow --      Pain Score 07/03/24 1648 7     Pain Loc --      Pain Education --      Exclude from Growth Chart --     Most recent vital signs: Vitals:   07/03/24 1650 07/03/24 1900  BP: (!) 143/84 139/73  Pulse: 70 66  Resp: 20 19  Temp: 97.8 F (36.6 C)   SpO2: 99% 97%      General: Alert, comfortable appearing, no distress.  CV:  Good peripheral perfusion.  Normal heart sounds. Resp:  Normal effort.  Lungs CTAB. Abd:  No distention.  Other:  No peripheral edema   ED Results / Procedures / Treatments   Labs (all labs ordered are listed, but only abnormal results are displayed) Labs Reviewed  BASIC METABOLIC PANEL WITH GFR - Abnormal; Notable for the following components:      Result Value   Sodium 134 (*)    Glucose, Bld 146 (*)    All other components within normal limits  CBG MONITORING, ED - Abnormal; Notable for the following components:   Glucose-Capillary 132 (*)    All other components within normal limits  CBC  TROPONIN I (HIGH SENSITIVITY)  TROPONIN I (HIGH SENSITIVITY)     EKG  ED ECG REPORT I, Waylon Cassis, the attending physician, personally viewed and interpreted this ECG.  Date: 07/03/2024 EKG Time: 1652 Rate: 74 Rhythm: Paced rhythm QRS Axis: normal Intervals: normal ST/T Wave abnormalities: normal  Narrative Interpretation: no evidence of acute ischemia    RADIOLOGY  Chest x-ray: I independently viewed and interpreted the images; there is no focal consolidation or edema   PROCEDURES:  Critical Care performed: No  Procedures   MEDICATIONS ORDERED IN ED: Medications  oxyCODONE -acetaminophen  (PERCOCET/ROXICET) 5-325 MG per tablet 1 tablet (1 tablet Oral Given 07/03/24 1947)     IMPRESSION / MDM / ASSESSMENT AND PLAN / ED COURSE  I reviewed the triage vital signs and the nursing notes.  73 year old male with PMH as noted above presents with atypical intermittent chest pain over the last week after he got an MRI.  On exam today, the vital signs are normal.  Physical exam is unremarkable for acute findings.  Differential diagnosis includes, but is not limited to, musculoskeletal pain, GERD, ACS, cardiac dysrhythmia.  There is no clinical evidence for PE.  There is no evidence of aortic dissection or  other vascular etiology.  EKG shows unremarkable paced rhythm.  Will obtain lab workup, chest x-ray, interrogate the pacemaker, and reassess.  Patient's presentation is most consistent with acute presentation with potential threat to life or bodily function.  The patient is on the cardiac monitor to evaluate for evidence of arrhythmia and/or significant heart rate changes.   ----------------------------------------- 9:10 PM on 07/03/2024 -----------------------------------------  The patient has remained asymptomatic.  Troponins are negative x 2.  BMP and CBC show no acute findings.  The pacemaker was interrogated.  The report indicates device appears to be functioning as programmed..  It is a leadless pacemaker with no arrhythmia detection.  I consulted and discussed the case with Dr. Florencio from cardiology who advised that given the negative workup he would recommend discharge with close outpatient clinic follow-up within the next several days.  The patient is in agreement with this plan.  He feels comfortable going home.  He understands he is to follow-up with cardiology within the next several days.  I gave strict return precautions and he expressed understanding.   FINAL CLINICAL IMPRESSION(S) / ED DIAGNOSES   Final diagnoses:  Atypical chest pain     Rx / DC Orders   ED Discharge Orders     None        Note:  This document was prepared using Dragon voice recognition software and may include unintentional dictation errors.    Jacolyn Pae, MD 07/03/24 DAVIE    Jacolyn Pae, MD 07/03/24 2112

## 2024-07-15 ENCOUNTER — Encounter: Payer: Self-pay | Admitting: Pulmonary Disease

## 2024-07-25 ENCOUNTER — Ambulatory Visit: Admitting: Neurosurgery

## 2024-08-01 ENCOUNTER — Ambulatory Visit: Admission: RE | Admit: 2024-08-01 | Source: Home / Self Care | Admitting: Gastroenterology

## 2024-08-01 SURGERY — COLONOSCOPY
Anesthesia: General

## 2024-08-28 ENCOUNTER — Encounter: Payer: Self-pay | Admitting: Gastroenterology

## 2024-08-29 ENCOUNTER — Ambulatory Visit: Admission: RE | Admit: 2024-08-29 | Source: Home / Self Care | Admitting: Gastroenterology

## 2024-08-29 ENCOUNTER — Encounter: Admission: RE | Payer: Self-pay | Source: Home / Self Care

## 2024-08-29 SURGERY — COLONOSCOPY
Anesthesia: General

## 2024-09-30 ENCOUNTER — Encounter

## 2024-09-30 ENCOUNTER — Emergency Department
Admission: EM | Admit: 2024-09-30 | Discharge: 2024-09-30 | Disposition: A | Discharge status: Home / Self Care | Attending: Emergency Medicine | Admitting: Emergency Medicine

## 2024-09-30 ENCOUNTER — Emergency Department

## 2024-09-30 ENCOUNTER — Ambulatory Visit: Admitting: Pulmonary Disease

## 2024-09-30 ENCOUNTER — Other Ambulatory Visit: Payer: Self-pay

## 2024-09-30 DIAGNOSIS — D72829 Elevated white blood cell count, unspecified: Secondary | ICD-10-CM | POA: Insufficient documentation

## 2024-09-30 DIAGNOSIS — E119 Type 2 diabetes mellitus without complications: Secondary | ICD-10-CM | POA: Insufficient documentation

## 2024-09-30 DIAGNOSIS — R0789 Other chest pain: Secondary | ICD-10-CM | POA: Diagnosis present

## 2024-09-30 DIAGNOSIS — Z950 Presence of cardiac pacemaker: Secondary | ICD-10-CM | POA: Diagnosis not present

## 2024-09-30 DIAGNOSIS — I10 Essential (primary) hypertension: Secondary | ICD-10-CM | POA: Diagnosis not present

## 2024-09-30 DIAGNOSIS — R079 Chest pain, unspecified: Secondary | ICD-10-CM

## 2024-09-30 HISTORY — DX: Neoplasm of unspecified behavior of brain: D49.6

## 2024-09-30 LAB — CBC
HCT: 39.4 % (ref 39.0–52.0)
Hemoglobin: 13.7 g/dL (ref 13.0–17.0)
MCH: 32.4 pg (ref 26.0–34.0)
MCHC: 34.8 g/dL (ref 30.0–36.0)
MCV: 93.1 fL (ref 80.0–100.0)
Platelets: 275 K/uL (ref 150–400)
RBC: 4.23 MIL/uL (ref 4.22–5.81)
RDW: 13.5 % (ref 11.5–15.5)
WBC: 11.5 K/uL — ABNORMAL HIGH (ref 4.0–10.5)
nRBC: 0 % (ref 0.0–0.2)

## 2024-09-30 LAB — BASIC METABOLIC PANEL WITH GFR
Anion gap: 10 (ref 5–15)
BUN: 9 mg/dL (ref 8–23)
CO2: 26 mmol/L (ref 22–32)
Calcium: 9.3 mg/dL (ref 8.9–10.3)
Chloride: 97 mmol/L — ABNORMAL LOW (ref 98–111)
Creatinine, Ser: 0.93 mg/dL (ref 0.61–1.24)
GFR, Estimated: >60 mL/min (ref >60)
Glucose, Bld: 114 mg/dL — ABNORMAL HIGH (ref 70–99)
Potassium: 4.3 mmol/L (ref 3.5–5.1)
Sodium: 133 mmol/L — ABNORMAL LOW (ref 135–145)

## 2024-09-30 LAB — TROPONIN T, HIGH SENSITIVITY
Troponin T High Sensitivity: 16 ng/L (ref 0–19)
Troponin T High Sensitivity: 16 ng/L (ref 0–19)

## 2024-09-30 MED ORDER — ACETAMINOPHEN 325 MG PO TABS
650.0000 mg | ORAL_TABLET | Freq: Once | ORAL | Status: AC
  Administered 2024-09-30: 650 mg via ORAL
  Filled 2024-09-30: qty 2

## 2024-09-30 MED ORDER — OXYCODONE HCL 5 MG PO TABS
10.0000 mg | ORAL_TABLET | Freq: Once | ORAL | Status: AC
  Administered 2024-09-30: 10 mg via ORAL
  Filled 2024-09-30: qty 2

## 2024-09-30 NOTE — ED Provider Notes (Signed)
 [facility] Provider Note   Event Date/Time   First MD Initiated Contact with Patient 09/30/24 1633     (approximate) History  Chest Pain  HPI Andres Torres is a 73 y.o. male with a past medical history of type 2 diabetes, sick sinus syndrome, hypertension, and pacemaker in place who presents complaining of left-sided chest pain that began earlier today while they were trying to be seen at a pulmonology appointment.  Patient states that he has chest pain almost every day however when he was asked if he had chest pain at the appointment he did say yes.  Patient states that he was then rolled to the emergency department despite him wanting to finish his appointment with pulmonology.  Patient states that his pain is relatively well-controlled with Tylenol  however he takes oxycodone  normally at home and requests a dosage of this now. ROS: Patient currently denies any vision changes, tinnitus, difficulty speaking, facial droop, sore throat, shortness of breath, abdominal pain, nausea/vomiting/diarrhea, dysuria, or weakness/numbness/paresthesias in any extremity   Physical Exam  Triage Vital Signs: ED Triage Vitals  Encounter Vitals Group     BP 09/30/24 1505 (!) 148/71     Girls Systolic BP Percentile --      Girls Diastolic BP Percentile --      Boys Systolic BP Percentile --      Boys Diastolic BP Percentile --      Pulse Rate 09/30/24 1505 71     Resp 09/30/24 1505 16     Temp 09/30/24 1505 97.6 F (36.4 C)     Temp Source 09/30/24 1505 Oral     SpO2 --      Weight 09/30/24 1508 156 lb (70.8 kg)     Height 09/30/24 1508 5' 11 (1.803 m)     Head Circumference --      Peak Flow --      Pain Score 09/30/24 1506 7     Pain Loc --      Pain Education --      Exclude from Growth Chart --    Most recent vital signs: Vitals:   09/30/24 1505  BP: (!) 148/71  Pulse: 71  Resp: 16  Temp: 97.6 F (36.4 C)   General: Awake, oriented x4. CV:  Good peripheral  perfusion. Resp:  Normal effort. Abd:  No distention. Other:  Elderly well-developed, well-nourished African-American male resting comfortably in no acute distress ED Results / Procedures / Treatments  Labs (all labs ordered are listed, but only abnormal results are displayed) Labs Reviewed  BASIC METABOLIC PANEL WITH GFR - Abnormal; Notable for the following components:      Result Value   Sodium 133 (*)    Chloride 97 (*)    Glucose, Bld 114 (*)    All other components within normal limits  CBC - Abnormal; Notable for the following components:   WBC 11.5 (*)    All other components within normal limits  TROPONIN T, HIGH SENSITIVITY  TROPONIN T, HIGH SENSITIVITY   EKG ED ECG REPORT I, Artist MARLA Kerns, the attending physician, personally viewed and interpreted this ECG. Date: 09/30/2024 EKG Time: 1505 Rate: 75 Rhythm: Paced rhythm QRS Axis: normal Intervals: normal ST/T Wave abnormalities: normal Narrative Interpretation: Paced rhythm.  No evidence of acute ischemia RADIOLOGY ED MD interpretation: One-view portable chest x-ray interpreted by me shows no evidence of acute abnormalities including no pneumonia, pneumothorax, or widened mediastinum - All radiology independently interpreted and agree with  radiology assessment Official radiology report(s): DG Chest 2 View Result Date: 09/30/2024 CLINICAL DATA:  Left-sided chest pain beginning today. EXAM: CHEST - 2 VIEW COMPARISON:  07/03/2024 FINDINGS: Lungs are adequately inflated and otherwise clear. Loop recorder projects over the left heart. Cardiomediastinal silhouette and remainder of the exam is unchanged. IMPRESSION: No active cardiopulmonary disease. Electronically Signed   By: Toribio Agreste M.D.   On: 09/30/2024 15:51   PROCEDURES: Critical Care performed: No Procedures MEDICATIONS ORDERED IN ED: Medications  oxyCODONE  (Oxy IR/ROXICODONE ) immediate release tablet 10 mg (has no administration in time range)   acetaminophen  (TYLENOL ) tablet 650 mg (650 mg Oral Given 09/30/24 1719)   IMPRESSION / MDM / ASSESSMENT AND PLAN / ED COURSE  I reviewed the triage vital signs and the nursing notes.                             The patient is on the cardiac monitor to evaluate for evidence of arrhythmia and/or significant heart rate changes. Patient's presentation is most consistent with acute presentation with potential threat to life or bodily function. Patient is a 73 year old male with the above-stated past medical history that presents from a pulmonology appointment complaining of left-sided chest pain that is a daily occurrence for him. DDx: ACS, aortic dissection, pneumothorax, pneumonia, esophagitis Plan: CBC, CMP, troponin, chest x-ray, EKG  Radiologic and laboratory evaluation only significant for mild leukocytosis to 11.5.  Patient's troponin is negative x 2.  Patient's chest x-ray shows no evidence of acute abnormalities and EKG shows a ventricular paced rhythm.  Patient shows no red flag symptomatology that requires admission at this time.  Patient states pain is better controlled with his home dose of oxycodone  and Tylenol .  Patient agrees with plan for discharge at this time with pulmonary and cardiology follow-up as needed.  Patient given strict return precautions and all questions answered prior to discharge  Dispo: Discharge home with pulmonology and cardiology follow-up   FINAL CLINICAL IMPRESSION(S) / ED DIAGNOSES   Final diagnoses:  Chest pain, unspecified type   Rx / DC Orders   ED Discharge Orders     None      Note:  This document was prepared using Dragon voice recognition software and may include unintentional dictation errors.   Wyn Nettle K, MD 09/30/24 [phone]

## 2024-09-30 NOTE — ED Triage Notes (Signed)
 Pt arrives via POV with c/o left sided chest pain that started earlier today while they were trying to make an appointment with their pulmonologist and they were brought over here. Pt has a pacemaker and has a hx of heart blockage. Pt is irritable and doesn't understand why we ask so many questions. Pt is A&Ox4 during triage.

## 2024-09-30 NOTE — ED Triage Notes (Signed)
 First nurse note: pt to ED for chest pain radiating to left arm, also reports left arm numbness. LKW 0200 today

## 2024-12-15 ENCOUNTER — Encounter

## 2024-12-16 ENCOUNTER — Ambulatory Visit: Admitting: Pulmonary Disease

## 2025-02-12 ENCOUNTER — Encounter

## 2025-02-12 ENCOUNTER — Ambulatory Visit: Admitting: Pulmonary Disease

## 2025-04-07 ENCOUNTER — Other Ambulatory Visit
